# Patient Record
Sex: Female | Born: 1935 | Race: White | Hispanic: No | Smoking: Never smoker
Health system: Southern US, Community
[De-identification: ages and names within clinical notes are randomized; demographics above are authoritative.]

## PROBLEM LIST (undated history)

## (undated) DIAGNOSIS — Z8489 Family history of other specified conditions: Secondary | ICD-10-CM

## (undated) DIAGNOSIS — M1990 Unspecified osteoarthritis, unspecified site: Secondary | ICD-10-CM

## (undated) DIAGNOSIS — I10 Essential (primary) hypertension: Secondary | ICD-10-CM

## (undated) DIAGNOSIS — Z9889 Other specified postprocedural states: Secondary | ICD-10-CM

## (undated) DIAGNOSIS — R112 Nausea with vomiting, unspecified: Secondary | ICD-10-CM

## (undated) DIAGNOSIS — Z98811 Dental restoration status: Secondary | ICD-10-CM

## (undated) DIAGNOSIS — G5602 Carpal tunnel syndrome, left upper limb: Secondary | ICD-10-CM

## (undated) DIAGNOSIS — IMO0001 Reserved for inherently not codable concepts without codable children: Secondary | ICD-10-CM

## (undated) DIAGNOSIS — I700 Atherosclerosis of aorta: Secondary | ICD-10-CM

## (undated) HISTORY — PX: SHOULDER ARTHROSCOPY W/ ROTATOR CUFF REPAIR: SHX2400

## (undated) HISTORY — PX: TONSILLECTOMY: SUR1361

## (undated) HISTORY — PX: APPENDECTOMY: SHX54

## (undated) HISTORY — PX: EXTERNAL EAR SURGERY: SHX627

---

## 1998-09-07 ENCOUNTER — Other Ambulatory Visit: Admission: RE | Admit: 1998-09-07 | Discharge: 1998-09-07 | Payer: Self-pay | Admitting: Family Medicine

## 1999-03-08 ENCOUNTER — Ambulatory Visit (HOSPITAL_COMMUNITY): Admission: RE | Admit: 1999-03-08 | Discharge: 1999-03-08 | Payer: Self-pay | Admitting: Family Medicine

## 1999-03-08 ENCOUNTER — Encounter: Payer: Self-pay | Admitting: Family Medicine

## 2000-01-02 ENCOUNTER — Encounter: Admission: RE | Admit: 2000-01-02 | Discharge: 2000-01-02 | Payer: Self-pay | Admitting: Family Medicine

## 2000-01-02 ENCOUNTER — Encounter: Payer: Self-pay | Admitting: Family Medicine

## 2000-03-07 ENCOUNTER — Other Ambulatory Visit: Admission: RE | Admit: 2000-03-07 | Discharge: 2000-03-07 | Payer: Self-pay | Admitting: Family Medicine

## 2000-09-25 ENCOUNTER — Other Ambulatory Visit: Admission: RE | Admit: 2000-09-25 | Discharge: 2000-09-25 | Payer: Self-pay | Admitting: Obstetrics and Gynecology

## 2001-01-20 ENCOUNTER — Encounter: Admission: RE | Admit: 2001-01-20 | Discharge: 2001-01-20 | Payer: Self-pay | Admitting: Family Medicine

## 2001-01-20 ENCOUNTER — Encounter: Payer: Self-pay | Admitting: Family Medicine

## 2002-08-11 ENCOUNTER — Encounter: Admission: RE | Admit: 2002-08-11 | Discharge: 2002-08-11 | Payer: Self-pay | Admitting: Obstetrics and Gynecology

## 2002-08-11 ENCOUNTER — Encounter: Payer: Self-pay | Admitting: Obstetrics and Gynecology

## 2002-11-08 ENCOUNTER — Inpatient Hospital Stay (HOSPITAL_COMMUNITY): Admission: RE | Admit: 2002-11-08 | Discharge: 2002-11-10 | Payer: Self-pay | Admitting: Obstetrics and Gynecology

## 2002-11-08 ENCOUNTER — Encounter (INDEPENDENT_AMBULATORY_CARE_PROVIDER_SITE_OTHER): Payer: Self-pay | Admitting: Specialist

## 2002-11-08 HISTORY — PX: LAPAROSCOPIC LYSIS OF ADHESIONS: SHX5905

## 2002-11-08 HISTORY — PX: LAPAROSCOPIC ASSISTED VAGINAL HYSTERECTOMY: SHX5398

## 2002-11-08 HISTORY — PX: ANTERIOR AND POSTERIOR VAGINAL REPAIR: SUR5

## 2002-11-08 HISTORY — PX: LAPAROSCOPIC BILATERAL SALPINGO OOPHERECTOMY: SHX5890

## 2003-09-16 ENCOUNTER — Other Ambulatory Visit: Admission: RE | Admit: 2003-09-16 | Discharge: 2003-09-16 | Payer: Self-pay | Admitting: Obstetrics and Gynecology

## 2005-04-26 ENCOUNTER — Encounter: Admission: RE | Admit: 2005-04-26 | Discharge: 2005-04-26 | Payer: Self-pay | Admitting: Family Medicine

## 2005-08-26 ENCOUNTER — Encounter: Admission: RE | Admit: 2005-08-26 | Discharge: 2005-08-26 | Payer: Self-pay | Admitting: Family Medicine

## 2005-10-28 ENCOUNTER — Other Ambulatory Visit: Admission: RE | Admit: 2005-10-28 | Discharge: 2005-10-28 | Payer: Self-pay | Admitting: Obstetrics and Gynecology

## 2007-04-06 ENCOUNTER — Emergency Department (HOSPITAL_COMMUNITY): Admission: EM | Admit: 2007-04-06 | Discharge: 2007-04-06 | Payer: Self-pay | Admitting: Emergency Medicine

## 2008-01-05 ENCOUNTER — Encounter: Admission: RE | Admit: 2008-01-05 | Discharge: 2008-01-05 | Payer: Self-pay | Admitting: Family Medicine

## 2008-10-17 ENCOUNTER — Encounter: Admission: RE | Admit: 2008-10-17 | Discharge: 2008-10-17 | Payer: Self-pay | Admitting: Family Medicine

## 2009-05-12 ENCOUNTER — Encounter
Admission: RE | Admit: 2009-05-12 | Discharge: 2009-05-12 | Payer: Self-pay | Admitting: Physical Medicine and Rehabilitation

## 2009-05-17 ENCOUNTER — Encounter: Admission: RE | Admit: 2009-05-17 | Discharge: 2009-05-17 | Payer: Self-pay | Admitting: Family Medicine

## 2010-03-19 ENCOUNTER — Inpatient Hospital Stay (HOSPITAL_COMMUNITY): Admission: RE | Admit: 2010-03-19 | Discharge: 2010-03-20 | Payer: Self-pay | Admitting: Neurosurgery

## 2010-03-19 HISTORY — PX: LUMBAR FUSION: SHX111

## 2010-03-19 HISTORY — PX: LUMBAR LAMINECTOMY/DECOMPRESSION MICRODISCECTOMY: SHX5026

## 2010-04-18 ENCOUNTER — Encounter: Admission: RE | Admit: 2010-04-18 | Discharge: 2010-04-18 | Payer: Self-pay | Admitting: Neurosurgery

## 2010-06-14 ENCOUNTER — Encounter: Admission: RE | Admit: 2010-06-14 | Discharge: 2010-06-14 | Payer: Self-pay | Admitting: Neurosurgery

## 2010-11-22 ENCOUNTER — Encounter
Admission: RE | Admit: 2010-11-22 | Discharge: 2010-11-22 | Payer: Self-pay | Source: Home / Self Care | Attending: Neurosurgery | Admitting: Neurosurgery

## 2010-12-24 ENCOUNTER — Encounter: Payer: Self-pay | Admitting: Family Medicine

## 2011-01-09 ENCOUNTER — Ambulatory Visit (HOSPITAL_BASED_OUTPATIENT_CLINIC_OR_DEPARTMENT_OTHER)
Admission: RE | Admit: 2011-01-09 | Discharge: 2011-01-09 | Disposition: A | Discharge status: Home / Self Care | Payer: MEDICARE | Source: Ambulatory Visit | Attending: Specialist | Admitting: Specialist

## 2011-01-09 DIAGNOSIS — Z01812 Encounter for preprocedural laboratory examination: Secondary | ICD-10-CM | POA: Insufficient documentation

## 2011-01-09 DIAGNOSIS — H269 Unspecified cataract: Secondary | ICD-10-CM | POA: Insufficient documentation

## 2011-01-09 HISTORY — PX: CATARACT EXTRACTION W/ INTRAOCULAR LENS IMPLANT: SHX1309

## 2011-01-09 LAB — POCT I-STAT 4, (NA,K, GLUC, HGB,HCT)
Glucose, Bld: 88 mg/dL (ref 70–99)
HCT: 40 % (ref 36.0–46.0)
Hemoglobin: 13.6 g/dL (ref 12.0–15.0)
Potassium: 3.9 mEq/L (ref 3.5–5.1)
Sodium: 139 mEq/L (ref 135–145)

## 2011-02-20 LAB — DIFFERENTIAL
Basophils Absolute: 0 10*3/uL (ref 0.0–0.1)
Basophils Relative: 0 % (ref 0–1)
Eosinophils Absolute: 0.1 10*3/uL (ref 0.0–0.7)
Eosinophils Relative: 1 % (ref 0–5)
Lymphocytes Relative: 19 % (ref 12–46)
Lymphs Abs: 1.5 10*3/uL (ref 0.7–4.0)
Monocytes Absolute: 0.8 10*3/uL (ref 0.1–1.0)
Monocytes Relative: 10 % (ref 3–12)
Neutro Abs: 5.5 10*3/uL (ref 1.7–7.7)
Neutrophils Relative %: 70 % (ref 43–77)

## 2011-02-20 LAB — ABO/RH: ABO/RH(D): O POS

## 2011-02-20 LAB — BASIC METABOLIC PANEL
BUN: 14 mg/dL (ref 6–23)
CO2: 29 mEq/L (ref 19–32)
Calcium: 9.8 mg/dL (ref 8.4–10.5)
Chloride: 103 mEq/L (ref 96–112)
Creatinine, Ser: 0.73 mg/dL (ref 0.4–1.2)
GFR calc Af Amer: >60 mL/min (ref >60)
GFR calc non Af Amer: >60 mL/min (ref >60)
Glucose, Bld: 87 mg/dL (ref 70–99)
Potassium: 4.5 mEq/L (ref 3.5–5.1)
Sodium: 138 mEq/L (ref 135–145)

## 2011-02-20 LAB — CBC
HCT: 41 % (ref 36.0–46.0)
Hemoglobin: 14.1 g/dL (ref 12.0–15.0)
MCHC: 34.5 g/dL (ref 30.0–36.0)
MCV: 93.4 fL (ref 78.0–100.0)
Platelets: 234 10*3/uL (ref 150–400)
RBC: 4.38 MIL/uL (ref 3.87–5.11)
RDW: 13.2 % (ref 11.5–15.5)
WBC: 7.9 10*3/uL (ref 4.0–10.5)

## 2011-02-20 LAB — SURGICAL PCR SCREEN
MRSA, PCR: NEGATIVE
Staphylococcus aureus: NEGATIVE

## 2011-02-20 LAB — TYPE AND SCREEN
ABO/RH(D): O POS
Antibody Screen: NEGATIVE

## 2011-02-20 LAB — PROTIME-INR
INR: 1.02 (ref 0.00–1.49)
Prothrombin Time: 13.3 seconds (ref 11.6–15.2)

## 2011-02-20 LAB — APTT: aPTT: 29 seconds (ref 24–37)

## 2011-03-06 ENCOUNTER — Ambulatory Visit (HOSPITAL_BASED_OUTPATIENT_CLINIC_OR_DEPARTMENT_OTHER)
Admission: RE | Admit: 2011-03-06 | Discharge: 2011-03-06 | Disposition: A | Discharge status: Home / Self Care | Payer: MEDICARE | Source: Ambulatory Visit | Attending: Specialist | Admitting: Specialist

## 2011-03-06 DIAGNOSIS — I10 Essential (primary) hypertension: Secondary | ICD-10-CM | POA: Insufficient documentation

## 2011-03-06 DIAGNOSIS — H269 Unspecified cataract: Secondary | ICD-10-CM | POA: Insufficient documentation

## 2011-03-06 HISTORY — PX: CATARACT EXTRACTION W/ INTRAOCULAR LENS IMPLANT: SHX1309

## 2011-04-19 NOTE — Op Note (Signed)
Laurie Galloway, MOSTELLER [facility]                          ACCOUNT NO.:  [id number]   MEDICAL RECORD NO.:  [id number]                   PATIENT TYPE:  INP   LOCATION:  9399                                 FACILITY:  [facility]   PHYSICIAN:  Guy Sandifer. Arleta Creek, M.D.           DATE OF BIRTH:  July 08, 1930   DATE OF PROCEDURE:  11/08/2002  DATE OF DISCHARGE:                                 OPERATIVE REPORT   PREOPERATIVE DIAGNOSES:  Pelvic relaxation.   POSTOPERATIVE DIAGNOSES:  1. Pelvic relaxation.  2. Adhesions.   PROCEDURE:  Laparoscopically assisted vaginal hysterectomy with bilateral  salpingo-oophorectomy, anterior-posterior vaginal repair, and lysis of  adhesions.   SURGEON:  Guy Sandifer. Henderson Cloud, M.D.   ASSISTANT:  Duke Salvia. Marcelle Overlie, M.D.   ANESTHESIA:  General endotracheal intubation.   ESTIMATED BLOOD LOSS:  200 cc.   SPECIMENS:  Uterus, fallopian tubes, and ovaries bilaterally, less than 250  gm.   INDICATIONS AND CONSENT:  This patient is a 77 year old married white  female, G1, P1, with symptomatic pelvic relaxation.  Details have been  dictated in the history and physical.  Laparoscopically assisted vaginal  hysterectomy with bilateral salpingo-oophorectomy and anterior and posterior  vaginal repair has been discussed with the patient.  The potential risks and  complications have been discussed with the patient preoperatively, including  but not limited to infection, bowel, bladder, and ureteral damage, bleeding  requiring transfusion of blood products with possible transfusion reaction,  hepatitis acquisition, DVT, PE, pneumonia, laparotomy, fistula formation,  postoperative dyspareunia, and recurrence of pelvic relaxation.  All  questions have been answered, and consent is signed on the chart.   FINDINGS:  The upper abdomen is grossly normal.  There is an adhesion of the  epiploicae of the bowel in the right lower quadrant at the anterior  abdominal wall.  The uterus is 4-6  weeks in size.  The ovaries and tubes are  normal bilaterally.  Anterior-posterior cul-de-sacs and pelvic side walls  are also without lesion.   PROCEDURE:  The patient is taken to the operating room and placed in the  dorsal supine position.  General anesthesia is induced via endotracheal  intubation.  She is then placed in the dorsolithotomy position where she is  prepped abdominally and vaginally.  The bladder is straight catheterized.  A  Hulka tenaculum was placed in the uterus as a manipulator, and she was  draped in a sterile fashion.  A small infraumbilical incision was made, and  a  10-11 disposable trocar sleeve was placed without difficulty.  Placement  was verified by the laparoscope, and no damage to surrounding structures was  noted.  Pneumoperitoneum was induced.  A small suprapubic incision was made,  and a 5 mm nondisposable trocar sleeve was placed under direct visualization  without difficulty.  The above findings were noted.  The adhesions to the  anterior abdominal wall were taken down.  There were  also some adhesions to  the left pelvic brim of the sigmoid epiploic area which tented the sigmoid  up around the left infundibulopelvic ligament.  These adhesions were also  taken down sharply.  The ureters were noted bilaterally.  The Gyrus cutting  cautery instrument was then used through the operative laparoscope to take  down the infundibulopelvic ligament and the round ligaments bilaterally.  This was carried down to the level of the vesicouterine peritoneum  bilaterally.  The vesicouterine peritoneum was then incised in the midline,  hydrodissected and taken down cephalolaterally.  Good hemostasis was noted.  Attention was turned to the vagina.  The posterior cul-de-sac was entered  sharply without difficulty.  The cervix was circumscribed with a scalpel,  and the mucosa was advanced sharply and bluntly.  The uterosacral ligaments  were taken down bilaterally and  ligated with 0 Monocryl bilaterally and  ligated with 0 Monocryl bilaterally.  Then, the LigaSure instrument was used  to take down the cardinal ligaments and the uterine vessels bilaterally.  The fundus was delivered posteriorly.  The proximal pedicles were taken down  with LigaSure and specimens delivered.  0 Monocryl was then used.  All  sutures were Monocryl unless otherwise designated.  The uterosacral  ligaments were plicated to the vaginal mucosa bilaterally.  The uterosacral  ligaments were then plicated in the midline with two sutures.  The posterior  half of the cuff was then closed with figure-of-eights, and good hemostasis  was noted.  An anterior repair was then carried out by taking down the  anterior vaginal mucosa in the midline and then dissecting from the  underlying bladder bilaterally sharply and bluntly.  There was good support  at the urethrovesical angle.  A single Kelly plication suture was placed  under this angle to further support it.  A pursestring suture was then used  to reduce the cystocele.  The vesicovaginal fascia was then reapproximated  in the midline with interrupted sutures.  The excess mucosa was trimmed.  0  Monocryl was used to close the anterior half of the vaginal cuff.  The  anterior vaginal mucosa was closed in a running locking fashion with 2-0  Monocryl sutures.  Attention was then turned posteriorly.  A diamond-shaped  wedge of tissue was removed from the posterior perineal body.  The posterior  vaginal mucosa was then dissected from the underlying rectum in the midline.  This was taken bilaterally sharply and bluntly.  The rectovaginal fascia was  reapproximated in the midline with interrupted sutures.  Excess mucosa was  trimmed, and a 2-0 Monocryl locking suture was then run to close the  posterior vaginal mucosa down to the level of the introitus. The perineal  body was then dissected up bilaterally, reapproximated with  interrupted figure-of-eight 0 Monocryl sutures.  The mucosal sutures were then continued  on down, and the perineum was closed in a standard episiotomy-type fashion.  A Foley catheter was then placed in the bladder, and clear urine was noted.  The bladder was completely drained and then filled retrograde with  approximately 360 cc of saline.  A suprapubic Bonnano catheter was then  placed on the first attempt with positive aspiration of saline, both with  and without the stylet in place.  The bladder was drained while the Bonnano  catheter was sutured in place with nylon suture.  The Foley catheter was  removed.  The vagina was packed with one-inch Iodoform gauze.  Attention was  then turned to  the abdomen.  Copious irrigation was carried out, and  hemostasis was noted.  Pneumoperitoneum was reduced, and continued good  hemostasis was noted.  The trocar sleeves were removed.  The umbilical  incision was closed with a 0 Monocryl suture in the subcutaneous layers with  care being taken not to pick up the underlying structures.  The incisions  were injected with 0.5% plain Marcaine, and the skin was closed with  Dermabond umbilically and suprapubically.  All counts were correct.  The  patient was awakened and taken to the recovery room in stable condition.                                               Guy Sandifer Arleta Creek, M.D.    JET/MEDQ  D:  11/08/2002  T:  11/08/2002  Job:  [id number]

## 2011-04-19 NOTE — Discharge Summary (Signed)
[name], Laurie Galloway [facility]                          ACCOUNT NO.:  [id number]   MEDICAL RECORD NO.:  [id number]                   PATIENT TYPE:  INP   LOCATION:  9304                                 FACILITY:  [facility]   PHYSICIAN:  Guy Sandifer. Arleta Creek, M.D.           DATE OF BIRTH:  Jun 07, 1930   DATE OF ADMISSION:  11/08/2002  DATE OF DISCHARGE:  11/10/2002                                 DISCHARGE SUMMARY   ADMITTING DIAGNOSES:  Pelvic relaxation.   DISCHARGE DIAGNOSES:  1. Pelvic relaxation.  2. Adhesions.   PROCEDURE:  1. On November 08, 2002 laparoscopically assisted vaginal hysterectomy.  2. Bilateral salpingo-oophorectomy.  3. Anterior/posterior vaginal repair.  4. Lysis of adhesions.   REASON FOR ADMISSION:  The patient is a 77 year old married white female G1,  P1 with symptomatic pelvic relaxation.  Details are dictated in the history  and physical.  She is admitted for surgical management.   HOSPITAL COURSE:  The patient was taken to the operating room.  Undergoes  the above procedure without complications.  Estimated blood loss is 200 cc.  On the evening of surgery she has good pain relief.  She vomited once after  clear liquids, but otherwise was without nausea.  Vital signs were stable.  She was afebrile with clear urine output.  Abdomen was soft with good bowel  sounds.  On the first postoperative day she was passing flatus and  ambulating.  She remained afebrile with stable vital signs.  Abdomen  remained soft.  The vaginal pack was removed.  White count was 9.5,  hemoglobin 10.8, platelet count 184,000.  The patient was continued on IV  Ancef.  On the day of discharge she is voiding with residual urine less than  or equal to 75 cc.  She remains afebrile.  Abdomen remains soft.  Suprapubic  Bonanno catheter is removed intact.  Pathology is pending at the time of  dictation.   CONDITION ON DISCHARGE:  Good.   DIET:  Regular, as tolerated.   ACTIVITY:  No lifting.  No  operation of automobiles.  No vaginal entry.  She  is to call the office for problems including, but not limited to, heavy  vaginal bleeding, increasing pain, persistent nausea or vomiting, or  elevated temperature.  She is instructed on double voiding.   MEDICATIONS:  1. Percocet 5/325 mg number 20 one to two p.o. q.6h. p.r.n.  2. Ibuprofen 600 mg p.o. q.6h. p.r.n.  3. She is instructed not to take her Celebrex on the days she takes her     ibuprofen.  4. Multivitamins daily.  5.     Colace daily for two to three weeks.  6. Benefiber daily for two to three weeks.  She is to resume her     antihypertensive medications as well.   Follow-up is in the office in two weeks.  Guy Sandifer Arleta Creek, M.D.    JET/MEDQ  D:  11/10/2002  T:  11/10/2002  Job:  [id number]

## 2011-04-19 NOTE — H&P (Signed)
Laurie Galloway, [facility]ay [facility]                          ACCOUNT NO.:  [id number]   MEDICAL RECORD NO.:  [id number]                   PATIENT TYPE:  INP   LOCATION:  NA                                   FACILITY[facility]   PHYSICIAN:  Guy Sandifer. Arleta Creek, M.D.           DATE OF BIRTH:  [birth year]   DATE OF ADMISSION:  11/08/2002  DATE OF DISCHARGE:                                HISTORY & PHYSICAL   CHIEF COMPLAINT:  It feels like something is falling out.   HISTORY OF PRESENT ILLNESS:  This patient is a 77 year old married white  female G1, P1 with symptomatic pelvic relaxation.  She has increasing  symptoms of a pulling sensation in the lower back as well as the right lower  quadrant.  This is exacerbated by any kind of lifting or coughing.  She has  the distinct sensation that things are falling from the vagina.  On  examination she has a cystocele and rectocele at the vaginal introitus.  Options of management have been discussed and the patient wants surgical  correction.  She also wants to assure the ovaries are removed.  Laparoscopic  assisted vaginal hysterectomy with bilateral salpingo-oophorectomy and  anterior posterior vaginal repair have been discussed.  Potential risks and  complications have been discussed preoperatively.   PAST MEDICAL HISTORY:  1. Urinary tract infection with a urine culture growing greater than 100,000     colonies of E coli on October 18, 2002.  This was sensitive to all     antibiotics tested.  She was treated at that time with Macrobid.  A     repeat urine culture on November 01, 2002 again grew greater than 100,000     colonies of E coli.  The patient was treated at that time with Cipro,     which was also indicated no the sensitivities.  2. Fracture of the left wrist approximately nine weeks ago secondary to a     fall.  3. Chronic hypertension.  4. Constipation.  5. Superficial varicosities, bilateral lower extremities.  6. Contact dermatitis, left  nipple areolar complex, with negative mammogram     and ultrasound on September 10, 2002.  7. Osteopenia.   PAST SURGICAL HISTORY:  1. Appendectomy.  2. Bilateral heel fractures.   MEDICATIONS:  1. Multivitamin daily.  2. Estrace vaginal cream two times a week.  3. Fosamax weekly.  4. Verapamil 240 mg daily.  5. Celebrex daily.   ALLERGIES:  SULFA leading to itching and rash.   FAMILY HISTORY:  Breast cancer paternal grandmother.  Chronic hypertension  sister.  Heart disease and asthma in mother.  Kidney stones in father.  Parkinson's disease in mother.  Hyperthyroidism in sister.  Pernicious  anemia in mother.   SOCIAL HISTORY:  The patient is retired.  Denies tobacco, alcohol or drug  abuse.   REVIEW OF SYSTEMS:  Review of systems is negative,  except as above.   PHYSICAL EXAMINATION:  VITAL SIGNS:  Height 5 feet 4 inches.  Weight 160  pounds.  Blood pressure 146/78.  HEENT AND NECK:  Without thyromegaly.  LUNGS:  Clear to auscultation.  HEART:  Regular rate and rhythm.  BACK:  Without CVA tenderness.  BREASTS:  Without masses, retraction or discharge.  ABDOMEN:  Soft and nontender without masses.  PELVIC EXAMINATION:  Vulva, vagina and cervix without lesions.  There is a  cystocele and rectocele presenting at the vaginal introitus.  Uterus is  normal size, extremely mobile, with at least first degree prolapse.  Adnexa  nontender without masses.  RECTAL EXAMINATION:  Consistent with above.  EXTREMITIES:  Superficial varicosities of the lower extremities bilaterally.  NEUROLOGIC EXAMINATION:  Grossly intact.   ASSESSMENT:  Symptomatic pelvic relaxation.   PLAN:  Laparoscopic assisted vaginal hysterectomy with bilateral salpingo-  oophorectomy, anterior posterior vaginal repair.                                                 Guy Sandifer Arleta Creek, M.D.    JET/MEDQ  D:  11/07/2002  T:  11/08/2002  Job:  [id number]

## 2011-06-28 NOTE — Op Note (Signed)
  Laurie Galloway, Laurie Galloway                  ACCOUNT NO.:  [id number]  MEDICAL RECORD NO.:  [id number]          PATIENT TYPE:  LOCATION:                                 FACILITY:  PHYSICIAN:  Chucky May, M.D.  DATE OF BIRTH:  November 10, 1930  DATE OF PROCEDURE: DATE OF DISCHARGE:                              OPERATIVE REPORT   SURGEON:  Chucky May, M.D.  ANESTHESIA:  MAC.  The outpatient setting is the appropriate setting for this procedure.  INDICATIONS FOR SURGERY:  The patient is a 77 year old female with painless progressive decrease in vision, so that she has difficulty seeing or reading.  PROCEDURE:  The patient was taken to the [facility] Laser Room and placed in the supine position.  The patient interface was placed over the cornea in proper position and once suction had been obtained and the laser incision was planned, a laser capsulorrhexis was performed without difficulty.  The nucleus was emulsified by the laser and incisions were made for later entry into the anterior chamber temporally with an additional port superiorly.  The patient was then moved to the main operating room and placed in the supine position where she was prepped and draped in the usual manner.  A lid speculum was inserted and the cornea was irrigated with balanced salt solution and 4% lidocaine drops. The side port incision was opened with a Sinskey hook and viscoelastic was instilled into the anterior chamber.  The primary incision temporally was then opened with a Sinskey hook and additional viscoelastic was instilled into the anterior chamber.  The anterior capsular flap was removed without difficulty and the nucleus was then mobilized by hydrodissection with 1% nonpreserved lidocaine followed by phacoemulsification of the nucleus.  The posterior capsule was polished and a model [id number], 20.5 diopter ReSTOR intraocular lens was placed in the bag without difficulty.  Viscoelastic was removed by  irrigation and aspiration and replaced with balanced salt solution.  The wounds were then hydrated with balanced salt solution and checked for fluid leaks and none were noted. Topical Vigamox and Pred Forte were then instilled onto the ocular surface and a Fox shield was placed over the eye.  The patient was taken to the recovery room in excellent condition where she received written and verbal instructions for her care and was scheduled for her followup appointment.          ______________________________ Chucky May, M.D.     DJD/MEDQ  D:  03/08/2011  T:  03/08/2011  Job:  [id number]  Electronically Signed by Nelson Chimes M.D. on 06/28/2011 08:40:07 AM

## 2011-06-28 NOTE — Op Note (Signed)
  NAMEAUTUM, BENFER [facility]              ACCOUNT NO.:  [id number]  MEDICAL RECORD NO.:  [id number]           PATIENT TYPE:  LOCATION:                                 FACILITY:  PHYSICIAN:  Chucky May, M.D.       DATE OF BIRTH:  DATE OF PROCEDURE:  01/09/2011 DATE OF DISCHARGE:                              OPERATIVE REPORT   PREOPERATIVE DIAGNOSIS:  Cataract, left eye.  POSTOPERATIVE DIAGNOSIS:  Cataract, left eye.  OPERATION PERFORMED:  Cataract extraction with intraocular lens implantation with capsulorrhexis and lens emulsification by LenSx laser. The patient was brought to the laser room where topical anesthesia was obtained by means of tetracaine drops.  A lid speculum was inserted, and the patient interface was placed on the eye without difficulty.  A primary and secondary incision was then made along with a capsulorrhexis and emulsification of the nucleus without difficulty.  The patient was then taken to the main operating room where she was prepped and draped in usual manner.  A lid speculum was inserted and the primary and secondary incisions were opened by sharp dissection.  Viscoat was instilled into the anterior chamber.  The capsulorrhexis flap was inspected and noted to be complete, so was removed by capsulorrhexis forceps.  The nucleus was hydrodissected by 1% topical lidocaine and then emulsified by the phacoemulsification.  Residual cortical material was removed by irrigation and aspiration.  Posterior capsule was polished and a posterior chamber lens implant was placed without difficulty.  The implant was a [id number] diopter power restored by Alcon. Viscoelastic was then removed and replaced with balanced salt solution. The wounds were hydrated with balanced salt solution and checked for fluid leaks and none were noted.  The patient was taken to the recovery room in excellent condition where she received written and verbal instructions along with her family for  her postoperative care and was scheduled for a followup appointment in 24 hours.          ______________________________ Chucky May, M.D.     DJD/MEDQ  D:  01/10/2011  T:  01/10/2011  Job:  [id number]  Electronically Signed by Nelson Chimes M.D. on 06/28/2011 08:39:36 AM

## 2011-09-05 ENCOUNTER — Other Ambulatory Visit: Payer: Self-pay | Admitting: Family Medicine

## 2011-09-05 DIAGNOSIS — Z1231 Encounter for screening mammogram for malignant neoplasm of breast: Secondary | ICD-10-CM

## 2011-09-20 ENCOUNTER — Ambulatory Visit
Admission: RE | Admit: 2011-09-20 | Discharge: 2011-09-20 | Disposition: A | Discharge status: Home / Self Care | Payer: Medicare Other | Source: Ambulatory Visit | Attending: Family Medicine | Admitting: Family Medicine

## 2011-09-20 DIAGNOSIS — Z1231 Encounter for screening mammogram for malignant neoplasm of breast: Secondary | ICD-10-CM

## 2012-01-07 ENCOUNTER — Other Ambulatory Visit: Payer: Self-pay

## 2012-01-07 ENCOUNTER — Encounter (HOSPITAL_COMMUNITY): Payer: Self-pay | Admitting: *Deleted

## 2012-01-07 ENCOUNTER — Inpatient Hospital Stay (HOSPITAL_COMMUNITY)
Admission: EM | Admit: 2012-01-07 | Discharge: 2012-01-14 | DRG: 329 | Disposition: A | Discharge status: Home Health | Payer: Medicare Other | Attending: General Surgery | Admitting: General Surgery

## 2012-01-07 ENCOUNTER — Emergency Department (HOSPITAL_COMMUNITY): Payer: Medicare Other

## 2012-01-07 DIAGNOSIS — R1013 Epigastric pain: Secondary | ICD-10-CM

## 2012-01-07 DIAGNOSIS — N179 Acute kidney failure, unspecified: Secondary | ICD-10-CM | POA: Diagnosis not present

## 2012-01-07 DIAGNOSIS — I10 Essential (primary) hypertension: Secondary | ICD-10-CM

## 2012-01-07 DIAGNOSIS — Z66 Do not resuscitate: Secondary | ICD-10-CM | POA: Diagnosis present

## 2012-01-07 DIAGNOSIS — E876 Hypokalemia: Secondary | ICD-10-CM | POA: Diagnosis not present

## 2012-01-07 DIAGNOSIS — G8929 Other chronic pain: Secondary | ICD-10-CM

## 2012-01-07 DIAGNOSIS — M549 Dorsalgia, unspecified: Secondary | ICD-10-CM | POA: Diagnosis present

## 2012-01-07 DIAGNOSIS — R9431 Abnormal electrocardiogram [ECG] [EKG]: Secondary | ICD-10-CM | POA: Diagnosis present

## 2012-01-07 DIAGNOSIS — D62 Acute posthemorrhagic anemia: Secondary | ICD-10-CM | POA: Diagnosis present

## 2012-01-07 DIAGNOSIS — K922 Gastrointestinal hemorrhage, unspecified: Secondary | ICD-10-CM

## 2012-01-07 DIAGNOSIS — K56609 Unspecified intestinal obstruction, unspecified as to partial versus complete obstruction: Secondary | ICD-10-CM

## 2012-01-07 DIAGNOSIS — E875 Hyperkalemia: Secondary | ICD-10-CM | POA: Diagnosis not present

## 2012-01-07 DIAGNOSIS — D3500 Benign neoplasm of unspecified adrenal gland: Secondary | ICD-10-CM | POA: Diagnosis present

## 2012-01-07 DIAGNOSIS — D72829 Elevated white blood cell count, unspecified: Secondary | ICD-10-CM | POA: Diagnosis not present

## 2012-01-07 DIAGNOSIS — R000 Tachycardia, unspecified: Secondary | ICD-10-CM

## 2012-01-07 DIAGNOSIS — K560 Paralytic ileus: Secondary | ICD-10-CM | POA: Diagnosis not present

## 2012-01-07 DIAGNOSIS — K559 Vascular disorder of intestine, unspecified: Secondary | ICD-10-CM | POA: Diagnosis present

## 2012-01-07 DIAGNOSIS — M129 Arthropathy, unspecified: Secondary | ICD-10-CM | POA: Diagnosis present

## 2012-01-07 DIAGNOSIS — I2584 Coronary atherosclerosis due to calcified coronary lesion: Secondary | ICD-10-CM | POA: Diagnosis present

## 2012-01-07 DIAGNOSIS — I2510 Atherosclerotic heart disease of native coronary artery without angina pectoris: Secondary | ICD-10-CM | POA: Diagnosis present

## 2012-01-07 DIAGNOSIS — K659 Peritonitis, unspecified: Secondary | ICD-10-CM | POA: Diagnosis present

## 2012-01-07 HISTORY — DX: Epigastric pain: R10.13

## 2012-01-07 HISTORY — DX: Essential (primary) hypertension: I10

## 2012-01-07 HISTORY — DX: Other specified postprocedural states: Z98.890

## 2012-01-07 HISTORY — DX: Gastrointestinal hemorrhage, unspecified: K92.2

## 2012-01-07 HISTORY — DX: Unspecified intestinal obstruction, unspecified as to partial versus complete obstruction: K56.609

## 2012-01-07 HISTORY — DX: Other specified postprocedural states: R11.2

## 2012-01-07 HISTORY — DX: Tachycardia, unspecified: R00.0

## 2012-01-07 HISTORY — DX: Other chronic pain: G89.29

## 2012-01-07 LAB — URINALYSIS, ROUTINE W REFLEX MICROSCOPIC
Bilirubin Urine: NEGATIVE
Glucose, UA: NEGATIVE mg/dL
Hgb urine dipstick: NEGATIVE
Ketones, ur: 40 mg/dL — AB
Leukocytes, UA: NEGATIVE
Nitrite: NEGATIVE
Protein, ur: NEGATIVE mg/dL
Specific Gravity, Urine: 1.025 (ref 1.005–1.030)
Urobilinogen, UA: 0.2 mg/dL (ref 0.0–1.0)
pH: 6 (ref 5.0–8.0)

## 2012-01-07 LAB — DIFFERENTIAL
Basophils Absolute: 0 10*3/uL (ref 0.0–0.1)
Basophils Relative: 0 % (ref 0–1)
Eosinophils Absolute: 0 10*3/uL (ref 0.0–0.7)
Eosinophils Relative: 0 % (ref 0–5)
Lymphocytes Relative: 10 % — ABNORMAL LOW (ref 12–46)
Lymphs Abs: 1 10*3/uL (ref 0.7–4.0)
Monocytes Absolute: 0.3 10*3/uL (ref 0.1–1.0)
Monocytes Relative: 3 % (ref 3–12)
Neutro Abs: 8.5 10*3/uL — ABNORMAL HIGH (ref 1.7–7.7)
Neutrophils Relative %: 87 % — ABNORMAL HIGH (ref 43–77)

## 2012-01-07 LAB — COMPREHENSIVE METABOLIC PANEL
ALT: 27 U/L (ref 0–35)
AST: 30 U/L (ref 0–37)
Albumin: 4.1 g/dL (ref 3.5–5.2)
Alkaline Phosphatase: 88 U/L (ref 39–117)
BUN: 20 mg/dL (ref 6–23)
CO2: 20 mEq/L (ref 19–32)
Calcium: 10.3 mg/dL (ref 8.4–10.5)
Chloride: 99 mEq/L (ref 96–112)
Creatinine, Ser: 0.57 mg/dL (ref 0.50–1.10)
GFR calc Af Amer: >90 mL/min (ref >90)
GFR calc non Af Amer: 86 mL/min — ABNORMAL LOW (ref >90)
Glucose, Bld: 191 mg/dL — ABNORMAL HIGH (ref 70–99)
Potassium: 3.4 mEq/L — ABNORMAL LOW (ref 3.5–5.1)
Sodium: 137 mEq/L (ref 135–145)
Total Bilirubin: 0.6 mg/dL (ref 0.3–1.2)
Total Protein: 7.3 g/dL (ref 6.0–8.3)

## 2012-01-07 LAB — CBC
HCT: 40.1 % (ref 36.0–46.0)
HCT: 44.4 % (ref 36.0–46.0)
Hemoglobin: 13.4 g/dL (ref 12.0–15.0)
Hemoglobin: 14.5 g/dL (ref 12.0–15.0)
MCH: 28.7 pg (ref 26.0–34.0)
MCH: 28.7 pg (ref 26.0–34.0)
MCHC: 33.4 g/dL (ref 30.0–36.0)
MCHC: 32.7 g/dL (ref 30.0–36.0)
MCV: 85.9 fL (ref 78.0–100.0)
MCV: 87.7 fL (ref 78.0–100.0)
Platelets: 302 10*3/uL (ref 150–400)
Platelets: 315 10*3/uL (ref 150–400)
RBC: 4.67 MIL/uL (ref 3.87–5.11)
RBC: 5.06 MIL/uL (ref 3.87–5.11)
RDW: 13 % (ref 11.5–15.5)
RDW: 13.2 % (ref 11.5–15.5)
WBC: 9.7 10*3/uL (ref 4.0–10.5)
WBC: 14.3 10*3/uL — ABNORMAL HIGH (ref 4.0–10.5)

## 2012-01-07 LAB — MAGNESIUM: Magnesium: 1.7 mg/dL (ref 1.5–2.5)

## 2012-01-07 LAB — LIPASE, BLOOD: Lipase: 23 U/L (ref 11–59)

## 2012-01-07 LAB — PREPARE RBC (CROSSMATCH)

## 2012-01-07 LAB — APTT: aPTT: 26 seconds (ref 24–37)

## 2012-01-07 LAB — PROTIME-INR
INR: 1.18 (ref 0.00–1.49)
Prothrombin Time: 15.2 seconds (ref 11.6–15.2)

## 2012-01-07 MED ORDER — IOHEXOL 300 MG/ML  SOLN
100.0000 mL | Freq: Once | INTRAMUSCULAR | Status: AC | PRN
  Administered 2012-01-07: 100 mL via INTRAVENOUS

## 2012-01-07 MED ORDER — METOPROLOL TARTRATE 1 MG/ML IV SOLN
2.5000 mg | Freq: Four times a day (QID) | INTRAVENOUS | Status: DC | PRN
  Administered 2012-01-08: 2.5 mg via INTRAVENOUS
  Filled 2012-01-07: qty 5

## 2012-01-07 MED ORDER — MORPHINE SULFATE 4 MG/ML IJ SOLN
4.0000 mg | Freq: Once | INTRAMUSCULAR | Status: AC
  Administered 2012-01-07: 4 mg via INTRAVENOUS
  Filled 2012-01-07: qty 1

## 2012-01-07 MED ORDER — SODIUM CHLORIDE 0.9 % IV SOLN
Freq: Once | INTRAVENOUS | Status: AC
  Administered 2012-01-07: 15:00:00 via INTRAVENOUS

## 2012-01-07 MED ORDER — MORPHINE SULFATE 2 MG/ML IJ SOLN
1.0000 mg | INTRAMUSCULAR | Status: DC | PRN
  Administered 2012-01-07 – 2012-01-08 (×2): 2 mg via INTRAVENOUS
  Administered 2012-01-08: 1 mg via INTRAVENOUS
  Administered 2012-01-08: 2 mg via INTRAVENOUS
  Filled 2012-01-07 (×4): qty 1

## 2012-01-07 MED ORDER — POLYVINYL ALCOHOL 1.4 % OP SOLN
1.0000 [drp] | Freq: Every day | OPHTHALMIC | Status: DC
  Administered 2012-01-09 – 2012-01-14 (×6): 1 [drp] via OPHTHALMIC
  Filled 2012-01-07: qty 15

## 2012-01-07 MED ORDER — PANTOPRAZOLE SODIUM 40 MG IV SOLR
40.0000 mg | Freq: Two times a day (BID) | INTRAVENOUS | Status: DC
  Administered 2012-01-08 – 2012-01-10 (×6): 40 mg via INTRAVENOUS
  Filled 2012-01-07 (×8): qty 40

## 2012-01-07 MED ORDER — POTASSIUM CHLORIDE 10 MEQ/100ML IV SOLN
10.0000 meq | INTRAVENOUS | Status: AC
Start: 2012-01-07 — End: 2012-01-07

## 2012-01-07 MED ORDER — POTASSIUM CHLORIDE IN NACL 40-0.9 MEQ/L-% IV SOLN
INTRAVENOUS | Status: DC
  Administered 2012-01-07: 21:00:00 via INTRAVENOUS
  Filled 2012-01-07 (×4): qty 1000

## 2012-01-07 MED ORDER — PANTOPRAZOLE SODIUM 40 MG IV SOLR
40.0000 mg | Freq: Every day | INTRAVENOUS | Status: DC
  Administered 2012-01-07: 40 mg via INTRAVENOUS
  Filled 2012-01-07: qty 40

## 2012-01-07 MED ORDER — ONDANSETRON HCL 4 MG/2ML IJ SOLN
4.0000 mg | Freq: Four times a day (QID) | INTRAMUSCULAR | Status: DC | PRN
  Administered 2012-01-07 – 2012-01-08 (×2): 4 mg via INTRAVENOUS
  Filled 2012-01-07 (×2): qty 2

## 2012-01-07 MED ORDER — WHITE PETROLATUM GEL
Status: AC
  Administered 2012-01-07: 21:00:00
  Filled 2012-01-07: qty 5

## 2012-01-07 MED ORDER — MORPHINE SULFATE 4 MG/ML IJ SOLN
4.0000 mg | Freq: Once | INTRAMUSCULAR | Status: AC
  Administered 2012-01-07: 4 mg via INTRAVENOUS

## 2012-01-07 MED ORDER — MORPHINE SULFATE 4 MG/ML IJ SOLN
INTRAMUSCULAR | Status: AC
  Administered 2012-01-07: 4 mg via INTRAVENOUS
  Filled 2012-01-07: qty 1

## 2012-01-07 MED ORDER — VERAPAMIL HCL ER 180 MG PO TBCR
360.0000 mg | EXTENDED_RELEASE_TABLET | ORAL | Status: DC
  Filled 2012-01-07: qty 2

## 2012-01-07 MED ORDER — ONDANSETRON HCL 4 MG/2ML IJ SOLN
4.0000 mg | Freq: Once | INTRAMUSCULAR | Status: AC
  Administered 2012-01-07: 4 mg via INTRAVENOUS
  Filled 2012-01-07: qty 2

## 2012-01-07 MED ORDER — ACETAMINOPHEN 650 MG RE SUPP: 650.0000 mg | Freq: Four times a day (QID) | RECTAL | Status: DC | PRN

## 2012-01-07 MED ORDER — ACETAMINOPHEN 325 MG PO TABS: 650.0000 mg | ORAL_TABLET | Freq: Four times a day (QID) | ORAL | Status: DC | PRN

## 2012-01-07 MED ORDER — POLYETHYL GLYCOL-PROPYL GLYCOL 0.4-0.3 % OP SOLN: 1.0000 [drp] | Freq: Every day | OPHTHALMIC | Status: DC

## 2012-01-07 MED ORDER — VERAPAMIL HCL ER 180 MG PO TBCR
360.0000 mg | EXTENDED_RELEASE_TABLET | Freq: Every day | ORAL | Status: DC
  Filled 2012-01-07 (×2): qty 2

## 2012-01-07 NOTE — H&P (Signed)
Laurie Galloway is an 78 y.o. female.   Chief Complaint: Abdominal Pain HPI: Patient is a 78 year old female in good health. Today she up and after breakfast started having pain at the base of the rib cage, and into her abdomen. She took sometimes total 4 Tums, and 1 Gas-ex, without relief. She had a bowel movement, which is normal for her each a.m. She then developed nausea and vomiting. This did not improve and she ultimately presented to the emergency room at [facility]. Her abdominal pain nausea and vomiting had resolved with medications.  Workup in the ER shows a normal WBC. Electrolytes are normal except for a low potassium of 3.4 lipase is 23. LFTs are normal. CT scan shows a stomach and proximal small bowel loops have a normal caliber. The mid small bowel loops are increased in caliber up to 2.7 cm with the terminal ileum collapse. A transition point is noted in the central abdomen. We were asked to evaluate for small bowel obstruction.  Past Medical History  Diagnosis Date  . Back problem   . Hypertension   . PONV (postoperative nausea and vomiting)    chronic back pain on gabapentin Arthritis on tramadol and Celebrex  Past Surgical History  Procedure Date  . Back surgery   . Appendectomy   . Abdominal hysterectomy   . Tonsillectomy     age 49   surgery on both ears.  History reviewed. No pertinent family history. Social History:  reports that she has never smoked. She has never used smokeless tobacco. She reports that she does not drink alcohol or use illicit drugs.  Allergies:  Allergies  Allergen Reactions  . Sulfa Antibiotics     Medications Prior to Admission  Medication Dose Route Frequency Provider Last Rate Last Dose  . 0.9 %  sodium chloride infusion   Intravenous Once Geoffery Lyons, MD 1,000 mL/hr at 01/07/12 1434    . iohexol (OMNIPAQUE) 300 MG/ML solution 100 mL  100 mL Intravenous Once PRN Medication Radiologist, MD   100 mL at 01/07/12 1609  . morphine 4  MG/ML injection 4 mg  4 mg Intravenous Once Geoffery Lyons, MD   4 mg at 01/07/12 1432  . morphine 4 MG/ML injection 4 mg  4 mg Intravenous Once Geoffery Lyons, MD   4 mg at 01/07/12 1449  . ondansetron (ZOFRAN) injection 4 mg  4 mg Intravenous Once Geoffery Lyons, MD   4 mg at 01/07/12 1432   No current outpatient prescriptions on file as of 01/07/2012.   Prior to Admission medications   Medication Sig Start Date End Date Taking? Authorizing Provider  beta carotene w/minerals (OCUVITE) tablet Take 1 tablet by mouth daily.   Yes Historical Provider, MD  calcium-vitamin D (OSCAL WITH D) 500-200 MG-UNIT per tablet Take 1 tablet by mouth daily.   Yes Historical Provider, MD  celecoxib (CELEBREX) 200 MG capsule Take 200 mg by mouth daily.   Yes Historical Provider, MD  gabapentin (NEURONTIN) 300 MG capsule Take 300 mg by mouth 3 (three) times daily.   Yes Historical Provider, MD  losartan (COZAAR) 100 MG tablet Take 100 mg by mouth daily.   Yes Historical Provider, MD  omega-3 acid ethyl esters (LOVAZA) 1 G capsule Take 1 g by mouth daily.   Yes Historical Provider, MD  Polyethyl Glycol-Propyl Glycol (SYSTANE) 0.4-0.3 % SOLN Place 1 drop into both eyes daily.   Yes Historical Provider, MD  traMADol (ULTRAM) 50 MG tablet Take 50 mg  by mouth every 6 (six) hours as needed. TAKE 1-2 TABLETS EVERY 6 HOURS AS NEEDED   Yes Historical Provider, MD  verapamil (VERELAN PM) 360 MG 24 hr capsule Take 360 mg by mouth at bedtime.   Yes Historical Provider, MD   Results for orders placed during the hospital encounter of 01/07/12 (from the past 48 hour(s))  CBC     Status: Normal   Collection Time   01/07/12  2:35 PM      Component Value Range Comment   WBC 9.7  4.0 - 10.5 (K/uL)    RBC 4.67  3.87 - 5.11 (MIL/uL)    Hemoglobin 13.4  12.0 - 15.0 (g/dL)    HCT 40.9  81.1 - 91.4 (%)    MCV 85.9  78.0 - 100.0 (fL)    MCH 28.7  26.0 - 34.0 (pg)    MCHC 33.4  30.0 - 36.0 (g/dL)    RDW 78.2  95.6 - 21.3 (%)    Platelets  302  150 - 400 (K/uL)   DIFFERENTIAL     Status: Abnormal   Collection Time   01/07/12  2:35 PM      Component Value Range Comment   Neutrophils Relative 87 (*) 43 - 77 (%)    Neutro Abs 8.5 (*) 1.7 - 7.7 (K/uL)    Lymphocytes Relative 10 (*) 12 - 46 (%)    Lymphs Abs 1.0  0.7 - 4.0 (K/uL)    Monocytes Relative 3  3 - 12 (%)    Monocytes Absolute 0.3  0.1 - 1.0 (K/uL)    Eosinophils Relative 0  0 - 5 (%)    Eosinophils Absolute 0.0  0.0 - 0.7 (K/uL)    Basophils Relative 0  0 - 1 (%)    Basophils Absolute 0.0  0.0 - 0.1 (K/uL)   COMPREHENSIVE METABOLIC PANEL     Status: Abnormal   Collection Time   01/07/12  2:35 PM      Component Value Range Comment   Sodium 137  135 - 145 (mEq/L)    Potassium 3.4 (*) 3.5 - 5.1 (mEq/L)    Chloride 99  96 - 112 (mEq/L)    CO2 20  19 - 32 (mEq/L)    Glucose, Bld 191 (*) 70 - 99 (mg/dL)    BUN 20  6 - 23 (mg/dL)    Creatinine, Ser 0.86  0.50 - 1.10 (mg/dL)    Calcium 57.8  8.4 - 10.5 (mg/dL)    Total Protein 7.3  6.0 - 8.3 (g/dL)    Albumin 4.1  3.5 - 5.2 (g/dL)    AST 30  0 - 37 (U/L)    ALT 27  0 - 35 (U/L)    Alkaline Phosphatase 88  39 - 117 (U/L)    Total Bilirubin 0.6  0.3 - 1.2 (mg/dL)    GFR calc non Af Amer 86 (*) >90 (mL/min)    GFR calc Af Amer >90  >90 (mL/min)   LIPASE, BLOOD     Status: Normal   Collection Time   01/07/12  2:35 PM      Component Value Range Comment   Lipase 23  11 - 59 (U/L)   URINALYSIS, ROUTINE W REFLEX MICROSCOPIC     Status: Abnormal   Collection Time   01/07/12  3:38 PM      Component Value Range Comment   Color, Urine YELLOW  YELLOW     APPearance CLOUDY (*) CLEAR  Specific Gravity, Urine 1.025  1.005 - 1.030     pH 6.0  5.0 - 8.0     Glucose, UA NEGATIVE  NEGATIVE (mg/dL)    Hgb urine dipstick NEGATIVE  NEGATIVE     Bilirubin Urine NEGATIVE  NEGATIVE     Ketones, ur 40 (*) NEGATIVE (mg/dL)    Protein, ur NEGATIVE  NEGATIVE (mg/dL)    Urobilinogen, UA 0.2  0.0 - 1.0 (mg/dL)    Nitrite NEGATIVE   NEGATIVE     Leukocytes, UA NEGATIVE  NEGATIVE  MICROSCOPIC NOT DONE ON URINES WITH NEGATIVE PROTEIN, BLOOD, LEUKOCYTES, NITRITE, OR GLUCOSE <1000 mg/dL.   Ct Abdomen Pelvis W Contrast  01/07/2012  *RADIOLOGY REPORT*  Clinical Data: Abdominal pain  CT ABDOMEN AND PELVIS WITH CONTRAST  Technique:  Multidetector CT imaging of the abdomen and pelvis was performed following the standard protocol during bolus administration of intravenous contrast.  Contrast: OMNIPAQUE IOHEXOL 300 MG/ML IV SOLN  Comparison: 04/26/2005  Findings: Calcified atherosclerotic disease is noted involving the left anterior descending artery.  There is a small subpleural nodule in the left base which measures 4 mm, image #8.  Unchanged from previous exam. Likely benign.  Interval development of moderate abdominal ascites.  No focal liver abnormality.  Gallbladder appears normal.  The spleen is negative.  Right adrenal nodule measures 2 x 3.1 cm, image 20. Previously this measured 1.7 x 3.1 cm.  Normal appearance of both kidneys.  There is no focal kidney abnormality.  No upper abdominal adenopathy.  There is no pelvic or inguinal adenopathy.  The urinary bladder appears collapsed.  The stomach and proximal small bowel loops have a normal caliber. The mid small bowel loops are increased in caliber measuring up 2.7 cm.  The terminal ileum appears collapsed.  The transition point is within the central abdomen, image 18 of the coronal series.  There is no abnormal fluid collections identified within the abdomen or pelvis.  Postoperative change compatible with the lumbar fusion extends from L3-L5.  There are compression fractures involving the L2, L3, and L4 vertebra.  IMPRESSION:  1.  Examination is positive for small bowel obstruction with transition point in the central abdomen.  2.  Moderate abdominal ascites. 3.  Right adrenal gland adenoma as described previously.  Original Report Authenticated By: Rosealee Albee, M.D.    Review of  Systems  Constitutional: Positive for chills.  HENT: Negative.   Eyes: Negative.        Bilateral cataract extractions.  Respiratory: Negative.   Cardiovascular: Negative for chest pain, palpitations, orthopnea, claudication and leg swelling.  Gastrointestinal: Positive for nausea, vomiting, abdominal pain (Started at the rib cage and felt like she was blown up.) and blood in stool. Negative for diarrhea and constipation.  Genitourinary: Negative.   Musculoskeletal: Negative.   Skin: Negative.   Neurological: Negative.   Endo/Heme/Allergies: Negative.   Psychiatric/Behavioral: Negative.     Blood pressure 139/91, pulse 117, temperature 98.3 F (36.8 C), temperature source Oral, resp. rate 20, SpO2 95.00%. Physical Exam  Constitutional: She is oriented to person, place, and time. She appears well-developed and well-nourished.  HENT:  Head: Normocephalic and atraumatic.  Nose: Nose normal.  Eyes: Conjunctivae and EOM are normal. Pupils are equal, round, and reactive to light.  Neck: Normal range of motion. Neck supple. No JVD present. No thyromegaly present.  Cardiovascular: Normal rate, regular rhythm, normal heart sounds and intact distal pulses.  Exam reveals no friction rub.   No murmur  heard. Respiratory: Effort normal and breath sounds normal. No respiratory distress. She has no wheezes. She has no rales.  GI: Soft. Bowel sounds are normal. She exhibits no distension and no mass. There is no tenderness. There is no rebound and no guarding.  Musculoskeletal: Normal range of motion. She exhibits no edema.  Lymphadenopathy:    She has no cervical adenopathy.  Neurological: She is alert and oriented to person, place, and time. She has normal reflexes. No cranial nerve deficit.  Skin: Skin is warm and dry.  Psychiatric: She has a normal mood and affect. Her behavior is normal. Judgment and thought content normal.     Assessment/Plan 1. Small bowel obstruction 2. Right adrenal  adenoma/4 mm subpleural nodule on CT 3. Hypertension 4. Abnormal EKG/coronary calcification on CT 5. Chronic back pain/arthritis.  Plan: I will admit patient, asked medicine to evaluate, for her abnormal EKG. Plan bowel rest, IV hydration, and repeat films in the a.m. Further workup as needed. [facility] physician assistant for Dr. Mikey Bussing.  Laurie Galloway 01/07/2012, 6:16 PM

## 2012-01-07 NOTE — ED Notes (Signed)
Family states that Dr. Elroy Channel did not want to have the NG placed at this time.  He would like to have her try to pass on her own.

## 2012-01-07 NOTE — H&P (Signed)
I have personally interviewed and examined this patient. I agree with the assessment and care plan as outlined by Mr. Marlyne Beards.  This pleasant woman presents with a 12 hour history of abdominal pain and vomiting. She had a normal bowel movement this morning but nothing since. Her pain is much better now. The only prior operation is an open appendectomy many years ago. She has had a vaginal hysterectomy. She has never had a bowel obstruction before  On exam she is alert and pleasant but elderly. She is  a little bit tachycardic but otherwise stable. Abdomen is soft, not really tender, not obviously distended. I do not feel any hernias of the abdominal wall or inguinal or femoral area. There is well-healed scar in the right lower quadrant.  CT scan is reviewed and suggested adhesive partial small bowel obstruction. There is no free fluid or signs of ischemia or tumor or adenopathy.  She will be admitted to the hospital. Medicine consult is requested.   I think we can initially treat her with bowel rest and hold all pumping the NG tube. . Now. Hopefully this will resolve. She is aware that this may require surgical intervention if the obstruction progresses. She is comfortable with our plan.   Angelia Mould. Derrell Lolling, M.D., [facility] General and Minimally invasive Surgery Breast and Colorectal Surgery Office:   [phone] Pager:   [phone]

## 2012-01-07 NOTE — ED Notes (Signed)
Pt reports onset of generalized abdominal pain after breakfast this am. Reports nausea/vomiting/chills. Pt reports normal BM this am.

## 2012-01-07 NOTE — Consult Note (Signed)
PCP:   No primary provider on file.   Chief Complaint:  Abdominal pain nausea and vomiting.   HPI: Patient is a 78 year old white female with past medical history significant for hypertension she's had back surgery, hysterectomy and appendectomy. Patient stated that she woke up this morning and had breakfast as usual then she went to the bathroom had a bowel movement her stomach felt uneasy so she went again had another bowel movement after that she started having some nausea vomiting and abdominal pain. She took 2 Tums and then a Gas-X without any improvement in her symptoms. She complained of dry heaving and having chills but no chest pain no shortness of breath. She then came into the emergency room secondary to these symptoms. In the emergency room she had a CT of the abdomen and pelvis done that showed small bowel obstruction. General surgery is admitting and we are consulted for medical management.   in the emergency room patient was noted to have bleeding from the rectum when she tried to have a bowel movement in the commode.  Her heart rate went into the 180's.  Review of Systems:  Negative otherwise stated in history of present illness.  Past Medical History: Past Medical History  Diagnosis Date  . Back problem   . Hypertension   . PONV (postoperative nausea and vomiting)    Past Surgical History  Procedure Date  . Back surgery   . Appendectomy   . Abdominal hysterectomy   . Tonsillectomy     age 80    Medications: Prior to Admission medications   Medication Sig Start Date End Date Taking? Authorizing Provider  beta carotene w/minerals (OCUVITE) tablet Take 1 tablet by mouth daily.   Yes Historical Provider, MD  calcium-vitamin D (OSCAL WITH D) 500-200 MG-UNIT per tablet Take 1 tablet by mouth daily.   Yes Historical Provider, MD  celecoxib (CELEBREX) 200 MG capsule Take 200 mg by mouth daily.   Yes Historical Provider, MD  gabapentin (NEURONTIN) 300 MG capsule Take 300 mg  by mouth 3 (three) times daily.   Yes Historical Provider, MD  losartan (COZAAR) 100 MG tablet Take 100 mg by mouth daily.   Yes Historical Provider, MD  omega-3 acid ethyl esters (LOVAZA) 1 G capsule Take 1 g by mouth daily.   Yes Historical Provider, MD  Polyethyl Glycol-Propyl Glycol (SYSTANE) 0.4-0.3 % SOLN Place 1 drop into both eyes daily.   Yes Historical Provider, MD  traMADol (ULTRAM) 50 MG tablet Take 50 mg by mouth every 6 (six) hours as needed. TAKE 1-2 TABLETS EVERY 6 HOURS AS NEEDED   Yes Historical Provider, MD  verapamil (VERELAN PM) 360 MG 24 hr capsule Take 360 mg by mouth at bedtime.   Yes Historical Provider, MD    Allergies:   Allergies  Allergen Reactions  . Sulfa Antibiotics     Social History:  Reports that she has never smoked. She has never used smokeless tobacco. She reports that she does not drink alcohol or use illicit drugs.  she is widowed.     Family History: Both parents are deceased. Mother died at 26 years old from old age. Father died at 5 from a massive heart attack.   Physical Exam: Filed Vitals:   01/07/12 1415 01/07/12 1540 01/07/12 1702 01/07/12 1826  BP: 190/98 171/77 139/91 115/62  Pulse: 112 114 117 82  Temp: 97.8 F (36.6 C)  98.3 F (36.8 C)   TempSrc: Oral  Oral   Resp:  20  20  SpO2: 98% 99% 95% 100%   General: Patient appears younger than her stated age. HEENT: Head normocephalic atraumatic pupils reactive to light. Cardiovascular: Irregular, no murmurs. Lungs: Clear to auscultations bilaterally. Abdomen: Soft mildly tender in the epigastric area, normal bowel sounds. Extremities: Varicose veins in the right lower extremity. No edema. Neuro: Nonfocal     Labs on Admission:   [facility] 01/07/12 1847 01/07/12 1435  NA -- 137  K -- 3.4*  CL -- 99  CO2 -- 20  GLUCOSE -- 191*  BUN -- 20  CREATININE -- 0.57  CALCIUM -- 10.3  MG 1.7 --  PHOS -- --    Basename 01/07/12 1435  AST 30  ALT 27  ALKPHOS 88  BILITOT  0.6  PROT 7.3  ALBUMIN 4.1    Basename 01/07/12 1435  LIPASE 23  AMYLASE --    Basename 01/07/12 1435  WBC 9.7  NEUTROABS 8.5*  HGB 13.4  HCT 40.1  MCV 85.9  PLT 302   No results found for this basename: CKTOTAL:3,CKMB:3,CKMBINDEX:3,TROPONINI:3 in the last 72 hours No results found for this basename: TSH,T4TOTAL,FREET3,T3FREE,THYROIDAB in the last 72 hours No results found for this basename: VITAMINB12:2,FOLATE:2,FERRITIN:2,TIBC:2,IRON:2,RETICCTPCT:2 in the last 72 hours  Radiological Exams on Admission: Ct Abdomen Pelvis W Contrast  01/07/2012  *RADIOLOGY REPORT*  Clinical Data: Abdominal pain  CT ABDOMEN AND PELVIS WITH CONTRAST  Technique:  Multidetector CT imaging of the abdomen and pelvis was performed following the standard protocol during bolus administration of intravenous contrast.  Contrast: OMNIPAQUE IOHEXOL 300 MG/ML IV SOLN  Comparison: 04/26/2005  Findings: Calcified atherosclerotic disease is noted involving the left anterior descending artery.  There is a small subpleural nodule in the left base which measures 4 mm, image #8.  Unchanged from previous exam. Likely benign.  Interval development of moderate abdominal ascites.  No focal liver abnormality.  Gallbladder appears normal.  The spleen is negative.  Right adrenal nodule measures 2 x 3.1 cm, image 20. Previously this measured 1.7 x 3.1 cm.  Normal appearance of both kidneys.  There is no focal kidney abnormality.  No upper abdominal adenopathy.  There is no pelvic or inguinal adenopathy.  The urinary bladder appears collapsed.  The stomach and proximal small bowel loops have a normal caliber. The mid small bowel loops are increased in caliber measuring up 2.7 cm.  The terminal ileum appears collapsed.  The transition point is within the central abdomen, image 18 of the coronal series.  There is no abnormal fluid collections identified within the abdomen or pelvis.  Postoperative change compatible with the lumbar  fusion extends from L3-L5.  There are compression fractures involving the L2, L3, and L4 vertebra.  IMPRESSION:  1.  Examination is positive for small bowel obstruction with transition point in the central abdomen.  2.  Moderate abdominal ascites. 3.  Right adrenal gland adenoma as described previously.  Original Report Authenticated By: Rosealee Albee, M.D.    Assessment/Plan Small bowel obstruction Per general surgery. Patient is presently bowel rest. NG tube as needed per surgery recommendation.  Hypertension Patient has a history of hypertension. Blood pressure is presently stable. She normally takes verapamil at home which she has not taken secondary to the nausea vomiting. Will resume her verapamil. If she is not taking by mouth was then will do Lopressor when necessary. I suspect that her heart rate is elevated secondary to pain in addition to being off her calcium channel blocker.  Tachycardia/irregular rhythm/PACs  Patient with tachycardia and what looks like Premature atrial contractions.   She has no chest pain. No acute ST segment elevation or depression on EKG. Will get 2-D echo and monitor for now.  Will resume her calcium channel blocker. Bed rest for now.  GI bleed Patient in the ED was noted to be having GI bleeding. Will check coag panel, repeat stat H&H, transfer to ICU, type and screen and hold blood.  Patient has epigastric tenderness so she could have upper GI bleed, BUN is normal  lower GI bleed also possible. Discussed with GI Dr. Juanda Chance who will see patient in the morning unless she becomes unstable tonight and need immediate scope. She also recommends possibly placing NG tube to see if patient has rapid upper GI bleed.  Will defer to surgery.  Chronic back pain Discontinue Celebrex.  Hypokalemia Replete potassium IV. Magnesium level is within normal limits.  DO NOT RESUSCITATE Discussed CODE STATUS with patient. She does not want to be shocked  or intubated. Talked  with her about discussing this with her family. She states that they have discussed this in the past and that is her decision. Patient will be seen by [facility] Dr. Gwenlyn Perking   Time spent on this patient including examination and decision-making process:60 minutes.  Carollee Massed [phone] 01/07/2012, 8:50 PM

## 2012-01-07 NOTE — Progress Notes (Addendum)
Patient got up to have a bowel movement. She her bowel movement had stool mixed with bright blood. Heart rate was 130 at the time but PPD remains stable. We gave her a fluid bolus and heart rate is down to about 105 now. She is no longer diaphoretic. She has mild abdominal pain but nothing severe.  On exam she is alert. Not diaphoretic. Abdomen is soft, perhaps slightly distended, perhaps slightly tender but with no guarding or rebound. Rectal exam reveals some solid stool and some fresh blood. There is no mass in the distal rectum by exam.  Assessment: Lower GI bleeding, superimposed on what appears to be a partial small bowel obstruction  Plan: Transfer ICU Type and crossmatch 4 units RBC Recheck CBC and coags If she continues to bleed or becomes unstable then I would do a tagged RBC scan to be followed by angiography Close monitoring for ongoing bleeding, SBO, mesenteric ischemia I have discussed her care with Dr. Odelia Gage, [facility], who will see the patient in the morning.   Angelia Mould. Derrell Lolling, M.D., [facility] General and Minimally invasive Surgery Breast and Colorectal Surgery Office:   [phone] Pager:   [phone]

## 2012-01-07 NOTE — ED Notes (Signed)
Pt past copious amount of bright red blood in stool just now. Dr. Earlene Plater and Dr. Derrell Lolling notified.

## 2012-01-07 NOTE — ED Notes (Signed)
Spoke with Dr Derrell Lolling on phone at approx 2100, verbal orders given and verified, also discussed with hospitalist,  Pt needs 2nd IV, she has began bleeding rectally, bright red blood,  She is pale and diaphoretic,  And having severe abdominal cramping,  Terri RN charge at bedside and several other RN's attending to pt,  Pt is on bedside cardiac monitor,  She continues to be tachycardic,  Pt also SOB,  Dr Dickie La wants NG tube placed,  Dr Derrell Lolling says not at this time

## 2012-01-07 NOTE — ED Provider Notes (Addendum)
History     CSN: [id number]  Arrival date & time 01/07/12  1408   First MD Initiated Contact with Patient 01/07/12 1418      Chief Complaint  Patient presents with  . Abdominal Pain  . Nausea  . Emesis    (Consider location/radiation/quality/duration/timing/severity/associated sxs/prior treatment) HPI Comments: Started this morning with pains across upper abd that have become severe.  Patient is a 78 y.o. female presenting with abdominal pain and vomiting. The history is provided by the patient.  Abdominal Pain The primary symptoms of the illness include abdominal pain and vomiting. The primary symptoms of the illness do not include fever or dysuria. The onset of the illness was gradual.  The patient has not had a change in bowel habit. Additional symptoms associated with the illness include chills.  Emesis  Associated symptoms include abdominal pain and chills. Pertinent negatives include no fever.    Past Medical History  Diagnosis Date  . Back problem   . Hypertension     Past Surgical History  Procedure Date  . Back surgery   . Appendectomy   . Abdominal hysterectomy     No family history on file.  History  Substance Use Topics  . Smoking status: Never Smoker   . Smokeless tobacco: Not on file  . Alcohol Use: No    OB History    Grav Para Term Preterm Abortions TAB SAB Ect Mult Living                  Review of Systems  Constitutional: Positive for chills. Negative for fever.  Gastrointestinal: Positive for vomiting and abdominal pain.  Genitourinary: Negative for dysuria.  All other systems reviewed and are negative.    Allergies  Sulfa antibiotics  Home Medications  No current outpatient prescriptions on file.  BP 190/98  Pulse 112  Temp(Src) 97.8 F (36.6 C) (Oral)  SpO2 98%  Physical Exam  Nursing note and vitals reviewed. Constitutional: She is oriented to person, place, and time. She appears well-developed and well-nourished.    Pale, very uncomfortable.  HENT:  Head: Normocephalic and atraumatic.  Neck: Normal range of motion. Neck supple.  Cardiovascular: Normal rate and regular rhythm.   No murmur heard. Pulmonary/Chest: Breath sounds normal. She is in respiratory distress.  Abdominal: Soft. She exhibits no distension. There is tenderness.       ttp in the epigastrium.  Musculoskeletal: Normal range of motion.  Neurological: She is alert and oriented to person, place, and time.  Skin: Skin is warm. She is diaphoretic. There is pallor.    ED Course  Procedures (including critical care time)   Labs Reviewed  CBC  DIFFERENTIAL  COMPREHENSIVE METABOLIC PANEL  LIPASE, BLOOD  URINALYSIS, ROUTINE W REFLEX MICROSCOPIC   No results found.   No diagnosis found.   Date: 01/07/2012  Rate: 109  Rhythm: sinus tachycardia  QRS Axis: normal  Intervals: normal  ST/T Wave abnormalities: normal  Conduction Disutrbances:none  Narrative Interpretation:   Old EKG Reviewed: unchanged    MDM  The patient presented with significant epigastric pain, nausea.  Her abdomen was quite tender in the epigastric region and she was somewhat diaphoretic.  As she would not tolerate po contrast, I obtained a CT of the abdomen and pelvis with IV but no oral contrast.  This shows what appears to be a small bowel obstruction along with moderate ascites.  Will consult internal medicine for admission.  I spoke with Dr. Earlene Plater who wants  we to consult general surgery first.  I spoke with Dr. Corliss Skains who will see the patient in the ED and decide whether she will be admitted to surgery or medicine.        Geoffery Lyons, MD 01/07/12 1637  Geoffery Lyons, MD 01/07/12 [phone]

## 2012-01-07 NOTE — ED Provider Notes (Signed)
Pt being admitted by [facility], they had requested med consult - discussed w [facility]/Dr Olena Leatherwood - they will consult on pts med issues.  Suzi Roots, MD 01/07/12 [phone]

## 2012-01-07 NOTE — ED Notes (Signed)
Call Lou at [phone] , report attempt x one

## 2012-01-08 ENCOUNTER — Other Ambulatory Visit: Payer: Self-pay

## 2012-01-08 ENCOUNTER — Encounter (HOSPITAL_COMMUNITY): Payer: Self-pay | Admitting: Anesthesiology

## 2012-01-08 ENCOUNTER — Inpatient Hospital Stay (HOSPITAL_COMMUNITY): Payer: Medicare Other

## 2012-01-08 ENCOUNTER — Inpatient Hospital Stay (HOSPITAL_COMMUNITY): Payer: Medicare Other | Admitting: Anesthesiology

## 2012-01-08 ENCOUNTER — Encounter (HOSPITAL_COMMUNITY): Admission: EM | Disposition: A | Discharge status: Home Health | Payer: Self-pay | Source: Home / Self Care

## 2012-01-08 ENCOUNTER — Other Ambulatory Visit (INDEPENDENT_AMBULATORY_CARE_PROVIDER_SITE_OTHER): Payer: Self-pay | Admitting: Surgery

## 2012-01-08 DIAGNOSIS — K659 Peritonitis, unspecified: Secondary | ICD-10-CM

## 2012-01-08 DIAGNOSIS — K55059 Acute (reversible) ischemia of intestine, part and extent unspecified: Secondary | ICD-10-CM

## 2012-01-08 DIAGNOSIS — F05 Delirium due to known physiological condition: Secondary | ICD-10-CM

## 2012-01-08 DIAGNOSIS — R000 Tachycardia, unspecified: Secondary | ICD-10-CM

## 2012-01-08 HISTORY — PX: LAPAROTOMY: SHX154

## 2012-01-08 HISTORY — PX: BOWEL RESECTION: SHX1257

## 2012-01-08 LAB — DIFFERENTIAL
Basophils Absolute: 0 10*3/uL (ref 0.0–0.1)
Basophils Relative: 0 % (ref 0–1)
Eosinophils Absolute: 0 10*3/uL (ref 0.0–0.7)
Eosinophils Relative: 0 % (ref 0–5)
Lymphocytes Relative: 8 % — ABNORMAL LOW (ref 12–46)
Lymphs Abs: 2.2 10*3/uL (ref 0.7–4.0)
Monocytes Absolute: 2.1 10*3/uL — ABNORMAL HIGH (ref 0.1–1.0)
Monocytes Relative: 7 % (ref 3–12)
Neutro Abs: 25.4 10*3/uL — ABNORMAL HIGH (ref 1.7–7.7)
Neutrophils Relative %: 85 % — ABNORMAL HIGH (ref 43–77)

## 2012-01-08 LAB — COMPREHENSIVE METABOLIC PANEL
ALT: 17 U/L (ref 0–35)
ALT: 13 U/L (ref 0–35)
AST: 22 U/L (ref 0–37)
AST: 21 U/L (ref 0–37)
Albumin: 2.7 g/dL — ABNORMAL LOW (ref 3.5–5.2)
Albumin: 1.8 g/dL — ABNORMAL LOW (ref 3.5–5.2)
Alkaline Phosphatase: 61 U/L (ref 39–117)
Alkaline Phosphatase: 38 U/L — ABNORMAL LOW (ref 39–117)
BUN: 33 mg/dL — ABNORMAL HIGH (ref 6–23)
BUN: 29 mg/dL — ABNORMAL HIGH (ref 6–23)
CO2: 19 mEq/L (ref 19–32)
CO2: 22 mEq/L (ref 19–32)
Calcium: 8.4 mg/dL (ref 8.4–10.5)
Calcium: 7.3 mg/dL — ABNORMAL LOW (ref 8.4–10.5)
Chloride: 116 mEq/L — ABNORMAL HIGH (ref 96–112)
Chloride: 119 mEq/L — ABNORMAL HIGH (ref 96–112)
Creatinine, Ser: 0.75 mg/dL (ref 0.50–1.10)
Creatinine, Ser: 0.68 mg/dL (ref 0.50–1.10)
GFR calc Af Amer: >90 mL/min (ref >90)
GFR calc Af Amer: >90 mL/min (ref >90)
GFR calc non Af Amer: 78 mL/min — ABNORMAL LOW (ref >90)
GFR calc non Af Amer: 81 mL/min — ABNORMAL LOW (ref >90)
Glucose, Bld: 160 mg/dL — ABNORMAL HIGH (ref 70–99)
Glucose, Bld: 137 mg/dL — ABNORMAL HIGH (ref 70–99)
Potassium: 4.6 mEq/L (ref 3.5–5.1)
Potassium: 4.2 mEq/L (ref 3.5–5.1)
Sodium: 143 mEq/L (ref 135–145)
Sodium: 145 mEq/L (ref 135–145)
Total Bilirubin: 0.5 mg/dL (ref 0.3–1.2)
Total Bilirubin: 0.3 mg/dL (ref 0.3–1.2)
Total Protein: 5.3 g/dL — ABNORMAL LOW (ref 6.0–8.3)
Total Protein: 3.6 g/dL — ABNORMAL LOW (ref 6.0–8.3)

## 2012-01-08 LAB — CBC
HCT: 32.9 % — ABNORMAL LOW (ref 36.0–46.0)
HCT: 44.1 % (ref 36.0–46.0)
HCT: 47.5 % — ABNORMAL HIGH (ref 36.0–46.0)
HCT: 47.8 % — ABNORMAL HIGH (ref 36.0–46.0)
Hemoglobin: 10.9 g/dL — ABNORMAL LOW (ref 12.0–15.0)
Hemoglobin: 14.8 g/dL (ref 12.0–15.0)
Hemoglobin: 16.4 g/dL — ABNORMAL HIGH (ref 12.0–15.0)
Hemoglobin: 16.4 g/dL — ABNORMAL HIGH (ref 12.0–15.0)
MCH: 29.3 pg (ref 26.0–34.0)
MCH: 29.5 pg (ref 26.0–34.0)
MCH: 29.9 pg (ref 26.0–34.0)
MCH: 30.3 pg (ref 26.0–34.0)
MCHC: 33.1 g/dL (ref 30.0–36.0)
MCHC: 33.6 g/dL (ref 30.0–36.0)
MCHC: 34.3 g/dL (ref 30.0–36.0)
MCHC: 34.5 g/dL (ref 30.0–36.0)
MCV: 87.2 fL (ref 78.0–100.0)
MCV: 87.6 fL (ref 78.0–100.0)
MCV: 87.8 fL (ref 78.0–100.0)
MCV: 88.4 fL (ref 78.0–100.0)
Platelets: 219 10*3/uL (ref 150–400)
Platelets: 314 10*3/uL (ref 150–400)
Platelets: 322 10*3/uL (ref 150–400)
Platelets: 338 10*3/uL (ref 150–400)
RBC: 3.72 MIL/uL — ABNORMAL LOW (ref 3.87–5.11)
RBC: 5.02 MIL/uL (ref 3.87–5.11)
RBC: 5.42 MIL/uL — ABNORMAL HIGH (ref 3.87–5.11)
RBC: 5.48 MIL/uL — ABNORMAL HIGH (ref 3.87–5.11)
RDW: 13.4 % (ref 11.5–15.5)
RDW: 13.6 % (ref 11.5–15.5)
RDW: 13.8 % (ref 11.5–15.5)
RDW: 13.9 % (ref 11.5–15.5)
WBC: 12.5 10*3/uL — ABNORMAL HIGH (ref 4.0–10.5)
WBC: 16.3 10*3/uL — ABNORMAL HIGH (ref 4.0–10.5)
WBC: 18.7 10*3/uL — ABNORMAL HIGH (ref 4.0–10.5)
WBC: 29.7 10*3/uL — ABNORMAL HIGH (ref 4.0–10.5)

## 2012-01-08 LAB — BASIC METABOLIC PANEL
BUN: 28 mg/dL — ABNORMAL HIGH (ref 6–23)
BUN: 31 mg/dL — ABNORMAL HIGH (ref 6–23)
CO2: 18 mEq/L — ABNORMAL LOW (ref 19–32)
CO2: 19 mEq/L (ref 19–32)
Calcium: 8.3 mg/dL — ABNORMAL LOW (ref 8.4–10.5)
Calcium: 8.8 mg/dL (ref 8.4–10.5)
Chloride: 111 mEq/L (ref 96–112)
Chloride: 113 mEq/L — ABNORMAL HIGH (ref 96–112)
Creatinine, Ser: 0.68 mg/dL (ref 0.50–1.10)
Creatinine, Ser: 0.76 mg/dL (ref 0.50–1.10)
GFR calc Af Amer: >90 mL/min (ref >90)
GFR calc Af Amer: >90 mL/min (ref >90)
GFR calc non Af Amer: 81 mL/min — ABNORMAL LOW (ref >90)
GFR calc non Af Amer: 78 mL/min — ABNORMAL LOW (ref >90)
Glucose, Bld: 213 mg/dL — ABNORMAL HIGH (ref 70–99)
Glucose, Bld: 194 mg/dL — ABNORMAL HIGH (ref 70–99)
Potassium: 5.7 mEq/L — ABNORMAL HIGH (ref 3.5–5.1)
Potassium: 5 mEq/L (ref 3.5–5.1)
Sodium: 140 mEq/L (ref 135–145)
Sodium: 142 mEq/L (ref 135–145)

## 2012-01-08 LAB — HEMOGLOBIN AND HEMATOCRIT, BLOOD
HCT: 46.1 % — ABNORMAL HIGH (ref 36.0–46.0)
HCT: 45 % (ref 36.0–46.0)
Hemoglobin: 16 g/dL — ABNORMAL HIGH (ref 12.0–15.0)
Hemoglobin: 15.4 g/dL — ABNORMAL HIGH (ref 12.0–15.0)

## 2012-01-08 LAB — MAGNESIUM: Magnesium: 1.6 mg/dL (ref 1.5–2.5)

## 2012-01-08 LAB — ABO/RH: ABO/RH(D): O POS

## 2012-01-08 LAB — MRSA PCR SCREENING: MRSA by PCR: NEGATIVE

## 2012-01-08 LAB — LACTIC ACID, PLASMA
Lactic Acid, Venous: 2.3 mmol/L — ABNORMAL HIGH (ref 0.5–2.2)
Lactic Acid, Venous: 1.6 mmol/L (ref 0.5–2.2)
Lactic Acid, Venous: 1.3 mmol/L (ref 0.5–2.2)

## 2012-01-08 LAB — PHOSPHORUS: Phosphorus: 3.2 mg/dL (ref 2.3–4.6)

## 2012-01-08 SURGERY — EGD (ESOPHAGOGASTRODUODENOSCOPY)
Anesthesia: Moderate Sedation

## 2012-01-08 SURGERY — LAPAROTOMY, EXPLORATORY
Anesthesia: General | Site: Abdomen

## 2012-01-08 MED ORDER — SODIUM CHLORIDE 0.9 % IV SOLN
INTRAVENOUS | Status: DC
  Administered 2012-01-08 (×2): via INTRAVENOUS

## 2012-01-08 MED ORDER — SODIUM CHLORIDE 0.9 % IV SOLN
1.0000 g | INTRAVENOUS | Status: DC
  Administered 2012-01-08 – 2012-01-13 (×6): 1 g via INTRAVENOUS
  Filled 2012-01-08 (×7): qty 1

## 2012-01-08 MED ORDER — FENTANYL CITRATE 0.05 MG/ML IJ SOLN
INTRAMUSCULAR | Status: DC | PRN
  Administered 2012-01-08: 50 ug via INTRAVENOUS
  Administered 2012-01-08: 100 ug via INTRAVENOUS
  Administered 2012-01-08 (×2): 50 ug via INTRAVENOUS

## 2012-01-08 MED ORDER — METOPROLOL TARTRATE 1 MG/ML IV SOLN
INTRAVENOUS | Status: AC
  Administered 2012-01-08: 5 mg via INTRAVENOUS
  Filled 2012-01-08: qty 5

## 2012-01-08 MED ORDER — METOPROLOL TARTRATE 1 MG/ML IV SOLN
5.0000 mg | INTRAVENOUS | Status: DC
  Administered 2012-01-08 – 2012-01-10 (×11): 5 mg via INTRAVENOUS
  Filled 2012-01-08 (×16): qty 5

## 2012-01-08 MED ORDER — SODIUM CHLORIDE 0.9 % IV SOLN
INTRAVENOUS | Status: DC
  Administered 2012-01-09 – 2012-01-11 (×5): via INTRAVENOUS

## 2012-01-08 MED ORDER — SODIUM CHLORIDE 0.9 % IV SOLN: 1.0000 g | INTRAVENOUS | Status: DC

## 2012-01-08 MED ORDER — ONDANSETRON HCL 4 MG/2ML IJ SOLN
INTRAMUSCULAR | Status: DC | PRN
  Administered 2012-01-08 (×2): 1 mg via INTRAVENOUS

## 2012-01-08 MED ORDER — LIDOCAINE HCL (CARDIAC) 20 MG/ML IV SOLN
INTRAVENOUS | Status: DC | PRN
  Administered 2012-01-08: 20 mg via INTRAVENOUS

## 2012-01-08 MED ORDER — MIDAZOLAM HCL 5 MG/5ML IJ SOLN
INTRAMUSCULAR | Status: DC | PRN
  Administered 2012-01-08: 0.5 mg via INTRAVENOUS

## 2012-01-08 MED ORDER — PROPOFOL 10 MG/ML IV EMUL
INTRAVENOUS | Status: DC | PRN
  Administered 2012-01-08: 120 mg via INTRAVENOUS

## 2012-01-08 MED ORDER — SUCCINYLCHOLINE CHLORIDE 20 MG/ML IJ SOLN
INTRAMUSCULAR | Status: DC | PRN
  Administered 2012-01-08: 100 mg via INTRAVENOUS

## 2012-01-08 MED ORDER — HYDROMORPHONE HCL PF 1 MG/ML IJ SOLN
INTRAMUSCULAR | Status: AC
  Filled 2012-01-08: qty 1

## 2012-01-08 MED ORDER — ENOXAPARIN SODIUM 40 MG/0.4ML ~~LOC~~ SOLN
40.0000 mg | SUBCUTANEOUS | Status: DC
  Administered 2012-01-09 – 2012-01-14 (×6): 40 mg via SUBCUTANEOUS
  Filled 2012-01-08 (×6): qty 0.4

## 2012-01-08 MED ORDER — SODIUM CHLORIDE 0.9 % IV BOLUS (SEPSIS)
1000.0000 mL | Freq: Once | INTRAVENOUS | Status: AC
  Administered 2012-01-08: 1000 mL via INTRAVENOUS

## 2012-01-08 MED ORDER — CISATRACURIUM BESYLATE 2 MG/ML IV SOLN
INTRAVENOUS | Status: DC | PRN
  Administered 2012-01-08: 3 mg via INTRAVENOUS
  Administered 2012-01-08: 5 mg via INTRAVENOUS

## 2012-01-08 MED ORDER — MORPHINE SULFATE 2 MG/ML IJ SOLN
2.0000 mg | INTRAMUSCULAR | Status: DC | PRN
  Administered 2012-01-08 – 2012-01-09 (×5): 2 mg via INTRAVENOUS
  Filled 2012-01-08 (×6): qty 1

## 2012-01-08 MED ORDER — DROPERIDOL 2.5 MG/ML IJ SOLN
INTRAMUSCULAR | Status: DC | PRN
  Administered 2012-01-08: .5 mg via INTRAVENOUS

## 2012-01-08 MED ORDER — SODIUM CHLORIDE 0.9 % IV SOLN
INTRAVENOUS | Status: DC | PRN
  Administered 2012-01-08: 18:00:00 via INTRAVENOUS

## 2012-01-08 MED ORDER — NEOSTIGMINE METHYLSULFATE 1 MG/ML IJ SOLN
INTRAMUSCULAR | Status: DC | PRN
  Administered 2012-01-08: 3 mg via INTRAVENOUS

## 2012-01-08 MED ORDER — 0.9 % SODIUM CHLORIDE (POUR BTL) OPTIME
TOPICAL | Status: DC | PRN
  Administered 2012-01-08: 6000 mL

## 2012-01-08 MED ORDER — PROMETHAZINE HCL 25 MG/ML IJ SOLN: 6.2500 mg | INTRAMUSCULAR | Status: DC | PRN

## 2012-01-08 MED ORDER — HYDROMORPHONE HCL PF 1 MG/ML IJ SOLN
INTRAMUSCULAR | Status: DC | PRN
  Administered 2012-01-08: 0.5 mg via INTRAVENOUS

## 2012-01-08 MED ORDER — LACTATED RINGERS IV SOLN
INTRAVENOUS | Status: DC | PRN
  Administered 2012-01-08: 19:00:00 via INTRAVENOUS

## 2012-01-08 MED ORDER — ONDANSETRON HCL 4 MG/2ML IJ SOLN: 4.0000 mg | INTRAMUSCULAR | Status: DC | PRN

## 2012-01-08 MED ORDER — HYDROMORPHONE HCL PF 1 MG/ML IJ SOLN
0.2500 mg | INTRAMUSCULAR | Status: DC | PRN
  Administered 2012-01-08 (×7): 0.25 mg via INTRAVENOUS

## 2012-01-08 MED ORDER — KETAMINE HCL 10 MG/ML IJ SOLN
INTRAMUSCULAR | Status: DC | PRN
  Administered 2012-01-08: 10 mg via INTRAVENOUS

## 2012-01-08 MED ORDER — LACTATED RINGERS IV SOLN: INTRAVENOUS | Status: DC

## 2012-01-08 MED ORDER — LACTATED RINGERS IV SOLN
INTRAVENOUS | Status: DC | PRN
  Administered 2012-01-08: 17:00:00 via INTRAVENOUS

## 2012-01-08 MED ORDER — METOPROLOL TARTRATE 1 MG/ML IV SOLN
INTRAVENOUS | Status: AC
  Filled 2012-01-08: qty 5

## 2012-01-08 MED ORDER — METOPROLOL TARTRATE 1 MG/ML IV SOLN
5.0000 mg | INTRAVENOUS | Status: DC | PRN
  Administered 2012-01-08: 5 mg via INTRAVENOUS

## 2012-01-08 MED ORDER — HETASTARCH-ELECTROLYTES 6 % IV SOLN
INTRAVENOUS | Status: DC | PRN
  Administered 2012-01-08: 18:00:00 via INTRAVENOUS

## 2012-01-08 MED ORDER — GLYCOPYRROLATE 0.2 MG/ML IJ SOLN
INTRAMUSCULAR | Status: DC | PRN
  Administered 2012-01-08: .4 mg via INTRAVENOUS

## 2012-01-08 SURGICAL SUPPLY — 49 items
APPLICATOR COTTON TIP 6IN STRL (MISCELLANEOUS) ×2 IMPLANT
BLADE EXTENDED COATED 6.5IN (ELECTRODE) ×1 IMPLANT
BLADE HEX COATED 2.75 (ELECTRODE) ×2 IMPLANT
CANISTER SUCTION 2500CC (MISCELLANEOUS) ×2 IMPLANT
CLOTH BEACON ORANGE TIMEOUT ST (SAFETY) ×2 IMPLANT
COVER MAYO STAND STRL (DRAPES) ×1 IMPLANT
COVER PROBE U/S 5X48 (MISCELLANEOUS) ×1 IMPLANT
COVER SURGICAL LIGHT HANDLE (MISCELLANEOUS) ×2 IMPLANT
DRAPE LAPAROSCOPIC ABDOMINAL (DRAPES) ×2 IMPLANT
DRAPE UTILITY XL STRL (DRAPES) ×2 IMPLANT
DRAPE WARM FLUID 44X44 (DRAPE) ×1 IMPLANT
DRESSING TELFA ISLAND 4X8 (GAUZE/BANDAGES/DRESSINGS) ×1 IMPLANT
ELECT REM PT RETURN 9FT ADLT (ELECTROSURGICAL) ×2
ELECTRODE REM PT RTRN 9FT ADLT (ELECTROSURGICAL) ×1 IMPLANT
GLOVE BIO SURGEON STRL SZ7 (GLOVE) ×3 IMPLANT
GLOVE BIOGEL PI IND STRL 7.0 (GLOVE) ×1 IMPLANT
GLOVE BIOGEL PI IND STRL 7.5 (GLOVE) ×1 IMPLANT
GLOVE BIOGEL PI IND STRL 8 (GLOVE) IMPLANT
GLOVE BIOGEL PI IND STRL 8.5 (GLOVE) IMPLANT
GLOVE BIOGEL PI INDICATOR 7.0 (GLOVE) ×1
GLOVE BIOGEL PI INDICATOR 7.5 (GLOVE) ×2
GLOVE BIOGEL PI INDICATOR 8 (GLOVE) ×1
GLOVE BIOGEL PI INDICATOR 8.5 (GLOVE) ×1
GLOVE ECLIPSE 8.0 STRL XLNG CF (GLOVE) ×1 IMPLANT
GLOVE SURG SS PI 8.0 STRL IVOR (GLOVE) ×1 IMPLANT
GOWN STRL NON-REIN LRG LVL3 (GOWN DISPOSABLE) ×4 IMPLANT
GOWN STRL REIN XL XLG (GOWN DISPOSABLE) ×2 IMPLANT
KIT BASIN OR (CUSTOM PROCEDURE TRAY) ×2 IMPLANT
LIGASURE IMPACT 36 18CM CVD LR (INSTRUMENTS) ×1 IMPLANT
NS IRRIG 1000ML POUR BTL (IV SOLUTION) ×6 IMPLANT
PACK GENERAL/GYN (CUSTOM PROCEDURE TRAY) ×2 IMPLANT
RELOAD PROXIMATE 75MM BLUE (ENDOMECHANICALS) ×8 IMPLANT
RELOAD STAPLE 75 3.8 BLU REG (ENDOMECHANICALS) IMPLANT
SPONGE GAUZE 4X4 12PLY (GAUZE/BANDAGES/DRESSINGS) ×2 IMPLANT
SPONGE LAP 18X18 X RAY DECT (DISPOSABLE) ×2 IMPLANT
STAPLER GUN LINEAR PROX 60 (STAPLE) ×1 IMPLANT
STAPLER PROXIMATE 75MM BLUE (STAPLE) ×1 IMPLANT
STAPLER VISISTAT 35W (STAPLE) ×2 IMPLANT
SUCTION POOLE TIP (SUCTIONS) ×1 IMPLANT
SUT PDS AB 1 TP1 96 (SUTURE) ×2 IMPLANT
SUT SILK 2 0 SH CR/8 (SUTURE) ×1 IMPLANT
SUT SILK 3 0 (SUTURE) ×2
SUT SILK 3 0 SH CR/8 (SUTURE) ×1 IMPLANT
SUT SILK 3-0 18XBRD TIE 12 (SUTURE) IMPLANT
TAPE HYPAFIX 4 X10 (GAUZE/BANDAGES/DRESSINGS) ×1 IMPLANT
TOWEL OR 17X26 10 PK STRL BLUE (TOWEL DISPOSABLE) ×4 IMPLANT
TRAY FOLEY CATH 14FRSI W/METER (CATHETERS) ×2 IMPLANT
WATER STERILE IRR 1500ML POUR (IV SOLUTION) IMPLANT
YANKAUER SUCT BULB TIP NO VENT (SUCTIONS) ×1 IMPLANT

## 2012-01-08 NOTE — Consult Note (Signed)
Referring Provider: Dr. Claud Kelp Primary Care Physician:  Dr. Lupita Raider Primary Gastroenterologist:  Dr. Reece Agar  Reason for Consultation:  Abdominal pain, hematochezia  HPI: Laurie Galloway is a 78 y.o. female with no significant past GI history, who developed severe diffuse, fairly steady but waxing and waning abdominal pain, like a severe gas pain, yesterday morning after breakfast. Due to the persistence of the symptoms, and the lack of relief with antacids, the patient presented to the emergency room yesterday where a CT scan raised the question of a small bowel obstruction, and she was admitted to the general surgery service.   During the time of pain, she had hot and cold spells with chills, but no fever was never detected. The pain radiated somewhat to her back, which felt like it had a constriction band around it. She has had some slight nausea and regurgitated her Pepsi that she tried drinking yesterday, but there's been no frank vomiting.   Yesterday evening, shortly after admission, she had an episode of hematochezia. Since then, she has had no further bowel movements. She did have one episode of rectal bleeding several weeks ago, which was self-limited and presumably related to hemorrhoids, a problem she has had in the remote past.   The patient did not otherwise have any prodromal GI symptoms prior to yesterday's event, specifically, no anorexia or significant weight loss (she does estimate 5-10 pound weight loss over the past month, but she attributes this to alteration of her diet since the holidays). No problem with chronic or recurrent abdominal pain. She does tend to run slightly constipated, but manages this by fiber in her diet.   Since admission, the patient has had persistent tachycardia, progressive rise in white count from 9.7-18,700, a rise in hemoglobin from 13.4 to 16, mild acidosis with a serum bicarbonate of 19, progressive mild azotemia despite IV fluids  with BUN going from 20 to 31 and creatinine from 0.6 to 0.8, and her lactate level obtained this morning was minimally elevated at 2.3, normal being up to 2.2  On the other hand, she indicates that her pain is considerably better than yesterday. Yesterday, she was having extreme "rolling" abdominal pain and could not get comfortable even with morphine, wears today, she just feels "sore."   Past Medical History  Diagnosis Date  . Back problem   . Hypertension   . PONV (postoperative nausea and vomiting)     Past Surgical History  Procedure Date  . Back surgery   . Appendectomy   . Abdominal hysterectomy   . Tonsillectomy     age 56    Prior to Admission medications   Medication Sig Start Date End Date Taking? Authorizing Provider  beta carotene w/minerals (OCUVITE) tablet Take 1 tablet by mouth daily.   Yes Historical Provider, MD  calcium-vitamin D (OSCAL WITH D) 500-200 MG-UNIT per tablet Take 1 tablet by mouth daily.   Yes Historical Provider, MD  celecoxib (CELEBREX) 200 MG capsule Take 200 mg by mouth daily.   Yes Historical Provider, MD  gabapentin (NEURONTIN) 300 MG capsule Take 300 mg by mouth 3 (three) times daily.   Yes Historical Provider, MD  losartan (COZAAR) 100 MG tablet Take 100 mg by mouth daily.   Yes Historical Provider, MD  omega-3 acid ethyl esters (LOVAZA) 1 G capsule Take 1 g by mouth daily.   Yes Historical Provider, MD  Polyethyl Glycol-Propyl Glycol (SYSTANE) 0.4-0.3 % SOLN Place 1 drop into both eyes daily.  Yes Historical Provider, MD  traMADol (ULTRAM) 50 MG tablet Take 50 mg by mouth every 6 (six) hours as needed. TAKE 1-2 TABLETS EVERY 6 HOURS AS NEEDED   Yes Historical Provider, MD  verapamil (VERELAN PM) 360 MG 24 hr capsule Take 360 mg by mouth at bedtime.   Yes Historical Provider, MD    Current Facility-Administered Medications  Medication Dose Route Frequency Provider Last Rate Last Dose  . 0.9 %  sodium chloride infusion   Intravenous Once  Geoffery Lyons, MD 1,000 mL/hr at 01/07/12 1434    . 0.9 %  sodium chloride infusion   Intravenous Continuous Carlota Raspberry, MD 150 mL/hr at 01/08/12 0515 150 mL/hr at 01/08/12 0515  . acetaminophen (TYLENOL) tablet 650 mg  650 mg Oral Q6H PRN Sherrie George, PA       Or  . acetaminophen (TYLENOL) suppository 650 mg  650 mg Rectal Q6H PRN Sherrie George, PA      . iohexol (OMNIPAQUE) 300 MG/ML solution 100 mL  100 mL Intravenous Once PRN Medication Radiologist, MD   100 mL at 01/07/12 1609  . metoprolol (LOPRESSOR) 1 MG/ML injection           . metoprolol (LOPRESSOR) 1 MG/ML injection        5 mg at 01/08/12 0825  . metoprolol (LOPRESSOR) injection 5 mg  5 mg Intravenous Q4H Pleas Koch, MD   5 mg at 01/08/12 0830  . morphine 2 MG/ML injection 1-3 mg  1-3 mg Intravenous Q1H PRN Sherrie George, PA   2 mg at 01/08/12 0258  . morphine 4 MG/ML injection 4 mg  4 mg Intravenous Once Geoffery Lyons, MD   4 mg at 01/07/12 1432  . morphine 4 MG/ML injection 4 mg  4 mg Intravenous Once Geoffery Lyons, MD   4 mg at 01/07/12 1449  . ondansetron (ZOFRAN) injection 4 mg  4 mg Intravenous Once Geoffery Lyons, MD   4 mg at 01/07/12 1432  . ondansetron (ZOFRAN) injection 4 mg  4 mg Intravenous Q6H PRN Sherrie George, PA   4 mg at 01/08/12 0517  . pantoprazole (PROTONIX) injection 40 mg  40 mg Intravenous Q12H [place] Wandra Mannan, MD   40 mg at 01/08/12 1100  . polyvinyl alcohol (LIQUIFILM TEARS) 1.4 % ophthalmic solution 1 drop  1 drop Both Eyes Daily Ernestene Mention, MD      . potassium chloride 10 mEq in 100 mL IVPB  10 mEq Intravenous Q1 Hr x 2 Nov[place] Wandra Mannan, MD      . verapamil (CALAN-SR) CR tablet 360 mg  360 mg Oral Daily Ernestene Mention, MD      . white petrolatum (VASELINE) gel           . DISCONTD: 0.9 % NaCl with KCl 40 mEq / L  infusion   Intravenous Continuous Ernestene Mention, MD 150 mL/hr at 01/08/12 0400    . DISCONTD: metoprolol (LOPRESSOR) injection 2.5 mg  2.5 mg Intravenous Q6H PRN  Novlet Wandra Mannan, MD   2.5 mg at 01/08/12 0054  . DISCONTD: metoprolol (LOPRESSOR) injection 5 mg  5 mg Intravenous Q4H PRN Shelba Flake, MD   5 mg at 01/08/12 0210  . DISCONTD: pantoprazole (PROTONIX) injection 40 mg  40 mg Intravenous QHS Sherrie George, [place]   40 mg at 01/07/12 2108  . DISCONTD: Polyethyl Glycol-Propyl Glycol 0.4-0.3 % SOLN 1 drop  1 drop Both Eyes Daily Novlet Wandra Mannan, MD      .  DISCONTD: verapamil (CALAN-SR) CR tablet 360 mg  360 mg Oral NOW Nov[place] Wandra Mannan, MD        Allergies as of 01/07/2012 - Review Complete 01/07/2012  Allergen Reaction Noted  . Sulfa antibiotics  01/07/2012    History reviewed. No pertinent family history.  History   Social History  . Marital Status: Widowed    Spouse Name: N/A    Number of Children: N/A  . Years of Education: N/A   Occupational History  . Not on file.   Social History Main Topics  . Smoking status: Never Smoker   . Smokeless tobacco: Never Used  . Alcohol Use: No  . Drug Use: No  . Sexually Active:    Other Topics Concern  . Not on file   Social History Narrative  . No narrative on file    Review of Systems: Positive for symptoms noted in history of present illness, including slight nausea, chills, and diffuse abdominal pain, with episode of hematochezia yesterday evening. Gen:  Denies any fever, rigors, anorexia, involuntary weight loss CV:  Denies chest pain, angina, palpitations, orthopnea, PND Resp: Denies dyspnea, cough GII: Denies dysphagia, odynophagia, vomiting, vomiting blood, severe constipation, diarrhea.    MS: Denies joint pain or swelling.   Derm: Denies rash, itching,  hives, unhealing ulcers.  Heme: Denies bruising, bleeding, and enlarged lymph nodes.  Physical Exam: Vital signs in last 24 hours: Temp:  [97.8 F (36.6 C)-98.8 F (37.1 C)] 98.3 F (36.8 C) (02/06 0800) Pulse Rate:  [82-132] 100  (02/06 0800) Resp:  [19-24] 21  (02/06 0800) BP: (115-190)/(62-98)  174/89 mmHg (02/06 0800) SpO2:  [95 %-100 %] 98 % (02/06 0800) Weight:  [56.7 kg (125 lb)] 56.7 kg (125 lb) (02/05 2338) Last BM Date: 01/07/12 General:   Alert,  Well-developed, well-nourished, pleasant and cooperative in NAD although she does appear somewhat ill and uncomfortable Head:  Normocephalic and atraumatic. Eyes:  Sclera clear, no icterus.   Conjunctiva pink. Mouth:   No ulcerations or lesions.  Oropharynx pink mucous membranes are dry Neck:  No masses or thyromegaly. Lungs:  Clear throughout to auscultation.   No wheezes, crackles, or rhonchi. No evident respiratory distress. Heart:  Tachycardia, with regular rate and rhythm; no murmurs, clicks, rubs,  or gallops. Abdomen:  Soft, mild to moderately subjectively tender, no guarding, no rebound or rigidity. No masses, hepatosplenomegaly or ventral hernias noted. Quiet or absent bowel sounds, without bruits. Overall, the abdominal exam is fairly benign. No obvious ascites Rectal:  At this time, my exam shows dark maroon pasty dry stool   Msk:   Symmetrical without gross deformities. Pulses: Radial pulse is full on right side Extremities:  Without clubbing, cyanosis, or edema. Neurologic:  Alert and coherent;  grossly normal neurologically. Skin:  Intact without significant lesions or rashes. Some perianal erythema present, without overt hemorrhoids Cervical Nodes:  No significant cervical adenopathy. Psych:   Alert and cooperative. Normal mood and affect.  Intake/Output from previous day: 02/05 0701 - 02/06 0700 In: 300 [I.V.:300] Out: 225 [Urine:225] Intake/Output this shift: Total I/O In: -  Out: 125 [Urine:125]  Lab Results:  Basename 01/08/12 0805 01/08/12 0301 01/08/12 0013 01/07/12 2113  WBC -- 18.7* 16.3* 14.3*  HGB 16.0* 16.4* 16.4* --  HCT 46.1* 47.5* 47.8* --  PLT -- 338 322 315   BMET  Basename 01/08/12 0915 01/08/12 0301 01/07/12 1435  NA 142 140 137  K 5.0 5.7* 3.4*  CL 113* 111 99  CO2  19 18* 20    GLUCOSE 194* 213* 191*  BUN 31* 28* 20  CREATININE 0.76 0.68 0.57  CALCIUM 8.8 8.3* 10.3   LFT  Basename 01/07/12 1435  PROT 7.3  ALBUMIN 4.1  AST 30  ALT 27  ALKPHOS 88  BILITOT 0.6  BILIDIR --  IBILI --   PT/INR  Basename 01/07/12 2113  LABPROT 15.2  INR 1.18    Studies/Results: Ct Abdomen Pelvis W Contrast  01/07/2012  *RADIOLOGY REPORT*  Clinical Data: Abdominal pain  CT ABDOMEN AND PELVIS WITH CONTRAST  Technique:  Multidetector CT imaging of the abdomen and pelvis was performed following the standard protocol during bolus administration of intravenous contrast.  Contrast: OMNIPAQUE IOHEXOL 300 MG/ML IV SOLN  Comparison: 04/26/2005  Findings: Calcified atherosclerotic disease is noted involving the left anterior descending artery.  There is a small subpleural nodule in the left base which measures 4 mm, image #8.  Unchanged from previous exam. Likely benign.  Interval development of moderate abdominal ascites.  No focal liver abnormality.  Gallbladder appears normal.  The spleen is negative.  Right adrenal nodule measures 2 x 3.1 cm, image 20. Previously this measured 1.7 x 3.1 cm.  Normal appearance of both kidneys.  There is no focal kidney abnormality.  No upper abdominal adenopathy.  There is no pelvic or inguinal adenopathy.  The urinary bladder appears collapsed.  The stomach and proximal small bowel loops have a normal caliber. The mid small bowel loops are increased in caliber measuring up 2.7 cm.  The terminal ileum appears collapsed.  The transition point is within the central abdomen, image 18 of the coronal series.  There is no abnormal fluid collections identified within the abdomen or pelvis.  Postoperative change compatible with the lumbar fusion extends from L3-L5.  There are compression fractures involving the L2, L3, and L4 vertebra.  IMPRESSION:  1.  Examination is positive for small bowel obstruction with transition point in the central abdomen.  2.  Moderate  abdominal ascites. 3.  Right adrenal gland adenoma as described previously.  Original Report Authenticated By: Rosealee Albee, M.D.   Dg Abd 2 Views  01/08/2012  *RADIOLOGY REPORT*  Clinical Data: Small bowel obstruction  ABDOMEN - 2 VIEW  Comparison: CT abdomen pelvis of 01/07/2012  Findings: Supine and erect views of the abdomen show only slightly prominent loops of small bowel.  No significant gaseous distention of small bowel is seen.  No colonic distention is noted.  Contrast is noted within the urinary bladder.  No free air is seen on the erect view.  Hardware for posterior fusion from L3-L5 is noted.  IMPRESSION: No definite bowel obstruction.  Only slight gaseous distention of a few loops of small bowel.  No free air.  Original Report Authenticated By: Juline Patch, M.D.   Impression: 1. Abrupt onset of severe diffuse abdominal pain, somewhat improved 2. Radiographic evidence of small bowel obstruction on yesterday's CT, without overt SBO on today's KUB 3. Evidence for her third spacing with tachycardia, rise in hemoglobin, rise in BUN and creatinine despite IV fluids currently at 150 mL per hour, 4. Ascites by CT scan, new finding 5. Hematochezia with bloody stool on rectal exam, without drop in hemoglobin 6. Mildly low bicarbonate level and minimally elevated lactate level, consistent with acidosis perhaps due to hypoperfusion  Plan: I am unclear as to what is going on with this patient. I would wonder about an ischemic episode involving the small bowel, but  there was no evidence of that radiographically on yesterday's CT scan. If this were ischemic colitis, I would expect more diarrhea and probably some colonic thickening on CT. Clinically, the patient seems to be improving, although the trend of her labs is somewhat worrisome.  I will plan to discuss the case with the attending surgeon. As I see it, our options are continued observation with lab monitoring, repeating her CT scan to look  for evolution, or surgical exploration.   LOS: 1 day   Jyair Kiraly V  01/08/2012, 11:22 AM

## 2012-01-08 NOTE — Progress Notes (Signed)
[facility] CONSULT NOTE  Recommendations Principal Problem:  *Small bowel obstruction-abdominal x-ray from this morning 01/08/12 shows no small bowel obstruction. Abdomen pain seems less and is 6 on 10 persist count on admission of the patient is getting pain medications. Some concern for possible mesenteric ischemia. Her white count is climbing and she's had one time bleed. She also remains tachycardic with no good explanation other than pain and lack of her calcium channel blocker.  As such I will get a lactate and if this is elevated she may need to proceed to dedicated CT angiogram to determine if this is the case.  She does have risk of SBO secondary to a hysterectomy about 20 years ago and an appendectomy as a child. Further management to be delineated by general surgery-she is to be kept n.p.o. until seen by attending for further decisions and management Active Problems:  HTN (hypertension)-not well controlled at present time. Likely secondary to pain.  We'll schedule metoprolol IV every 4 hourly at 5 mg and keep the every 6 hour as needed dosage.  Tachycardia-acting multifactorial related to rebound hypertension and lack of calcium channel blocker. See above discussion.  Gastrointestinal bleeding-to be scoped by Dr. Dulce Sellar at 1:30.  Chronic back pain-Celebrex discontinued.  Epigastric pain-secondary likely to small bowel obstruction pain. Her pain is actually now more in the central abdomen around the umbilicus. Hyperkalemia-received 2 doses of IV potassium. We'll discontinue same and repeat labs at around 3 PM. If persistently elevated would add calcium gluconate and other temporizing measures. EKG done this morning does not show any teeth peak T waves or any other findings and I suspect that this will turn Leukocytosis-elevated white count could be secondary to multiple causes however she has no current fever at this time and her hemoglobin is also paradoxically risen. We will hold  antibiotics at this time and reassess. See above discussion regarding small bowel obstruction.     Marta Bouie,JAI 01/08/2012, 7:46 AM   Medications: Scheduled Meds:   . sodium chloride   Intravenous Once  . metoprolol      .  morphine injection  4 mg Intravenous Once  .  morphine injection  4 mg Intravenous Once  . ondansetron  4 mg Intravenous Once  . pantoprazole (PROTONIX) IV  40 mg Intravenous Q12H  . polyvinyl alcohol  1 drop Both Eyes Daily  . potassium chloride  10 mEq Intravenous Q1 Hr x 2  . verapamil  360 mg Oral NOW  . verapamil  360 mg Oral Daily  . white petrolatum      . DISCONTD: pantoprazole (PROTONIX) IV  40 mg Intravenous QHS  . DISCONTD: Polyethyl Glycol-Propyl Glycol  1 drop Both Eyes Daily   Continuous Infusions:   . sodium chloride 150 mL/hr (01/08/12 0515)  . DISCONTD: 0.9 % NaCl with KCl 40 mEq / L 150 mL/hr at 01/08/12 0400   PRN Meds:.acetaminophen, acetaminophen, iohexol, metoprolol, morphine, ondansetron, DISCONTD: metoprolol  Objective: Weight change:   Intake/Output Summary (Last 24 hours) at 01/08/12 0746 Last data filed at 01/08/12 0600  Gross per 24 hour  Intake    300 ml  Output    225 ml  Net     75 ml     HEENT Alert, orineted CF.  Some painful abd distress. CHEST cta b, no added sound CARDS s1 s2 no irreg beats, tachycardic to the 130's on tele ABD Soft, but slightl painful in epigastrium.  Bowel sounds significantly decreased.   NEURO alert oriented  moving all 4 limbs equally SKIN no lower extremity edema or other findings  Lab Results: CBC   Component Value Date/Time  WBC 18.7* 01/08/2012 0301  RBC 5.42* 01/08/2012 0301  HGB 16.4* 01/08/2012 0301  HCT 47.5* 01/08/2012 0301  PLT 338 01/08/2012 0301  MCV 87.6 01/08/2012 0301  MCH 30.3 01/08/2012 0301  MCHC 34.5 01/08/2012 0301  RDW 13.6 01/08/2012 0301  LYMPHSABS 1.0 01/07/2012 1435  MONOABS 0.3 01/07/2012 1435  EOSABS 0.0 01/07/2012 1435  BASOSABS 0.0 01/07/2012 1435   BMET    Component Value Date/Time  NA 140 01/08/2012 0301  K 5.7* 01/08/2012 0301  CL 111 01/08/2012 0301  CO2 18* 01/08/2012 0301  GLUCOSE 213* 01/08/2012 0301  BUN 28* 01/08/2012 0301  CREATININE 0.68 01/08/2012 0301  CALCIUM 8.3* 01/08/2012 0301  GFRNONAA 81* 01/08/2012 0301  GFRAA >90 01/08/2012 0301       Studies/Results: Ct Abdomen Pelvis W Contrast  01/07/2012  *RADIOLOGY REPORT*  Clinical Data: Abdominal pain  CT ABDOMEN AND PELVIS WITH CONTRAST  Technique:  Multidetector CT imaging of the abdomen and pelvis was performed following the standard protocol during bolus administration of intravenous contrast.  Contrast: OMNIPAQUE IOHEXOL 300 MG/ML IV SOLN  Comparison: 04/26/2005  Findings: Calcified atherosclerotic disease is noted involving the left anterior descending artery.  There is a small subpleural nodule in the left base which measures 4 mm, image #8.  Unchanged from previous exam. Likely benign.  Interval development of moderate abdominal ascites.  No focal liver abnormality.  Gallbladder appears normal.  The spleen is negative.  Right adrenal nodule measures 2 x 3.1 cm, image 20. Previously this measured 1.7 x 3.1 cm.  Normal appearance of both kidneys.  There is no focal kidney abnormality.  No upper abdominal adenopathy.  There is no pelvic or inguinal adenopathy.  The urinary bladder appears collapsed.  The stomach and proximal small bowel loops have a normal caliber. The mid small bowel loops are increased in caliber measuring up 2.7 cm.  The terminal ileum appears collapsed.  The transition point is within the central abdomen, image 18 of the coronal series.  There is no abnormal fluid collections identified within the abdomen or pelvis.  Postoperative change compatible with the lumbar fusion extends from L3-L5.  There are compression fractures involving the L2, L3, and L4 vertebra.  IMPRESSION:  1.  Examination is positive for small bowel obstruction with transition point in the central abdomen.   2.  Moderate abdominal ascites. 3.  Right adrenal gland adenoma as described previously.  Original Report Authenticated By: Rosealee Albee, M.D.   Dg Abd 2 Views  01/08/2012  *RADIOLOGY REPORT*  Clinical Data: Small bowel obstruction  ABDOMEN - 2 VIEW  Comparison: CT abdomen pelvis of 01/07/2012  Findings: Supine and erect views of the abdomen show only slightly prominent loops of small bowel.  No significant gaseous distention of small bowel is seen.  No colonic distention is noted.  Contrast is noted within the urinary bladder.  No free air is seen on the erect view.  Hardware for posterior fusion from L3-L5 is noted.  IMPRESSION: No definite bowel obstruction.  Only slight gaseous distention of a few loops of small bowel.  No free air.  Original Report Authenticated By: Juline Patch, M.D.

## 2012-01-08 NOTE — Progress Notes (Signed)
Subjective: Still tachycardic, WBC is up, H/H stable, K+5.7, she had 2 doses of KCl last PM. C/o being sore like she was cut.  Some ongoing nausea, no vomiting.  BM last PM bloody after I saw her in ER.  Objective: Vital signs in last 24 hours: Temp:  [97.8 F (36.6 C)-98.8 F (37.1 C)] 98.1 F (36.7 C) (02/06 0400) Pulse Rate:  [82-132] 123  (02/06 0600) Resp:  [19-24] 24  (02/06 0600) BP: (115-190)/(62-98) 159/84 mmHg (02/06 0600) SpO2:  [95 %-100 %] 99 % (02/06 0600) Weight:  [56.7 kg (125 lb)] 56.7 kg (125 lb) (02/05 2338) Last BM Date: 01/07/12  Intake/Output from previous day: 02/05 0701 - 02/06 0700 In: 300 [I.V.:300] Out: 225 [Urine:225] Intake/Output this shift:    PE:  Alert, NAD, still tachycardic, Chest clear, no chest pain.  Abd: c/o being sore, soft, sl. Distended, +BS, a small amount of Flatus. No further BM  1st time she has had blood in stool, she's aware of.  Lab Results:   Basename 01/08/12 0301 01/08/12 0013  WBC 18.7* 16.3*  HGB 16.4* 16.4*  HCT 47.5* 47.8*  PLT 338 322    Lab 01/07/12 1435  AST 30  ALT 27  ALKPHOS 88  BILITOT 0.6  PROT 7.3  ALBUMIN 4.1    BMET  Basename 01/08/12 0301 01/07/12 1435  NA 140 137  K 5.7* 3.4*  CL 111 99  CO2 18* 20  GLUCOSE 213* 191*  BUN 28* 20  CREATININE 0.68 0.57  CALCIUM 8.3* 10.3   PT/INR  Basename 01/07/12 2113  LABPROT 15.2  INR 1.18     Studies/Results: Ct Abdomen Pelvis W Contrast  01/07/2012  *RADIOLOGY REPORT*  Clinical Data: Abdominal pain  CT ABDOMEN AND PELVIS WITH CONTRAST  Technique:  Multidetector CT imaging of the abdomen and pelvis was performed following the standard protocol during bolus administration of intravenous contrast.  Contrast: OMNIPAQUE IOHEXOL 300 MG/ML IV SOLN  Comparison: 04/26/2005  Findings: Calcified atherosclerotic disease is noted involving the left anterior descending artery.  There is a small subpleural nodule in the left base which measures 4 mm,  image #8.  Unchanged from previous exam. Likely benign.  Interval development of moderate abdominal ascites.  No focal liver abnormality.  Gallbladder appears normal.  The spleen is negative.  Right adrenal nodule measures 2 x 3.1 cm, image 20. Previously this measured 1.7 x 3.1 cm.  Normal appearance of both kidneys.  There is no focal kidney abnormality.  No upper abdominal adenopathy.  There is no pelvic or inguinal adenopathy.  The urinary bladder appears collapsed.  The stomach and proximal small bowel loops have a normal caliber. The mid small bowel loops are increased in caliber measuring up 2.7 cm.  The terminal ileum appears collapsed.  The transition point is within the central abdomen, image 18 of the coronal series.  There is no abnormal fluid collections identified within the abdomen or pelvis.  Postoperative change compatible with the lumbar fusion extends from L3-L5.  There are compression fractures involving the L2, L3, and L4 vertebra.  IMPRESSION:  1.  Examination is positive for small bowel obstruction with transition point in the central abdomen.  2.  Moderate abdominal ascites. 3.  Right adrenal gland adenoma as described previously.  Original Report Authenticated By: Rosealee Albee, M.D.    Anti-infectives: Anti-infectives    None     Current Facility-Administered Medications  Medication Dose Route Frequency Provider Last Rate Last Dose  .  0.9 %  sodium chloride infusion   Intravenous Once Geoffery Lyons, MD 1,000 mL/hr at 01/07/12 1434    . 0.9 %  sodium chloride infusion   Intravenous Continuous Carlota Raspberry, MD 150 mL/hr at 01/08/12 0515 150 mL/hr at 01/08/12 0515  . acetaminophen (TYLENOL) tablet 650 mg  650 mg Oral Q6H PRN Sherrie George, PA       Or  . acetaminophen (TYLENOL) suppository 650 mg  650 mg Rectal Q6H PRN Sherrie George, PA      . iohexol (OMNIPAQUE) 300 MG/ML solution 100 mL  100 mL Intravenous Once PRN Medication Radiologist, MD   100 mL at 01/07/12 1609  .  metoprolol (LOPRESSOR) 1 MG/ML injection           . metoprolol (LOPRESSOR) injection 5 mg  5 mg Intravenous Q4H PRN Shelba Flake, MD   5 mg at 01/08/12 0210  . morphine 2 MG/ML injection 1-3 mg  1-3 mg Intravenous Q1H PRN Sherrie George, PA   2 mg at 01/08/12 0258  . morphine 4 MG/ML injection 4 mg  4 mg Intravenous Once Geoffery Lyons, MD   4 mg at 01/07/12 1432  . morphine 4 MG/ML injection 4 mg  4 mg Intravenous Once Geoffery Lyons, MD   4 mg at 01/07/12 1449  . ondansetron (ZOFRAN) injection 4 mg  4 mg Intravenous Once Geoffery Lyons, MD   4 mg at 01/07/12 1432  . ondansetron (ZOFRAN) injection 4 mg  4 mg Intravenous Q6H PRN Sherrie George, PA   4 mg at 01/08/12 0517  . pantoprazole (PROTONIX) injection 40 mg  40 mg Intravenous Q12H [place] Wandra Mannan, MD      . polyvinyl alcohol (LIQUIFILM TEARS) 1.4 % ophthalmic solution 1 drop  1 drop Both Eyes Daily Ernestene Mention, MD      . potassium chloride 10 mEq in 100 mL IVPB  10 mEq Intravenous Q1 Hr x 2 [place] Wandra Mannan, MD      . verapamil (CALAN-SR) CR tablet 360 mg  360 mg Oral NOW [place] Wandra Mannan, MD      . verapamil (CALAN-SR) CR tablet 360 mg  360 mg Oral Daily Ernestene Mention, MD      . white petrolatum (VASELINE) gel           . DISCONTD: 0.9 % NaCl with KCl 40 mEq / L  infusion   Intravenous Continuous Ernestene Mention, MD 150 mL/hr at 01/08/12 0400    . DISCONTD: metoprolol (LOPRESSOR) injection 2.5 mg  2.5 mg Intravenous Q6H PRN Nov[place] Wandra Mannan, MD   2.5 mg at 01/08/12 0054  . DISCONTD: pantoprazole (PROTONIX) injection 40 mg  40 mg Intravenous QHS Sherrie George, [place]   40 mg at 01/07/12 2108  . DISCONTD: Polyethyl Glycol-Propyl Glycol 0.4-0.3 % SOLN 1 drop  1 drop Both Eyes Daily [place] Wandra Mannan, MD        Assessment/Plan Lower GI bleeding, superimposed on what appears to be a partial small bowel obstruction [facility] contacted last PM for evaluation. (H/H stable) Arthritis/back pain on NSAID/pain  meds. Tachycardia Hypertension  Plan:  IV:NS@150ml /hr.  Protonix q12, calan, metoprolol q4 prn.  GI to see.  Keep NPO, hold DVT prophylaxis till bleeding issue resolved. Appreciate help from Medicine and GI.   LOS: 1 day    Vedh Ptacek 01/08/2012

## 2012-01-08 NOTE — Progress Notes (Signed)
*  PRELIMINARY RESULTS* Echocardiogram 2D Echocardiogram has been performed.  Clide Deutscher 01/08/2012, 2:11 PM

## 2012-01-08 NOTE — Consult Note (Signed)
Name: Laurie Galloway MRN: [id number] DOB: 04-20-1930    LOS: 1  PCCM CONSULTATION NOTE  History of Present Illness: 78 yo WF with no significant medical problems admitted on 2/6 with abdominal pain / distention x 1 day who undergone exploratory laparotomy and small bowel resection for bowel obstruction and ischemic small bowel.  Lines / Drains: 2/6  Foley 2/6  R IJ TLC 2/6  NGT  Cultures: None  Antibiotics: 2/6  Invanz  Tests / Events: 2/6  Exploratory laparotomy and bowel resection for bowel obstruction and ischemic bowel  The patient is confused and unable to provide history, which was obtained for available medical records.    Past Medical History  Diagnosis Date  . Back problem   . Hypertension   . PONV (postoperative nausea and vomiting)    Past Surgical History  Procedure Date  . Back surgery   . Appendectomy   . Abdominal hysterectomy   . Tonsillectomy     age 60   Prior to Admission medications   Medication Sig Start Date End Date Taking? Authorizing Provider  beta carotene w/minerals (OCUVITE) tablet Take 1 tablet by mouth daily.   Yes Historical Provider, MD  calcium-vitamin D (OSCAL WITH D) 500-200 MG-UNIT per tablet Take 1 tablet by mouth daily.   Yes Historical Provider, MD  celecoxib (CELEBREX) 200 MG capsule Take 200 mg by mouth daily.   Yes Historical Provider, MD  gabapentin (NEURONTIN) 300 MG capsule Take 300 mg by mouth 3 (three) times daily.   Yes Historical Provider, MD  losartan (COZAAR) 100 MG tablet Take 100 mg by mouth daily.   Yes Historical Provider, MD  omega-3 acid ethyl esters (LOVAZA) 1 G capsule Take 1 g by mouth daily.   Yes Historical Provider, MD  Polyethyl Glycol-Propyl Glycol (SYSTANE) 0.4-0.3 % SOLN Place 1 drop into both eyes daily.   Yes Historical Provider, MD  traMADol (ULTRAM) 50 MG tablet Take 50 mg by mouth every 6 (six) hours as needed. TAKE 1-2 TABLETS EVERY 6 HOURS AS NEEDED   Yes Historical Provider, MD  verapamil (VERELAN  PM) 360 MG 24 hr capsule Take 360 mg by mouth at bedtime.   Yes Historical Provider, MD   Allergies Allergies  Allergen Reactions  . Sulfa Antibiotics    Family History History reviewed. No pertinent family history.  Social History  reports that she has never smoked. She has never used smokeless tobacco. She reports that she does not drink alcohol or use illicit drugs.  Review Of Systems  Patient unable to provide  Vital Signs: Temp:  [97.2 F (36.2 C)-99.7 F (37.6 C)] 97.3 F (36.3 C) (02/06 2055) Pulse Rate:  [82-132] 131  (02/06 2055) Resp:  [19-27] 21  (02/06 2055) BP: (117-187)/(43-96) 172/43 mmHg (02/06 2055) SpO2:  [90 %-99 %] 94 % (02/06 2055) Weight:  [56.7 kg (125 lb)] 56.7 kg (125 lb) (02/05 2338) I/O last 3 completed shifts: In: 2150 [I.V.:1650; IV Piggyback:500] Out: 550 [Urine:550]  Physical Examination: General:  No acute distress, delirious Neuro:  Nonfocal HEENT:  PERRL, pink conjunctivae, dry membranes Neck:  Supple, no JVD   Cardiovascular:  RRR, tachycardia, no M/R/G Lungs:  Bilateral diminished air entry, no W/R/R Abdomen:  Soft, nontender, nondistended, bowel sounds absent, surgical dressing C/D/I Musculoskeletal:  Moves all extremities, no pedal edema Skin:  No rash  Ventilator settings:   Labs and Imaging:  Reviewed.  Please refer to the Assessment and Plan section for relevant results.  ASSESSMENT AND  PLAN  NEUROLOGIC A:  Encephalopathy / delirium, postop / anesthesia / opioids P: -->  Reorient -->  Morphine for pain control  PULMONARY No results found for this basename: PHART:5,PCO2:5,PCO2ART:5,PO2ART:5,HCO3:5,O2SAT:5 in the last 168 hours A:  No active issues P: -->  Supplemental O2 to keep SpO2>92%  CARDIOVASCULAR  Lab 01/08/12 1402 01/08/12 0915  TROPONINI -- --  LATICACIDVEN 1.6 2.3*  PROBNP -- --   A:  Sinus tachycardia, likely secondary to volume loss and pain.  Hemodynamically stable.  No signs of ischemia. P: -->   Restore intravascular volume -->  Pain control -->  Telemetry  RENAL  Lab 01/08/12 1409 01/08/12 0915 01/08/12 0301 01/07/12 1847 01/07/12 1435  NA 143 142 140 -- 137  K 4.6 5.0 -- -- --  CL 116* 113* 111 -- 99  CO2 19 19 18* -- 20  BUN 33* 31* 28* -- 20  CREATININE 0.75 0.76 0.68 -- 0.57  CALCIUM 8.4 8.8 8.3* -- 10.3  MG -- -- -- 1.7 --  PHOS -- -- -- -- --   A:  Normal renal function.  Intravascular volume depletion. P: -->  BMP, Mg, Phos in AM -->  IFV bolus f/b maintenance  GASTROINTESTINAL  Lab 01/08/12 1409 01/07/12 1435  AST 22 30  ALT 17 27  ALKPHOS 61 88  BILITOT 0.5 0.6  PROT 5.3* 7.3  ALBUMIN 2.7* 4.1   A:  Bowel obstruction, ischemic bowel s/p resection. P: -->  Per Surgery  HEMATOLOGIC  Lab 01/08/12 1409 01/08/12 1146 01/08/12 0805 01/08/12 0301 01/08/12 0013 01/07/12 2113 01/07/12 1435  HGB 14.8 15.4* 16.0* 16.4* 16.4* -- --  HCT 44.1 45.0 46.1* 47.5* 47.8* -- --  PLT 314 -- -- 338 322 315 302  INR -- -- -- -- -- 1.18 --  APTT -- -- -- -- -- 26 --   A:  No active issues. P: -->  CBC in AM  INFECTIOUS  Lab 01/08/12 1409 01/08/12 0301 01/08/12 0013 01/07/12 2113 01/07/12 1435  WBC 29.7* 18.7* 16.3* 14.3* 9.7  PROCALCITON -- -- -- -- --   A:  Peritonitis secondary to ischemic bowel. P: -->  Invanz -->  Follow fever curve / WBC  ENDOCRINE No results found for this basename: GLUCAP:5 in the last 168 hours A:  No active issues. P: -->  No interventions required  BEST PRACTICE / DISPOSITION -->  ICU status under [facility] -->  PCCM consulting -->  Full code (discussed with daughter (MPOA):  Apparently patient does not want to be kept alive by artificial means, but OK with aggressive treatment short term. -->  NPO -->  Enoxaparin  for DVT Px -->  Protonix IV for GI Px -->  Family updated at bedside  Orlean Bradford, M.D. Pulmonary and Critical Care Medicine [facility] Cell: [phone] Pager: [phone]  01/08/2012, 9:34  PM

## 2012-01-08 NOTE — Anesthesia Preprocedure Evaluation (Addendum)
Anesthesia Evaluation  Patient identified by MRN, date of birth, ID band Patient awake    Reviewed: Allergy & Precautions, H&P , NPO status , Patient's Chart, lab work & pertinent test results, reviewed documented beta blocker date and time   History of Anesthesia Complications (+) PONV  Airway Mallampati: II TM Distance: >3 FB Neck ROM: full    Dental No notable dental hx. (+) Dental Advisory Given and Teeth Intact   Pulmonary neg pulmonary ROS,  clear to auscultation  Pulmonary exam normal       Cardiovascular Exercise Tolerance: Good hypertension, On Home Beta Blockers neg cardio ROS regular Normal    Neuro/Psych Negative Neurological ROS  Negative Psych ROS   GI/Hepatic negative GI ROS, Neg liver ROS,   Endo/Other  Negative Endocrine ROS  Renal/GU negative Renal ROS  Genitourinary negative   Musculoskeletal   Abdominal   Peds  Hematology negative hematology ROS (+)   Anesthesia Other Findings   Reproductive/Obstetrics negative OB ROS                          Anesthesia Physical Anesthesia Plan  ASA: II and Emergent  Anesthesia Plan: General   Post-op Pain Management:    Induction: Intravenous, Cricoid pressure planned and Rapid sequence  Airway Management Planned: Oral ETT  Additional Equipment: CVP  Intra-op Plan:   Post-operative Plan: Extubation in OR  Informed Consent: I have reviewed the patients History and Physical, chart, labs and discussed the procedure including the risks, benefits and alternatives for the proposed anesthesia with the patient or authorized representative who has indicated his/her understanding and acceptance.   Dental Advisory Given  Plan Discussed with: CRNA and Surgeon  Anesthesia Plan Comments:        Anesthesia Quick Evaluation

## 2012-01-08 NOTE — Transfer of Care (Signed)
Immediate Anesthesia Transfer of Care Note  Patient: Laurie Galloway  Procedure(s) Performed:  EXPLORATORY LAPAROTOMY; SMALL BOWEL RESECTION  Patient Location: PACU  Anesthesia Type: General  Level of Consciousness: awake, alert  and oriented  Airway & Oxygen Therapy: Patient connected to face mask oxygen  Post-op Assessment: Report given to PACU RN and Post -op Vital signs reviewed and stable  Post vital signs: Reviewed and stable  Complications: No apparent anesthesia complications

## 2012-01-08 NOTE — Progress Notes (Signed)
eLink Physician-Brief Progress Note Patient Name: TRESSIA LABRUM DOB: 10-28-1930 MRN: [id number]  Date of Service  01/08/2012   HPI/Events of Note   Camera care - just returned from OR  Looks ok. HR tachycardic to 130. BP oka  eICU Interventions  Fluid bolus Check post op labs   Intervention Category Intermediate Interventions: Other:  Kenzley Ke 01/08/2012, 9:06 PM

## 2012-01-08 NOTE — Op Note (Signed)
Preop diagnosis: Peritonitis with probable ischemic small bowel Postop diagnosis: Ischemic small bowel secondary small bowel obstruction Procedure: Exploratory laparotomy and small bowel resection Laurie Galloway K. Assistant: Dr. Dwain Sarna  Indications: This is a 78 year old female who presents with a one-day history of worsening generalized abdominal pain abdominal distention and no bowel movements. She was diagnosed with a small bowel structure in and was admitted to the hospital. Her clinical status worsened throughout the day and she had a small bowel movement with some bloody discharge. She became more tachycardic and her white blood cell count increased to 30. On reexamination, she had obvious peritonitis. We made the decision to proceed directly to the operating room.  Description of procedure: The patient was brought to the operating room and placed in a supine position on the operating room table. After an adequate level of general anesthesia was obtained a Foley catheter was placed under sterile technique. The patient's abdomen was prepped with chlor prep and draped in sterile fashion. We made a midline incision and dissection was carried down to the fascia. The fascia was opened and we bluntly dissected into the peritoneal cavity. We encountered a large amount of old bloody-looking ascites. We suctioned this out. There was some obvious ischemic small bowel in the midabdomen towards the right side. We examined this area and lysed a single band of adhesions which was the cause of the bowel obstruction and strangulation. The remainder of the small bowel was exteriorized. There is a segment of obvious nonviable small bowel measuring about 2 feet long. The small bowel on either side of this segment appears viable. This seems to be in the proximal ileum. We resected the ischemic small bowel with 2 loads of the GIA-75 stapler. We created a side-to-side stapled anastomosis with a another firing of the  GIA-75 stapler. We closed the enterotomy with a TA 60 stapler. A reinforcing suture of 3-0 silk was placed at the crotch of the anastomosis. The mesenteric defect was closed with 2-0 silk sutures. We then palpated the anastomosis and it was widely patent. We irrigated the abdominal cavity with several liters of warm saline. No further bloody looking ascites was noted. The remainder the bowel appeared viable and healthy. The nasogastric tube was palpated within the stomach. We reapproximated the fascia with double-stranded #1 PDS suture. The subcutaneous tissues were irrigated and staples were used to close the skin. A dry dressing was applied. The patient was then extubated and brought to the recovery room in critical condition.  All sponge, initially, and needle counts are correct.  Wilmon Arms. Corliss Skains, MD, [facility] Surgery  01/08/2012 6:54 PM

## 2012-01-08 NOTE — Progress Notes (Signed)
Pt received from OR. NGT to lo intermittent suction, ports on CL capped and primary placed on pump.  XRAY aware of need for STAT  Xray.

## 2012-01-08 NOTE — Progress Notes (Signed)
Patient with worsening abdominal pain, elevated WBC, tachycardic.  Recommend immediate exploratory laparotomy with possible bowel resection.  The surgical procedure has been discussed with the patient.  Potential risks, benefits, alternative treatments, and expected outcomes have been explained.  All of the patient's questions at this time have been answered.  The likelihood of reaching the patient's treatment goal is good.  The patient understand the proposed surgical procedure and wishes to proceed.   Laurie Galloway. Corliss Skains, MD, [facility] Surgery  01/08/2012 4:20 PM

## 2012-01-08 NOTE — Progress Notes (Signed)
Inpatient Diabetes Program Recommendations  AACE/ADA: New Consensus Statement on Inpatient Glycemic Control (2009)  Target Ranges:  Prepandial:   less than 140 mg/dL      Peak postprandial:   less than 180 mg/dL (1-2 hours)      Critically ill patients:  140 - 180 mg/dL   Reason for Visit: Hyperglycemia Results for [place], PAONE (MRN [id number]) as of 01/08/2012 15:46  Ref. Range 01/07/2012 14:35 01/08/2012 03:01 01/08/2012 09:15 01/08/2012 14:09  Glucose Latest Range: 70-99 mg/dL 130 (H) 865 (H) 784 (H) 160 (H)    Inpatient Diabetes Program Recommendations Correction (SSI): Add Novolog sensitive Q4 HgbA1C: Check HgbA1C to assess glycemic control prior to hospitalization  Note: No hx of DM

## 2012-01-08 NOTE — Progress Notes (Signed)
Update on patient's condition: Repeat labs showed significant progression of white count elevation, now 29,700. She remains afebrile. Hemoglobin has finally started to come down with IV hydration and blood pressure is better. However, she is still tachycardic and is still in moderate abdominal pain, similar to this morning, not as bad as yesterday. Bowel sounds are still quiet. The abdomen is soft and without peritoneal findings, but rather diffusely moderately subjectively tender, without a lot of guarding. I have discussed the patient's case with Dr. Adriana Mccallum of surgery, who will be seeing the patient shortly. Of note, the patient is not currently on antibiotic therapy, presumably because she was thought to have an obstructive process on admission. However, given her case evolution and the significant recent bump in white count, I suspect that we will need to start antibiotics and I will discuss choice of agent with Dr. Harlon Flor.  Laurie Galloway, M.D. [phone]

## 2012-01-08 NOTE — Progress Notes (Signed)
eLink Physician-Brief Progress Note Patient Name: Laurie Galloway DOB: 08/11/1930 MRN: [id number]  Date of Service  01/08/2012   HPI/Events of Note  Ongoing tachycardia with HR in the 120s and BP of 138/68 in patient who is normally on calan for HR/BP control.  On prn lopressor 2.5 mg IV q6 hours prn HR greater than 120   eICU Interventions  Plan: Increase dose and frequency of lopressor to 5 mg IV q4 hours prn HR greater than 120.   Intervention Category Intermediate Interventions: Arrhythmia - evaluation and management  Jere Bostrom 01/08/2012, 2:02 AM

## 2012-01-08 NOTE — Progress Notes (Signed)
eLink Physician-Brief Progress Note Patient Name: Laurie Galloway DOB: [birth year] MRN: [id number]  Date of Service  01/08/2012   HPI/Events of Note  Called by CCS [name] for postop consult after plap SBO surgery.    eICU Interventions  Patient currently in OR holding area. I d/w anesthesia and requested CVL placement IJ or subclavian line   Intervention Category Intermediate Interventions: Communication with other healthcare providers and/or family  Aidynn Krenn 01/08/2012, 5:08 PM

## 2012-01-08 NOTE — Anesthesia Postprocedure Evaluation (Signed)
  Anesthesia Post-op Note  Patient: Laurie Galloway  Procedure(s) Performed:  EXPLORATORY LAPAROTOMY; SMALL BOWEL RESECTION  Patient Location: PACU  Anesthesia Type: General  Level of Consciousness: awake and alert   Airway and Oxygen Therapy: Patient Spontanous Breathing  Post-op Pain: mild  Post-op Assessment: Post-op Vital signs reviewed, Patient's Cardiovascular Status Stable, Respiratory Function Stable, Patent Airway and No signs of Nausea or vomiting  Post-op Vital Signs: stable  Complications: No apparent anesthesia complications

## 2012-01-08 NOTE — Anesthesia Procedure Notes (Signed)
Procedures Asked by critical care doc to place CVC under GA.  R/B discussed with patient pre op.  U/S guidance used.  Sterile procedure followed.  G/W through needle technique.  Triple lumen CVP placed to 20 cm, sutured, dressed sterily.  Will do CXR in PACU.  Time done from 1850 to 1905.

## 2012-01-08 NOTE — Progress Notes (Addendum)
Verapamil - held patient NPO and extremely nauseous.  Lopressor IV given for heart rate X 2 doses.  Ordered by Deterding.  Potassium - clarification 00:45 - Called Gwinda Passe, NP to clarify order for 2 runs of K ordered in ED but not given.  Patient currently on NS with 40 meq of K at 135ml/hr.  Runs to be held for BMET results at 3:00. 4:20 - Potassium level of 5.7 - order given to dc NS w/ 40 meq of K and hang NS at 116ml/hr  Blood Transfusion - clarification 01:28 Blood transfusions held at this time Hgb 16.4, Hct 47.8 per latest labs. Confirmed with Gwinda Passe, NP 3:00 blood work - shows Hgb of 16.4 and Hct of 47.5 will continue to hold transfusions at this time.  Confirmed with Gwinda Passe, NP  Stephanie Coup, RN

## 2012-01-09 DIAGNOSIS — K55059 Acute (reversible) ischemia of intestine, part and extent unspecified: Secondary | ICD-10-CM

## 2012-01-09 DIAGNOSIS — R000 Tachycardia, unspecified: Secondary | ICD-10-CM

## 2012-01-09 DIAGNOSIS — K659 Peritonitis, unspecified: Secondary | ICD-10-CM

## 2012-01-09 LAB — PROCALCITONIN: Procalcitonin: 0.27 ng/mL

## 2012-01-09 LAB — BASIC METABOLIC PANEL
BUN: 24 mg/dL — ABNORMAL HIGH (ref 6–23)
CO2: 23 mEq/L (ref 19–32)
Calcium: 7.4 mg/dL — ABNORMAL LOW (ref 8.4–10.5)
Chloride: 120 mEq/L — ABNORMAL HIGH (ref 96–112)
Creatinine, Ser: 0.63 mg/dL (ref 0.50–1.10)
GFR calc Af Amer: >90 mL/min (ref >90)
GFR calc non Af Amer: 83 mL/min — ABNORMAL LOW (ref >90)
Glucose, Bld: 130 mg/dL — ABNORMAL HIGH (ref 70–99)
Potassium: 4 mEq/L (ref 3.5–5.1)
Sodium: 146 mEq/L — ABNORMAL HIGH (ref 135–145)

## 2012-01-09 LAB — CBC
HCT: 31.5 % — ABNORMAL LOW (ref 36.0–46.0)
Hemoglobin: 10.4 g/dL — ABNORMAL LOW (ref 12.0–15.0)
MCH: 29.4 pg (ref 26.0–34.0)
MCHC: 33 g/dL (ref 30.0–36.0)
MCV: 89 fL (ref 78.0–100.0)
Platelets: 187 10*3/uL (ref 150–400)
RBC: 3.54 MIL/uL — ABNORMAL LOW (ref 3.87–5.11)
RDW: 14 % (ref 11.5–15.5)
WBC: 10.4 10*3/uL (ref 4.0–10.5)

## 2012-01-09 MED ORDER — CHLORHEXIDINE GLUCONATE 0.12 % MT SOLN
15.0000 mL | Freq: Two times a day (BID) | OROMUCOSAL | Status: DC
  Administered 2012-01-09 – 2012-01-14 (×12): 15 mL via OROMUCOSAL
  Filled 2012-01-09 (×14): qty 15

## 2012-01-09 MED ORDER — BIOTENE DRY MOUTH MT LIQD
15.0000 mL | Freq: Two times a day (BID) | OROMUCOSAL | Status: DC
  Administered 2012-01-09 – 2012-01-13 (×8): 15 mL via OROMUCOSAL

## 2012-01-09 NOTE — Progress Notes (Signed)
1 Day Post-Op  Subjective: Patient awake and alert; still with abdominal pain,as expected, but less severe than prior to surgery.   C/o being thirsty Appreciate CCM assistance  Objective: Vital signs in last 24 hours: Temp:  [97.2 F (36.2 C)-99.7 F (37.6 C)] 98.3 F (36.8 C) (02/07 0400) Pulse Rate:  [82-131] 117  (02/07 0700) Resp:  [19-27] 25  (02/07 0700) BP: (117-187)/(43-96) 165/72 mmHg (02/07 0700) SpO2:  [90 %-99 %] 96 % (02/07 0700) Weight:  [131 lb 9.8 oz (59.7 kg)] 131 lb 9.8 oz (59.7 kg) (02/07 0615) Last BM Date: 01/07/12  Intake/Output from previous day: 02/06 0701 - 02/07 0700 In: 4925 [I.V.:4425; IV Piggyback:500] Out: 1183 [Urine:1113; Blood:70] Intake/Output this shift:    General appearance: alert, cooperative and no distress Lungs - CTA B Abd - less distended; no bowel sounds Dressing - some spotting  Lab Results:   Basename 01/09/12 0455 01/08/12 2210  WBC 10.4 12.5*  HGB 10.4* 10.9*  HCT 31.5* 32.9*  PLT 187 219   BMET  Basename 01/09/12 0455 01/08/12 2210  NA 146* 145  K 4.0 4.2  CL 120* 119*  CO2 23 22  GLUCOSE 130* 137*  BUN 24* 29*  CREATININE 0.63 0.68  CALCIUM 7.4* 7.3*   PT/INR  Basename 01/07/12 2113  LABPROT 15.2  INR 1.18   ABG No results found for this basename: PHART:2,PCO2:2,PO2:2,HCO3:2 in the last 72 hours  Studies/Results: Ct Abdomen Pelvis W Contrast  01/07/2012  *RADIOLOGY REPORT*  Clinical Data: Abdominal pain  CT ABDOMEN AND PELVIS WITH CONTRAST  Technique:  Multidetector CT imaging of the abdomen and pelvis was performed following the standard protocol during bolus administration of intravenous contrast.  Contrast: OMNIPAQUE IOHEXOL 300 MG/ML IV SOLN  Comparison: 04/26/2005  Findings: Calcified atherosclerotic disease is noted involving the left anterior descending artery.  There is a small subpleural nodule in the left base which measures 4 mm, image #8.  Unchanged from previous exam. Likely benign.   Interval development of moderate abdominal ascites.  No focal liver abnormality.  Gallbladder appears normal.  The spleen is negative.  Right adrenal nodule measures 2 x 3.1 cm, image 20. Previously this measured 1.7 x 3.1 cm.  Normal appearance of both kidneys.  There is no focal kidney abnormality.  No upper abdominal adenopathy.  There is no pelvic or inguinal adenopathy.  The urinary bladder appears collapsed.  The stomach and proximal small bowel loops have a normal caliber. The mid small bowel loops are increased in caliber measuring up 2.7 cm.  The terminal ileum appears collapsed.  The transition point is within the central abdomen, image 18 of the coronal series.  There is no abnormal fluid collections identified within the abdomen or pelvis.  Postoperative change compatible with the lumbar fusion extends from L3-L5.  There are compression fractures involving the L2, L3, and L4 vertebra.  IMPRESSION:  1.  Examination is positive for small bowel obstruction with transition point in the central abdomen.  2.  Moderate abdominal ascites. 3.  Right adrenal gland adenoma as described previously.  Original Report Authenticated By: Rosealee Albee, M.D.   Dg Chest Port 1 View  01/08/2012  *RADIOLOGY REPORT*  Clinical Data: Central line placement.  Status post small bowel resection.  PORTABLE CHEST - 1 VIEW  Comparison: 03/12/2010  Findings: Right jugular central line in place with the tip in the SVC.  No pneumothorax.  Bibasilar atelectasis present.  Nasogastric tube extends into the stomach.  Free  air is seen around the liver likely related to the the laparotomy today.  IMPRESSION: Central line tip in SVC.  No pneumothorax after placement.  Free intraperitoneal air visualized which is likely secondary to laparotomy today.  Original Report Authenticated By: Reola Calkins, M.D.   Dg Abd 2 Views  01/08/2012  *RADIOLOGY REPORT*  Clinical Data: Small bowel obstruction  ABDOMEN - 2 VIEW  Comparison: CT abdomen  pelvis of 01/07/2012  Findings: Supine and erect views of the abdomen show only slightly prominent loops of small bowel.  No significant gaseous distention of small bowel is seen.  No colonic distention is noted.  Contrast is noted within the urinary bladder.  No free air is seen on the erect view.  Hardware for posterior fusion from L3-L5 is noted.  IMPRESSION: No definite bowel obstruction.  Only slight gaseous distention of a few loops of small bowel.  No free air.  Original Report Authenticated By: Juline Patch, M.D.    Anti-infectives: Anti-infectives     Start     Dose/Rate Route Frequency Ordered Stop   01/08/12 2130   ertapenem (INVANZ) 1 g in sodium chloride 0.9 % 50 mL IVPB  Status:  Discontinued        1 g 100 mL/hr over 30 Minutes Intravenous Every 24 hours 01/08/12 2124 01/08/12 2129   01/08/12 1700   ertapenem (INVANZ) 1 g in sodium chloride 0.9 % 50 mL IVPB        1 g 100 mL/hr over 30 Minutes Intravenous Every 24 hours 01/08/12 1615            Assessment/Plan: s/p  EXPLORATORY LAPAROTOMY; SMALL BOWEL RESECTION for ischemic bowel Continue foley due to urinary output monitoring Await bowel function - NGT and ice chips OOB to chair Tachycardia - ?still volume depleted; will await CCM input. If CCM feels comfortable with it, she can be changed to step-down status.   LOS: 2 days    Andrea Ferrer K. 01/09/2012

## 2012-01-09 NOTE — Consult Note (Signed)
Name: Laurie Galloway MRN: [id number] DOB: [birth year]    LOS: 2  PCCM CONSULTATION NOTE  History of Present Illness: 78 yo WF with no significant medical problems admitted on 2/6 with abdominal pain / distention x 1 day who undergone exploratory laparotomy and small bowel resection for bowel obstruction and ischemic small bowel.  Lines / Drains: 2/6  Foley>> 2/6  R IJ TLC>> 2/6  NGT>>  Cultures: None  Antibiotics: 2/6  Invanz>>  Tests / Events: 2/6  Exploratory laparotomy and bowel resection for bowel obstruction and ischemic bowel   Vital Signs: Temp:  [97.2 F (36.2 C)-99.7 F (37.6 C)] 98.2 F (36.8 C) (02/07 0800) Pulse Rate:  [82-131] 120  (02/07 0800) Resp:  [19-27] 26  (02/07 0800) BP: (117-187)/(43-96) 163/65 mmHg (02/07 0800) SpO2:  [90 %-99 %] 94 % (02/07 0800) Weight:  [131 lb 9.8 oz (59.7 kg)] 131 lb 9.8 oz (59.7 kg) (02/07 0615) I/O last 3 completed shifts: In: 5500 [I.V.:5000; IV Piggyback:500] Out: 1408 [Urine:1338; Blood:70]  Physical Examination: General:  No acute distress, alert Neuro:  intact HEENT:  PERRL, pink conjunctivae, dry membranes Neck:  Supple, no JVD   Cardiovascular:  RRR, tachycardia, no M/R/G Lungs:  Bilateral diminished air entry, no W/R/R Abdomen:  Soft, nontender, nondistended, bowel sounds absent, surgical dressing C/D/I Musculoskeletal:  Moves all extremities, no pedal edema Skin:  No rash    Labs and Imaging:  Lab 01/09/12 0455 01/08/12 2210 01/08/12 1409  NA 146* 145 143  K 4.0 4.2 4.6  CL 120* 119* 116*  CO2 23 22 19   BUN 24* 29* 33*  CREATININE 0.63 0.68 0.75  GLUCOSE 130* 137* 160*    Lab 01/09/12 0455 01/08/12 2210 01/08/12 1409  HGB 10.4* 10.9* 14.8  HCT 31.5* 32.9* 44.1  WBC 10.4 12.5* 29.7*  PLT 187 219 314   Ct Abdomen Pelvis W Contrast  01/07/2012  *RADIOLOGY REPORT*  Clinical Data: Abdominal pain  CT ABDOMEN AND PELVIS WITH CONTRAST  Technique:  Multidetector CT imaging of the abdomen and pelvis was  performed following the standard protocol during bolus administration of intravenous contrast.  Contrast: OMNIPAQUE IOHEXOL 300 MG/ML IV SOLN  Comparison: 04/26/2005  Findings: Calcified atherosclerotic disease is noted involving the left anterior descending artery.  There is a small subpleural nodule in the left base which measures 4 mm, image #8.  Unchanged from previous exam. Likely benign.  Interval development of moderate abdominal ascites.  No focal liver abnormality.  Gallbladder appears normal.  The spleen is negative.  Right adrenal nodule measures 2 x 3.1 cm, image 20. Previously this measured 1.7 x 3.1 cm.  Normal appearance of both kidneys.  There is no focal kidney abnormality.  No upper abdominal adenopathy.  There is no pelvic or inguinal adenopathy.  The urinary bladder appears collapsed.  The stomach and proximal small bowel loops have a normal caliber. The mid small bowel loops are increased in caliber measuring up 2.7 cm.  The terminal ileum appears collapsed.  The transition point is within the central abdomen, image 18 of the coronal series.  There is no abnormal fluid collections identified within the abdomen or pelvis.  Postoperative change compatible with the lumbar fusion extends from L3-L5.  There are compression fractures involving the L2, L3, and L4 vertebra.  IMPRESSION:  1.  Examination is positive for small bowel obstruction with transition point in the central abdomen.  2.  Moderate abdominal ascites. 3.  Right adrenal gland adenoma as described  previously.  Original Report Authenticated By: Rosealee Albee, M.D.   Dg Chest Port 1 View  01/08/2012  *RADIOLOGY REPORT*  Clinical Data: Central line placement.  Status post small bowel resection.  PORTABLE CHEST - 1 VIEW  Comparison: 03/12/2010  Findings: Right jugular central line in place with the tip in the SVC.  No pneumothorax.  Bibasilar atelectasis present.  Nasogastric tube extends into the stomach.  Free air is seen  around the liver likely related to the the laparotomy today.  IMPRESSION: Central line tip in SVC.  No pneumothorax after placement.  Free intraperitoneal air visualized which is likely secondary to laparotomy today.  Original Report Authenticated By: Reola Calkins, M.D.   Dg Abd 2 Views  01/08/2012  *RADIOLOGY REPORT*  Clinical Data: Small bowel obstruction  ABDOMEN - 2 VIEW  Comparison: CT abdomen pelvis of 01/07/2012  Findings: Supine and erect views of the abdomen show only slightly prominent loops of small bowel.  No significant gaseous distention of small bowel is seen.  No colonic distention is noted.  Contrast is noted within the urinary bladder.  No free air is seen on the erect view.  Hardware for posterior fusion from L3-L5 is noted.  IMPRESSION: No definite bowel obstruction.  Only slight gaseous distention of a few loops of small bowel.  No free air.  Original Report Authenticated By: Juline Patch, M.D.     ASSESSMENT AND PLAN  NEUROLOGIC A:  Encephalopathy / delirium, postop / anesthesia / opioids P: -->  Reorient -->  Morphine for pain control  PULMONARY No results found for this basename: PHART:5,PCO2:5,PCO2ART:5,PO2ART:5,HCO3:5,O2SAT:5 in the last 168 hours A:  No active issues P: -->  Supplemental O2 to keep SpO2>92%  CARDIOVASCULAR  Lab 01/08/12 2210 01/08/12 1402 01/08/12 0915  TROPONINI -- -- --  LATICACIDVEN 1.3 1.6 2.3*  PROBNP -- -- --   A:  Sinus tachycardia, likely secondary to volume loss and pain.  Hemodynamically stable.  No signs of ischemia. P: -->  Restore intravascular volume -->  Pain control -->  Telemetry  RENAL  Lab 01/09/12 0455 01/08/12 2210 01/08/12 1409 01/08/12 0915 01/08/12 0301 01/07/12 1847  NA 146* 145 143 142 140 --  K 4.0 4.2 -- -- -- --  CL 120* 119* 116* 113* 111 --  CO2 23 22 19 19  18* --  BUN 24* 29* 33* 31* 28* --  CREATININE 0.63 0.68 0.75 0.76 0.68 --  CALCIUM 7.4* 7.3* 8.4 8.8 8.3* --  MG -- 1.6 -- -- -- 1.7    PHOS -- 3.2 -- -- -- --   A:  Normal renal function.  Intravascular volume depletion. P: -->  BMP, Mg, Phos in AM -->  IFV bolus f/b maintenance  GASTROINTESTINAL  Lab 01/08/12 2210 01/08/12 1409 01/07/12 1435  AST 21 22 30   ALT 13 17 27   ALKPHOS 38* 61 88  BILITOT 0.3 0.5 0.6  PROT 3.6* 5.3* 7.3  ALBUMIN 1.8* 2.7* 4.1   A:  Bowel obstruction, ischemic bowel s/p resection. P: -->  Per Surgery  HEMATOLOGIC  Lab 01/09/12 0455 01/08/12 2210 01/08/12 1409 01/08/12 1146 01/08/12 0805 01/08/12 0301 01/08/12 0013 01/07/12 2113  HGB 10.4* 10.9* 14.8 15.4* 16.0* -- -- --  HCT 31.5* 32.9* 44.1 45.0 46.1* -- -- --  PLT 187 219 314 -- -- 338 322 --  INR -- -- -- -- -- -- -- 1.18  APTT -- -- -- -- -- -- -- 26   A:  No  active issues. P: -->  CBC in AM  INFECTIOUS  Lab 01/09/12 0455 01/08/12 2210 01/08/12 1409 01/08/12 0301 01/08/12 0013  WBC 10.4 12.5* 29.7* 18.7* 16.3*  PROCALCITON -- 0.27 -- -- --   A:  Peritonitis secondary to ischemic bowel. P: -->  Invanz -->  Follow fever curve / WBC  ENDOCRINE No results found for this basename: GLUCAP:5 in the last 168 hours A:  No active issues. P: -->  No interventions required  BEST PRACTICE / DISPOSITION -->  ICU status under CCS -->  PCCM consulting -->  Full code (discussed with daughter (MPOA):  Apparently patient does not want to be kept alive by artificial means, but OK with aggressive treatment short term. -->  NPO -->  Enoxaparin [place] for DVT Px -->  Protonix IV for GI Px   Steve Minor ACNP [facility] PCCM Pager [phone] till 3 pm If no answer page [phone] 01/09/2012, 10:41 AM Independently examined pt, evaluated data & formulated above care plan with NP PCCM to sign off, [facility] to resume care with [facility].  Azani Brogdon V.

## 2012-01-09 NOTE — Progress Notes (Signed)
Operating is reviewed. Patient sitting in a chair, appears comfortable. Less tachycardic, metabolically much improved with precipitous drop in white count, resolution of acidosis, resolution of hemoconcentration.  Impression: satisfactory progress status post resection of segment of compromised bowel due to adhesions  Plan: I will sign off at this time. Please let us know if we can be of further assistance in this patient's care  Florencia Reasons, M.D. [phone]

## 2012-01-09 NOTE — Progress Notes (Signed)
I have seen the patient and reviewed the labs, work-up and procedures thus far.  Patient currently under CCS service, with PCCM as consulting MD.  Will re-assume care in am if transferred to SDU status in am.  Pleas Koch, MD [facility] (P) [phone]

## 2012-01-10 ENCOUNTER — Inpatient Hospital Stay (HOSPITAL_COMMUNITY): Payer: Medicare Other

## 2012-01-10 LAB — CBC
HCT: 28 % — ABNORMAL LOW (ref 36.0–46.0)
Hemoglobin: 9.4 g/dL — ABNORMAL LOW (ref 12.0–15.0)
MCH: 29.8 pg (ref 26.0–34.0)
MCHC: 33.6 g/dL (ref 30.0–36.0)
MCV: 88.9 fL (ref 78.0–100.0)
Platelets: 152 10*3/uL (ref 150–400)
RBC: 3.15 MIL/uL — ABNORMAL LOW (ref 3.87–5.11)
RDW: 14 % (ref 11.5–15.5)
WBC: 10 10*3/uL (ref 4.0–10.5)

## 2012-01-10 LAB — BASIC METABOLIC PANEL
BUN: 16 mg/dL (ref 6–23)
CO2: 23 mEq/L (ref 19–32)
Calcium: 7.6 mg/dL — ABNORMAL LOW (ref 8.4–10.5)
Chloride: 115 mEq/L — ABNORMAL HIGH (ref 96–112)
Creatinine, Ser: 0.49 mg/dL — ABNORMAL LOW (ref 0.50–1.10)
GFR calc Af Amer: >90 mL/min (ref >90)
GFR calc non Af Amer: >90 mL/min (ref >90)
Glucose, Bld: 98 mg/dL (ref 70–99)
Potassium: 3 mEq/L — ABNORMAL LOW (ref 3.5–5.1)
Sodium: 145 mEq/L (ref 135–145)

## 2012-01-10 MED ORDER — POTASSIUM CHLORIDE 10 MEQ/100ML IV SOLN
10.0000 meq | INTRAVENOUS | Status: AC
  Administered 2012-01-10 (×5): 10 meq via INTRAVENOUS
  Filled 2012-01-10: qty 500

## 2012-01-10 MED ORDER — DILTIAZEM HCL 25 MG/5ML IV SOLN
60.0000 mg | INTRAVENOUS | Status: DC
  Administered 2012-01-10 – 2012-01-11 (×6): 60 mg via INTRAVENOUS
  Filled 2012-01-10 (×12): qty 15

## 2012-01-10 NOTE — Progress Notes (Signed)
[facility]ITALIST CONSULT NOTE  Recommendations Principal Problem:  *Small bowel obstruction-status post exploratory laparotomy and small bowel resection 01/08/12-day #2 postop. Pain management and decision to change to oral feeds per surgeon-NG output minimal.  Consider TPN if one or 2 more days without feeds. Albumin is 1.8. Invanz started status postop given necrotic bowel-surgeon to determine when to discontinue  Active Problems:  HTN (hypertension)-still suboptimally controlled. We'll schedule metoprolol IV every 4 hourly at 5 mg and keep the every 6 hour as needed dosage. Home medications include losartan 100 mg and verapamil 360 mg every 24 hours   Tachycardia-she does have some beats and heart rate goes from the low 100s to 120s.  This is sinus rhythm with some PACs and not atrial fibrillation  Will change metoprolol to diltiazem IV 60 O. times 4 hourly This likely is more so tachycardia secondary to rebound from being off her oxygen channel blocker.   Gastrointestinal bleeding-GI has signed off given the fact that she had small bowel obstruction   Chronic back pain-Celebrex discontinued.  Leukocytosis-resolved  Anemia-likely secondary to blood loss, and dilutional effect of fluids  AK I.-urine creatinine trending down from BUN 30-16 today. Would change fluids to D5 saline, given her n.p.o. Status (surgeon to make this decision)  Hypokalemia-replace  Hospitalist service will continue to follow for 24-48 hours-her medical issues seem to be stabilizing. Likely patient might require a skilled nursing facility.  Will ask PT/OT consult and have her mobilize. From my standpoint safe to transfer to telemetry bed  [facility] 01/10/2012, 7:56 AM   Medications: Scheduled Meds:    . antiseptic oral rinse  15 mL Mouth Rinse q12n4p  . chlorhexidine  15 mL Mouth Rinse BID  . enoxaparin  40 mg Subcutaneous Q24H  . ertapenem  1 g Intravenous Q24H  . HYDROmorphone      . metoprolol  5 mg  Intravenous Q4H  . pantoprazole (PROTONIX) IV  40 mg Intravenous Q12H  . polyvinyl alcohol  1 drop Both Eyes Daily  . potassium chloride  10 mEq Intravenous Q1 Hr x 5   Continuous Infusions:    . sodium chloride 125 mL/hr at 01/09/12 1900   PRN Meds:.morphine, ondansetron  Objective: Weight change: 3.3 kg (7 lb 4.4 oz)  Intake/Output Summary (Last 24 hours) at 01/10/12 0756 Last data filed at 01/10/12 0649  Gross per 24 hour  Intake 3087.09 ml  Output   1980 ml  Net 1107.09 ml     HEENT Alert, orineted CF.  less abdominal pain. CHEST cta b, no added sound CARDS s1 s2 no irreg beats, tachycardic to the 120's on tele. ABD Soft, but slightl painful in epigastrium.  Clean well-healed surgical scar in abdomen.  Bowel sounds significantly decreased.   NEURO alert oriented moving all 4 limbs equally SKIN no lower extremity edema or other findings  CBC    Component Value Date/Time   WBC 10.0 01/10/2012 0445   RBC 3.15* 01/10/2012 0445   HGB 9.4* 01/10/2012 0445   HCT 28.0* 01/10/2012 0445   PLT 152 01/10/2012 0445   MCV 88.9 01/10/2012 0445   MCH 29.8 01/10/2012 0445   MCHC 33.6 01/10/2012 0445   RDW 14.0 01/10/2012 0445   LYMPHSABS 2.2 01/08/2012 1409   MONOABS 2.1* 01/08/2012 1409   EOSABS 0.0 01/08/2012 1409   BASOSABS 0.0 01/08/2012 1409   BMET    Component Value Date/Time   NA 145 01/10/2012 0445   K 3.0* 01/10/2012 0445   CL 115*  01/10/2012 0445   CO2 23 01/10/2012 0445   GLUCOSE 98 01/10/2012 0445   BUN 16 01/10/2012 0445   CREATININE 0.49* 01/10/2012 0445   CALCIUM 7.6* 01/10/2012 0445   GFRNONAA >90 01/10/2012 0445   GFRAA >90 01/10/2012 0445    Studies/Results: Ct Abdomen Pelvis W Contrast  01/07/2012  *RADIOLOGY REPORT*  Clinical Data: Abdominal pain  CT ABDOMEN AND PELVIS WITH CONTRAST  Technique:  Multidetector CT imaging of the abdomen and pelvis was performed following the standard protocol during bolus administration of intravenous contrast.  Contrast: OMNIPAQUE IOHEXOL 300 MG/ML IV  SOLN  Comparison: 04/26/2005  Findings: Calcified atherosclerotic disease is noted involving the left anterior descending artery.  There is a small subpleural nodule in the left base which measures 4 mm, image #8.  Unchanged from previous exam. Likely benign.  Interval development of moderate abdominal ascites.  No focal liver abnormality.  Gallbladder appears normal.  The spleen is negative.  Right adrenal nodule measures 2 x 3.1 cm, image 20. Previously this measured 1.7 x 3.1 cm.  Normal appearance of both kidneys.  There is no focal kidney abnormality.  No upper abdominal adenopathy.  There is no pelvic or inguinal adenopathy.  The urinary bladder appears collapsed.  The stomach and proximal small bowel loops have a normal caliber. The mid small bowel loops are increased in caliber measuring up 2.7 cm.  The terminal ileum appears collapsed.  The transition point is within the central abdomen, image 18 of the coronal series.  There is no abnormal fluid collections identified within the abdomen or pelvis.  Postoperative change compatible with the lumbar fusion extends from L3-L5.  There are compression fractures involving the L2, L3, and L4 vertebra.  IMPRESSION:  1.  Examination is positive for small bowel obstruction with transition point in the central abdomen.  2.  Moderate abdominal ascites. 3.  Right adrenal gland adenoma as described previously.  Original Report Authenticated By: Rosealee Albee, M.D.   Dg Abd 2 Views  01/08/2012  *RADIOLOGY REPORT*  Clinical Data: Small bowel obstruction  ABDOMEN - 2 VIEW  Comparison: CT abdomen pelvis of 01/07/2012  Findings: Supine and erect views of the abdomen show only slightly prominent loops of small bowel.  No significant gaseous distention of small bowel is seen.  No colonic distention is noted.  Contrast is noted within the urinary bladder.  No free air is seen on the erect view.  Hardware for posterior fusion from L3-L5 is noted.  IMPRESSION: No definite  bowel obstruction.  Only slight gaseous distention of a few loops of small bowel.  No free air.  Original Report Authenticated By: Juline Patch, M.D.

## 2012-01-10 NOTE — Progress Notes (Signed)
Transfer to floor. Appreciate hospitalist consult with tachycardia/ hypertension. Clamp NG - early bowel function is returning.  Wilmon Arms. Corliss Skains, MD, [facility]  01/10/2012 10:15 AM

## 2012-01-10 NOTE — Progress Notes (Signed)
Initial visit with pt on referral from family support in lobby yesterday afternoon and chaplain.   Provided emotional and spiritual support for pt and family.  Provided prayer with pt, family and member of pt's congregation.    Pt is well-supported by family and congregation.  Pt normally has special-needs daughter living with her. This daughter is staying with another of the pt's daughters at this point.   Will continue to follow.  Please page as needs arise.

## 2012-01-10 NOTE — Evaluation (Signed)
Occupational Therapy Evaluation Patient Details Name: Laurie Galloway MRN: [id number] DOB: 08/13/1930 Today's Date: 01/10/2012  Problem List:  Patient Active Problem List  Diagnoses  . Small bowel obstruction  . HTN (hypertension)  . Tachycardia  . Gastrointestinal bleeding  . Chronic back pain  . Epigastric pain    Past Medical History:  Past Medical History  Diagnosis Date  . Back problem   . Hypertension   . PONV (postoperative nausea and vomiting)    Past Surgical History:  Past Surgical History  Procedure Date  . Back surgery   . Appendectomy   . Abdominal hysterectomy   . Tonsillectomy     age 78    OT Assessment/Plan/Recommendation OT Assessment Clinical Impression Statement: This 78 year old female was admitted with SBO and is s/p resection.  She presents with decreased strength and endurance for ADLs.  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  She is appropriate for skilled OT to reach a modified independent level for BADLs in acute  OT Recommendation/Assessment: Patient will need skilled OT in the acute care venue OT Problem List: Decreased strength;Decreased activity tolerance;Impaired balance (sitting and/or standing);Pain;Decreased knowledge of use of DME or AE;Cardiopulmonary status limiting activity Barriers to Discharge: Other (comment) (states another daughter can help prn) OT Therapy Diagnosis : Generalized weakness OT Plan OT Frequency: Min 2X/week OT Treatment/Interventions: Self-care/ADL training;Energy conservation;DME and/or AE instruction;Therapeutic activities;Patient/family education;Balance training OT Recommendation Follow Up Recommendations: Home health OT;Other (comment) (depending on progress and availability of help).  Pt currently limited by activity tolerance; very motivated Equipment Recommended: None recommended by OT Individuals Consulted Consulted and Agree with Results and  Recommendations: Patient OT Goals Acute Rehab OT Goals OT Goal Formulation: With patient Time For Goal Achievement: 2 weeks ADL Goals Pt Will Perform Grooming: with modified independence;Standing at sink ADL Goal: Grooming - Progress: Goal set today Pt Will Perform Lower Body Bathing: with modified independence;with adaptive equipment;Sit to stand from chair ADL Goal: Lower Body Bathing - Progress: Goal set today Pt Will Perform Lower Body Dressing: with modified independence;Sit to stand from chair;with adaptive equipment ADL Goal: Lower Body Dressing - Progress: Goal set today Pt Will Transfer to Toilet: with modified independence;3-in-1;Ambulation ADL Goal: Toilet Transfer - Progress: Goal set today Pt Will Perform Toileting - Clothing Manipulation: Standing;with modified independence ADL Goal: Toileting - Clothing Manipulation - Progress: Goal set today Pt Will Perform Toileting - Hygiene: Independently;Sit to stand from 3-in-1/toilet ADL Goal: Toileting - Hygiene - Progress: Goal set today Pt Will Perform Tub/Shower Transfer: Shower transfer;with supervision;Other (comment) (3:1) ADL Goal: Tub/Shower Transfer - Progress: Goal set today  OT Evaluation Precautions/Restrictions  Precautions Precautions: Fall Restrictions Weight Bearing Restrictions: No Prior Functioning Home Living Lives With: 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) Receives Help From: Other (Comment) (pt independent; another daughter nearby) Type of Home: House Home Access: Ramped entrance Bathroom Shower/Tub: [employer] manager Toilet: Handicapped height Home Adaptive Equipment: Bedside commode/3-in-1;Other (comment) (uses 3:1 in shower) Prior Function Level of Independence: Independent with basic ADLs;Independent with homemaking with ambulation;Independent with gait ADL ADL Eating/Feeding: NPO Grooming: Simulated;Set up Where Assessed - Grooming: Sitting, chair;Unsupported Upper Body  Bathing: Simulated;Set up Where Assessed - Upper Body Bathing: Unsupported;Sitting, chair Lower Body Bathing: Simulated;Moderate assistance;Other (comment) (with AE:  pt has reacher) Where Assessed - Lower Body Bathing: Sit to stand from chair Upper Body Dressing: Simulated;Minimal assistance;Other (comment) (iv) Where Assessed - Upper Body Dressing:  Sit to stand from chair Lower Body Dressing: Simulated;Moderate assistance;Other (comment) (has reacher; introduced sock aid) Where Assessed - Lower Body Dressing: Sit to stand from chair Toilet Transfer: Performed;Minimal assistance Toilet Transfer Method: Stand pivot Toilet Transfer Equipment: Bedside commode Toileting - Clothing Manipulation: Simulated;Minimal assistance Where Assessed - Toileting Clothing Manipulation: Sit to stand from 3-in-1 or toilet Toileting - Hygiene: Performed;Set up Where Assessed - Toileting Hygiene: Sit on 3-in-1 or toilet Equipment Used: Sock aid ADL Comments:  (educated to side roll for bed mobility; tachy with transfer--118 to 125) Vision/Perception    Cognition Cognition Overall Cognitive Status: Appears within functional limits for tasks assessed Orientation Level: Oriented X4 Sensation/Coordination   Extremity Assessment RUE Assessment RUE Assessment: Within Functional Limits LUE Assessment LUE Assessment: Within Functional Limits Mobility  Bed Mobility Bed Mobility: Yes Rolling Left: 5: Supervision;With rail Left Sidelying to Sit: 4: Min assist;With rails Sit to Sidelying Left: 4: Min assist;With rail Transfers Transfers: Yes Sit to Stand: 4: Min assist;With upper extremity assist;From bed;From chair/3-in-1 Exercises   End of Session OT - End of Session Activity Tolerance: Patient limited by fatigue General Behavior During Session: [facility] for tasks performed Cognition: [facility] for tasks performed Laurie Galloway, OTR/L [phone] 01/10/2012  Laurie Galloway 01/10/2012, 4:34 PM

## 2012-01-10 NOTE — Progress Notes (Signed)
2 Days Post-Op  Subjective: Pt looks good this am. Not much pain. Passed some flatus this am for first time. Not much NG output. Has been OOB to chair, no walking yet.  Objective: Vital signs in last 24 hours: Temp:  [97.3 F (36.3 C)-99.3 F (37.4 C)] 98.8 F (37.1 C) (02/08 0400) Pulse Rate:  [104-125] 106  (02/08 0500) Resp:  [20-30] 26  (02/08 0500) BP: (150-189)/(49-128) 165/73 mmHg (02/08 0500) SpO2:  [92 %-99 %] 95 % (02/08 0500) Weight:  [63 kg (138 lb 14.2 oz)] 63 kg (138 lb 14.2 oz) (02/08 0400) Last BM Date: 01/07/12  Intake/Output this shift:    Physical Exam: BP 165/73  Pulse 106  Temp(Src) 98.8 F (37.1 C) (Oral)  Resp 26  Ht 5\' 3"  (1.6 m)  Wt 63 kg (138 lb 14.2 oz)  BMI 24.60 kg/m2  SpO2 95% Lungs: CTA without w/r/r Heart: Regular Abdomen: soft, ND, appropriately tender   Incision c/d/i without erythema or hematoma.  Ext: No edema or tenderness   Labs: CBC  Basename 01/10/12 0445 01/09/12 0455  WBC 10.0 10.4  HGB 9.4* 10.4*  HCT 28.0* 31.5*  PLT 152 187   BMET  Basename 01/10/12 0445 01/09/12 0455  NA 145 146*  K 3.0* 4.0  CL 115* 120*  CO2 23 23  GLUCOSE 98 130*  BUN 16 24*  CREATININE 0.49* 0.63  CALCIUM 7.6* 7.4*   LFT  Basename 01/08/12 2210 01/07/12 1435  PROT 3.6* --  ALBUMIN 1.8* --  AST 21 --  ALT 13 --  ALKPHOS 38* --  BILITOT 0.3 --  BILIDIR -- --  IBILI -- --  LIPASE -- 23   PT/INR  Basename 01/07/12 2113  LABPROT 15.2  INR 1.18   ABG No results found for this basename: PHART:2,PCO2:2,PO2:2,HCO3:2 in the last 72 hours  Studies/Results: Dg Chest Port 1 View  01/08/2012  *RADIOLOGY REPORT*  Clinical Data: Central line placement.  Status post small bowel resection.  PORTABLE CHEST - 1 VIEW  Comparison: 03/12/2010  Findings: Right jugular central line in place with the tip in the SVC.  No pneumothorax.  Bibasilar atelectasis present.  Nasogastric tube extends into the stomach.  Free air is seen around the liver  likely related to the the laparotomy today.  IMPRESSION: Central line tip in SVC.  No pneumothorax after placement.  Free intraperitoneal air visualized which is likely secondary to laparotomy today.  Original Report Authenticated By: Reola Calkins, M.D.    Assessment: Principal Problem:  *Small bowel obstruction Active Problems:  HTN (hypertension)  Tachycardia  Gastrointestinal bleeding  Chronic back pain  Epigastric pain Hypokalemia  Procedure(s): EXPLORATORY LAPAROTOMY SMALL BOWEL RESECTION  Plan: Clamp NG. DC foley Replete K Poss transfer to floor.  LOS: 3 days    Alyse Low 01/10/2012 7:37 AM

## 2012-01-11 LAB — TYPE AND SCREEN
ABO/RH(D): O POS
Antibody Screen: NEGATIVE
Unit division: 0
Unit division: 0
Unit division: 0
Unit division: 0

## 2012-01-11 LAB — BASIC METABOLIC PANEL
BUN: 14 mg/dL (ref 6–23)
CO2: 24 mEq/L (ref 19–32)
Calcium: 7.7 mg/dL — ABNORMAL LOW (ref 8.4–10.5)
Chloride: 110 mEq/L (ref 96–112)
Creatinine, Ser: 0.38 mg/dL — ABNORMAL LOW (ref 0.50–1.10)
GFR calc Af Amer: >90 mL/min (ref >90)
GFR calc non Af Amer: >90 mL/min (ref >90)
Glucose, Bld: 95 mg/dL (ref 70–99)
Potassium: 3 mEq/L — ABNORMAL LOW (ref 3.5–5.1)
Sodium: 144 mEq/L (ref 135–145)

## 2012-01-11 LAB — MAGNESIUM: Magnesium: 1.8 mg/dL (ref 1.5–2.5)

## 2012-01-11 MED ORDER — BISACODYL 10 MG RE SUPP: 10.0000 mg | Freq: Two times a day (BID) | RECTAL | Status: DC | PRN

## 2012-01-11 MED ORDER — METOPROLOL TARTRATE 1 MG/ML IV SOLN: 5.0000 mg | Freq: Four times a day (QID) | INTRAVENOUS | Status: DC | PRN

## 2012-01-11 MED ORDER — FLORA-Q PO CAPS
1.0000 | ORAL_CAPSULE | Freq: Every day | ORAL | Status: DC
  Administered 2012-01-11 – 2012-01-14 (×4): 1 via ORAL
  Filled 2012-01-11 (×4): qty 1

## 2012-01-11 MED ORDER — DILTIAZEM HCL 30 MG PO TABS
30.0000 mg | ORAL_TABLET | Freq: Four times a day (QID) | ORAL | Status: DC | PRN
  Filled 2012-01-11: qty 2

## 2012-01-11 MED ORDER — DILTIAZEM HCL 60 MG PO TABS
60.0000 mg | ORAL_TABLET | Freq: Four times a day (QID) | ORAL | Status: DC
  Administered 2012-01-11: 60 mg via ORAL
  Filled 2012-01-11 (×4): qty 1

## 2012-01-11 MED ORDER — POTASSIUM CHLORIDE CRYS ER 20 MEQ PO TBCR
40.0000 meq | EXTENDED_RELEASE_TABLET | Freq: Every day | ORAL | Status: DC
  Administered 2012-01-11 – 2012-01-12 (×2): 40 meq via ORAL
  Filled 2012-01-11 (×2): qty 2

## 2012-01-11 MED ORDER — METOPROLOL TARTRATE 1 MG/ML IV SOLN: 5.0000 mg | Freq: Four times a day (QID) | INTRAVENOUS | Status: DC

## 2012-01-11 MED ORDER — POTASSIUM CHLORIDE 2 MEQ/ML IV SOLN
INTRAVENOUS | Status: DC
  Administered 2012-01-11 – 2012-01-12 (×2): via INTRAVENOUS
  Filled 2012-01-11 (×4): qty 1000

## 2012-01-11 MED ORDER — POTASSIUM CHLORIDE 10 MEQ/100ML IV SOLN
10.0000 meq | INTRAVENOUS | Status: AC
  Administered 2012-01-11 (×5): 10 meq via INTRAVENOUS
  Filled 2012-01-11 (×5): qty 100

## 2012-01-11 MED ORDER — METOPROLOL TARTRATE 1 MG/ML IV SOLN
10.0000 mg | Freq: Four times a day (QID) | INTRAVENOUS | Status: DC
  Filled 2012-01-11 (×4): qty 10

## 2012-01-11 MED ORDER — DILTIAZEM HCL ER COATED BEADS 360 MG PO CP24
360.0000 mg | ORAL_CAPSULE | Freq: Every day | ORAL | Status: DC
  Administered 2012-01-11 – 2012-01-13 (×3): 360 mg via ORAL
  Filled 2012-01-11 (×5): qty 1

## 2012-01-11 MED ORDER — LOSARTAN POTASSIUM 25 MG PO TABS
25.0000 mg | ORAL_TABLET | Freq: Every day | ORAL | Status: DC
  Administered 2012-01-11 – 2012-01-14 (×4): 25 mg via ORAL
  Filled 2012-01-11 (×4): qty 1

## 2012-01-11 MED ORDER — METOPROLOL TARTRATE 1 MG/ML IV SOLN
5.0000 mg | Freq: Four times a day (QID) | INTRAVENOUS | Status: DC | PRN
  Filled 2012-01-11: qty 5

## 2012-01-11 MED ORDER — PANTOPRAZOLE SODIUM 40 MG PO TBEC
40.0000 mg | DELAYED_RELEASE_TABLET | Freq: Every day | ORAL | Status: DC
  Administered 2012-01-11 – 2012-01-13 (×3): 40 mg via ORAL
  Filled 2012-01-11 (×4): qty 1

## 2012-01-11 MED ORDER — LIP MEDEX EX OINT
1.0000 "application " | TOPICAL_OINTMENT | Freq: Two times a day (BID) | CUTANEOUS | Status: DC
  Administered 2012-01-11 – 2012-01-13 (×4): 1 via TOPICAL
  Filled 2012-01-11: qty 7

## 2012-01-11 MED ORDER — ACETAMINOPHEN 325 MG PO TABS
650.0000 mg | ORAL_TABLET | Freq: Four times a day (QID) | ORAL | Status: DC
Start: 2012-01-11 — End: 2012-01-12
  Administered 2012-01-11 (×4): 650 mg via ORAL
  Filled 2012-01-11 (×8): qty 2

## 2012-01-11 NOTE — Evaluation (Signed)
Physical Therapy Evaluation Patient Details Name: Laurie Galloway MRN: [id number] DOB: March 09, 1930 Today's Date: 01/11/2012  Recommend HHPT and RW at D/C  Problem List:  Patient Active Problem List  Diagnoses  . Small bowel obstruction  . HTN (hypertension)  . Tachycardia  . Gastrointestinal bleeding  . Chronic back pain  . Epigastric pain    Past Medical History:  Past Medical History  Diagnosis Date  . Back problem   . Hypertension   . PONV (postoperative nausea and vomiting)    Past Surgical History:  Past Surgical History  Procedure Date  . Back surgery   . Appendectomy   . Abdominal hysterectomy   . Tonsillectomy     age 78    PT Assessment/Plan/Recommendation PT Assessment Clinical Impression Statement: Previously active 78 yo now with abdominal OR who is experiencing deconditioning post op.  Anticipate she will improve as food intact and activity gradually increase.  She may need a RW temporarily and would benefit from HHPT and 24/7 assist/supervision at D/C PT Recommendation/Assessment: Patient will need skilled PT in the acute care venue PT Problem List: Decreased strength;Decreased activity tolerance;Decreased knowledge of use of DME Barriers to Discharge: Decreased caregiver support PT Therapy Diagnosis : Difficulty walking;Generalized weakness PT Plan PT Frequency: Min 3X/week PT Treatment/Interventions: DME instruction;Gait training;Functional mobility training;Therapeutic activities;Therapeutic exercise PT Recommendation Recommendations for Other Services: OT consult Follow Up Recommendations: Home health PT Equipment Recommended: Rolling walker with 5" wheels PT Goals  Acute Rehab PT Goals PT Goal Formulation: With patient Time For Goal Achievement: 2 weeks Pt will go Supine/Side to Sit: Independently PT Goal: Supine/Side to Sit - Progress: Goal set today Pt will go Sit to Stand: Independently PT Goal: Sit to Stand - Progress: Goal set today Pt will  Ambulate: >150 feet;with modified independence;with least restrictive assistive device PT Goal: Ambulate - Progress: Goal set today Pt will Perform Home Exercise Program: with supervision, verbal cues required/provided PT Goal: Perform Home Exercise Program - Progress: Goal set today  PT Evaluation Precautions/Restrictions  Precautions Precaution Comments: pt with abdominal incision Required Braces or Orthoses: No Restrictions Weight Bearing Restrictions: No Prior Functioning  Home Living Lives With: 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) Receives Help From: Other (Comment) (pt independent; another daughter nearby) Type of Home: House Home Access: Ramped entrance Bathroom Shower/Tub: [employer] manager Toilet: Handicapped height Home Adaptive Equipment: Bedside commode/3-in-1;Other (comment) (uses 3:1 in shower) Prior Function Level of Independence: Independent with gait Able to Take Stairs?: Yes Cognition Cognition Arousal/Alertness: Awake/alert Overall Cognitive Status: Appears within functional limits for tasks assessed Orientation Level: Oriented X4 Sensation/Coordination Sensation Light Touch: Appears Intact Coordination Gross Motor Movements are Fluid and Coordinated: Yes Fine Motor Movements are Fluid and Coordinated: Yes Extremity Assessment RLE Assessment RLE Assessment: Within Functional Limits LLE Assessment LLE Assessment: Within Functional Limits Mobility (including Balance) Bed Mobility Bed Mobility: Yes Rolling Left: 4: Min assist Left Sidelying to Sit: 4: Min assist;With rails Sit to Sidelying Left: 4: Min assist;With rail Transfers Transfers: Yes Sit to Stand: 4: Min assist;From elevated surface;From bed Sit to Stand Details (indicate cue type and reason): pt able to move, just feels very weak because she hasn't eaten much Ambulation/Gait Ambulation/Gait: No (did not progress gait due to decreased O2 sats, Increased HR) Stairs:  No Wheelchair Mobility Wheelchair Mobility: No  Posture/Postural Control Posture/Postural Control: No significant limitations Balance Balance Assessed: No Exercise    End of Session PT - End of Session Equipment Utilized During Treatment:  (  RW) Activity Tolerance: Other (comment) (limited by decreased O2 sat and Increased HR with activity) Patient left: in chair;with call bell in reach Nurse Communication: Mobility status for transfers;Mobility status for ambulation General Behavior During Session: [facility] for tasks performed Cognition: [facility] for tasks performed  Donnetta Hail 01/11/2012, 1:23 PM

## 2012-01-11 NOTE — Progress Notes (Signed)
Laurie Galloway May 06, 1930 [id number]  PCP: No primary provider on file. Outpatient Care Team: Patient has no care team.  Inpatient Treatment Team: Treatment Team: Attending Provider: Bishop Limbo, MD; Consulting Physician: Bishop Limbo, MD; Consulting Physician: Jessie Foot, MD; Consulting Physician: Pleas Koch, MD; Registered Nurse: Marvia Pickles, RN; Respiratory Therapist: Langley Adie, RRT; Respiratory Therapist: Higinio Plan, RRT; Technician: Vella Raring, NT; Technician: Michelene Heady, NT; Technician: Mal Misty, NT  Subjective:  Transferred to floor.  IM changed Bblocker IV to dilt IV.  Pharm concerned w dosing.  HR still 100-120.  IM referring back to surgery on what to do...  Pt w/o complaints. NO N/V & no residuals w NGT clamped x 24hr Pain minimal Feeling better  Objective:  Vital signs:  Temp:  [97.7 F (36.5 C)-98 F (36.7 C)] 98 F (36.7 C) (02/09 0539) Pulse Rate:  [100-125] 110  (02/09 0539) Resp:  [18-25] 18  (02/09 0539) BP: (132-174)/(56-93) 166/64 mmHg (02/09 0539) SpO2:  [93 %-95 %] 94 % (02/09 0539) Last BM Date: 01/07/12  Intake/Output   Yesterday:  02/08 0701 - 02/09 0700 In: 2448.8 [I.V.:2448.8] Out: 1800 [Urine:1800] This shift:  Total I/O In: -  Out: 400 [Urine:400]  Bowel function:  Flatus: y  BM: n  Physical Exam:  General: Pt awake/alert/oriented x4 in no acute distress Eyes: PERRL, normal EOM.  Sclera clear.  No icterus Neuro: CN II-XII intact w/o focal sensory/motor deficits. Lymph: No head/neck/groin lymphadenopathy Psych:  No delerium/psychosis/paranoia HENT: Normocephalic, Mucus membranes moist.  No thrush Neck: Supple, No tracheal deviation Chest: Clear.  No chest wall pain w good excursion CV:  Pulses intact.  Regular rhythm.  HR 110  Abdomen: Soft, Nontender/Nondistended.  No incarcerated hernias.  Midline incision c/d/i Ext:  SCDs BLE.  No mjr edema.  No cyanosis Skin: No petechiae /  purpurae  Results:   Labs: Results for orders placed during the hospital encounter of 01/07/12 (from the past 48 hour(s))  CBC     Status: Abnormal   Collection Time   01/10/12  4:45 AM      Component Value Range Comment   WBC 10.0  4.0 - 10.5 (K/uL)    RBC 3.15 (*) 3.87 - 5.11 (MIL/uL)    Hemoglobin 9.4 (*) 12.0 - 15.0 (g/dL)    HCT 29.5 (*) 62.1 - 46.0 (%)    MCV 88.9  78.0 - 100.0 (fL)    MCH 29.8  26.0 - 34.0 (pg)    MCHC 33.6  30.0 - 36.0 (g/dL)    RDW 30.8  65.7 - 84.6 (%)    Platelets 152  150 - 400 (K/uL)   BASIC METABOLIC PANEL     Status: Abnormal   Collection Time   01/10/12  4:45 AM      Component Value Range Comment   Sodium 145  135 - 145 (mEq/L)    Potassium 3.0 (*) 3.5 - 5.1 (mEq/L)    Chloride 115 (*) 96 - 112 (mEq/L)    CO2 23  19 - 32 (mEq/L)    Glucose, Bld 98  70 - 99 (mg/dL)    BUN 16  6 - 23 (mg/dL)    Creatinine, Ser 9.62 (*) 0.50 - 1.10 (mg/dL)    Calcium 7.6 (*) 8.4 - 10.5 (mg/dL)    GFR calc non Af Amer >90  >90 (mL/min)    GFR calc Af Amer >90  >90 (mL/min)   BASIC METABOLIC PANEL  Status: Abnormal   Collection Time   01/11/12  3:50 AM      Component Value Range Comment   Sodium 144  135 - 145 (mEq/L)    Potassium 3.0 (*) 3.5 - 5.1 (mEq/L)    Chloride 110  96 - 112 (mEq/L)    CO2 24  19 - 32 (mEq/L)    Glucose, Bld 95  70 - 99 (mg/dL)    BUN 14  6 - 23 (mg/dL)    Creatinine, Ser 0.45 (*) 0.50 - 1.10 (mg/dL)    Calcium 7.7 (*) 8.4 - 10.5 (mg/dL)    GFR calc non Af Amer >90  >90 (mL/min)    GFR calc Af Amer >90  >90 (mL/min)     Imaging / Studies: Dg Chest Port 1 View  01/10/2012  *RADIOLOGY REPORT*  Clinical Data: Atelectasis.  PORTABLE CHEST - 1 VIEW  Comparison: Chest x-ray 01/08/2012.  Findings: Previously noted large volume of pneumoperitoneum is no longer clearly visualized.  Right IJ central venous catheter with tip in the distal superior vena cava unchanged.  The nasogastric tube extends at least to the stomach (tip is below the lower  margin of the image).  Lung volumes are low with persistent bibasilar opacities (favored to represent atelectasis).  Small bilateral pleural effusions are unchanged.  Heart size is mildly enlarged (unchanged).  Atherosclerosis in the thoracic aorta.  Mediastinal contours are distorted by patient rotation.  IMPRESSION:  1.  Support apparatus as above. 2.  Persistent low lung volumes with bibasilar atelectasis and small bilateral pleural effusions. 3.  Previously noted pneumoperitoneum no longer identified. 4.  Atherosclerosis.  Original Report Authenticated By: Florencia Reasons, M.D.    Medications / Allergies: per chart  Antibiotics: Anti-infectives     Start     Dose/Rate Route Frequency Ordered Stop   01/08/12 2130   ertapenem (INVANZ) 1 g in sodium chloride 0.9 % 50 mL IVPB  Status:  Discontinued        1 g 100 mL/hr over 30 Minutes Intravenous Every 24 hours 01/08/12 2124 01/08/12 2129   01/08/12 1700   ertapenem (INVANZ) 1 g in sodium chloride 0.9 % 50 mL IVPB        1 g 100 mL/hr over 30 Minutes Intravenous Every 24 hours 01/08/12 1615            Assessment  Laurie Galloway  78 y.o. female  3 Days Post-Op  Procedure(s): EXPLORATORY LAPAROTOMY SMALL BOWEL RESECTION  Problem List:  Principal Problem:  *Small bowel obstruction Active Problems:  HTN (hypertension)  Tachycardia  Gastrointestinal bleeding  Chronic back pain  Epigastric pain  Recovering OK but w some issues...  Plan: -NGT D/C'd by me - start w liquids -Q6H PO dilt w PRN PO dilt & IV metoprolol as backup -Replace low K.  Check Mag level -Control BP gently - follow -VTE prophylaxis- SCDs, etc -mobilize as tolerated to help recovery if HR & BP stable  Ardeth Sportsman, M.D., F.A.C.S. Gastrointestinal and Minimally Invasive Surgery [facility] [address] [place], [place] [address] [phone] Main / Paging [phone] Voice Mail   01/11/2012

## 2012-01-11 NOTE — Consult Note (Signed)
[facility] CONSULT NOTE  Doing much better.  No n/v.  NG clamped and d/c'd.  Passed 1-2 bloody stools, and has been tolerating sips.  Recommendations Principal Problem:  *Small bowel obstruction-status post exploratory laparotomy and small bowel resection 01/08/12-day #2 postop. Pain management and decision to change to oral feeds per surgeon-NG output minimal.  Consider TPN if one or 2 more days without feeds. Albumin is 1.8. Invanz started status postop given necrotic bowel-surgeon to determine when to discontinue  Active Problems:  HTN (hypertension)-still suboptimally controlled. prn metoprolol 5mg  IV every 6 hourly per surgeon. Home medications include losartan 100 mg and verapamil 360 mg every 24 hours-she is currently getting Prn Diltiazem, Prn Metoprolol and scheduled Dilatiazem PO-Suggest change to PO Diltiazem 360, and use only metoprolol for persisting tachycardia (I have changed this)   Tachycardia-she does have some beats and heart rate goes from the low 100s to 120s.  This is sinus rhythm with some PACs and not atrial fibrillation. This likely is more so tachycardia secondary to rebound from being off her oxygen channel blocker.   Gastrointestinal bleeding-GI has signed off given the fact that she had small bowel obstruction   Chronic back pain-Celebrex discontinued.  Leukocytosis-resolved  Anemia-likely secondary to blood loss, and dilutional effect of fluids  AK I.-urine creatinine trending down from BUN 30-16 today. Would change fluids to D5 saline, given her n.p.o. Status (surgeon to make this decision)  Developing contraction alkalosis-would minimize IVF and monitor. (cut back IVF rate from 75-->50 cc/hr) ARB can worsen renal insufficiency-if her Creat climbs again, would d/c  Hypokalemia-replace  Hypoxia-desats off of O2 to mid-80's per RN-did not have an oxygen requirement prior to this.  As no rales or fever, doubt Pneumonia-but if spikes fever, would get CXR     Hospitalist service will sign off--My Partner Dr. Wandra Mannan will be here tomorrow, if you need any advice re: meds. Thanks for this consult. From my standpoint safe to transfer to telemetry bed.  Nery Kalisz,JAI 01/11/2012, 2:19 PM   Medications: Scheduled Meds:    . acetaminophen  650 mg Oral QID  . antiseptic oral rinse  15 mL Mouth Rinse q12n4p  . chlorhexidine  15 mL Mouth Rinse BID  . diltiazem  60 mg Oral Q6H  . enoxaparin  40 mg Subcutaneous Q24H  . ertapenem  1 g Intravenous Q24H  . Flora-Q  1 capsule Oral Daily  . lip balm  1 application Topical BID  . losartan  25 mg Oral Daily  . pantoprazole  40 mg Oral Q1200  . polyvinyl alcohol  1 drop Both Eyes Daily  . potassium chloride  10 mEq Intravenous Q1 Hr x 5  . potassium chloride  10 mEq Intravenous Q1 Hr x 5  . potassium chloride  40 mEq Oral Daily  . DISCONTD: diltiazem  60 mg Intravenous Q4H  . DISCONTD: metoprolol  10 mg Intravenous Q6H  . DISCONTD: metoprolol  5 mg Intravenous Q6H  . DISCONTD: pantoprazole (PROTONIX) IV  40 mg Intravenous Q12H   Continuous Infusions:    . dextrose 5 % lactated ringers with kcl 75 mL/hr at 01/11/12 1304  . DISCONTD: sodium chloride 75 mL/hr (01/11/12 0836)   PRN Meds:.bisacodyl, diltiazem, metoprolol, morphine, ondansetron, DISCONTD: metoprolol  Objective: Weight change:   Intake/Output Summary (Last 24 hours) at 01/11/12 1419 Last data filed at 01/11/12 1252  Gross per 24 hour  Intake 2448.75 ml  Output   2752 ml  Net -303.25 ml    BP  163/74  Pulse 119  Temp(Src) 98.8 F (37.1 C) (Axillary)  Resp 18  Ht 5\' 3"  (1.6 m)  Wt 63 kg (138 lb 14.2 oz)  BMI 24.60 kg/m2  SpO2 97%  HEENT Alert, orineted CF.  less abdominal pain. CHEST cta b, no added sound CARDS s1 s2 no irreg beats, tachycardic to the 120's on tele. ABD Soft, but slightl painful in epigastrium.  Clean well-healed surgical scar in abdomen.  Bowel sounds significantly decreased.   NEURO alert  oriented moving all 4 limbs equally SKIN no lower extremity edema or other findings  CBC    Component Value Date/Time   WBC 10.0 01/10/2012 0445   RBC 3.15* 01/10/2012 0445   HGB 9.4* 01/10/2012 0445   HCT 28.0* 01/10/2012 0445   PLT 152 01/10/2012 0445   MCV 88.9 01/10/2012 0445   MCH 29.8 01/10/2012 0445   MCHC 33.6 01/10/2012 0445   RDW 14.0 01/10/2012 0445   LYMPHSABS 2.2 01/08/2012 1409   MONOABS 2.1* 01/08/2012 1409   EOSABS 0.0 01/08/2012 1409   BASOSABS 0.0 01/08/2012 1409   BMET    Component Value Date/Time   NA 144 01/11/2012 0350   K 3.0* 01/11/2012 0350   CL 110 01/11/2012 0350   CO2 24 01/11/2012 0350   GLUCOSE 95 01/11/2012 0350   BUN 14 01/11/2012 0350   CREATININE 0.38* 01/11/2012 0350   CALCIUM 7.7* 01/11/2012 0350   GFRNONAA >90 01/11/2012 0350   GFRAA >90 01/11/2012 0350    Studies/Results: Ct Abdomen Pelvis W Contrast  01/07/2012  *RADIOLOGY REPORT*  Clinical Data: Abdominal pain  CT ABDOMEN AND PELVIS WITH CONTRAST  Technique:  Multidetector CT imaging of the abdomen and pelvis was performed following the standard protocol during bolus administration of intravenous contrast.  Contrast: OMNIPAQUE IOHEXOL 300 MG/ML IV SOLN  Comparison: 04/26/2005  Findings: Calcified atherosclerotic disease is noted involving the left anterior descending artery.  There is a small subpleural nodule in the left base which measures 4 mm, image #8.  Unchanged from previous exam. Likely benign.  Interval development of moderate abdominal ascites.  No focal liver abnormality.  Gallbladder appears normal.  The spleen is negative.  Right adrenal nodule measures 2 x 3.1 cm, image 20. Previously this measured 1.7 x 3.1 cm.  Normal appearance of both kidneys.  There is no focal kidney abnormality.  No upper abdominal adenopathy.  There is no pelvic or inguinal adenopathy.  The urinary bladder appears collapsed.  The stomach and proximal small bowel loops have a normal caliber. The mid small bowel loops are increased in caliber  measuring up 2.7 cm.  The terminal ileum appears collapsed.  The transition point is within the central abdomen, image 18 of the coronal series.  There is no abnormal fluid collections identified within the abdomen or pelvis.  Postoperative change compatible with the lumbar fusion extends from L3-L5.  There are compression fractures involving the L2, L3, and L4 vertebra.  IMPRESSION:  1.  Examination is positive for small bowel obstruction with transition point in the central abdomen.  2.  Moderate abdominal ascites. 3.  Right adrenal gland adenoma as described previously.  Original Report Authenticated By: Rosealee Albee, M.D.   Dg Abd 2 Views  01/08/2012  *RADIOLOGY REPORT*  Clinical Data: Small bowel obstruction  ABDOMEN - 2 VIEW  Comparison: CT abdomen pelvis of 01/07/2012  Findings: Supine and erect views of the abdomen show only slightly prominent loops of small bowel.  No significant gaseous  distention of small bowel is seen.  No colonic distention is noted.  Contrast is noted within the urinary bladder.  No free air is seen on the erect view.  Hardware for posterior fusion from L3-L5 is noted.  IMPRESSION: No definite bowel obstruction.  Only slight gaseous distention of a few loops of small bowel.  No free air.  Original Report Authenticated By: Juline Patch, M.D.

## 2012-01-12 LAB — CREATININE, SERUM
Creatinine, Ser: 0.37 mg/dL — ABNORMAL LOW (ref 0.50–1.10)
GFR calc Af Amer: >90 mL/min (ref >90)
GFR calc non Af Amer: >90 mL/min (ref >90)

## 2012-01-12 LAB — POTASSIUM: Potassium: 3.6 mEq/L (ref 3.5–5.1)

## 2012-01-12 MED ORDER — SODIUM CHLORIDE 0.9 % IJ SOLN
10.0000 mL | Freq: Two times a day (BID) | INTRAMUSCULAR | Status: DC
  Administered 2012-01-12 – 2012-01-13 (×2): 10 mL

## 2012-01-12 MED ORDER — SODIUM CHLORIDE 0.9 % IJ SOLN: 10.0000 mL | INTRAMUSCULAR | Status: DC | PRN

## 2012-01-12 MED ORDER — CALCIUM CARBONATE-VITAMIN D 500-200 MG-UNIT PO TABS
1.0000 | ORAL_TABLET | Freq: Every day | ORAL | Status: DC
  Administered 2012-01-12 – 2012-01-14 (×3): 1 via ORAL
  Filled 2012-01-12 (×3): qty 1

## 2012-01-12 MED ORDER — ACETAMINOPHEN 325 MG PO TABS: 325.0000 mg | ORAL_TABLET | Freq: Four times a day (QID) | ORAL | Status: DC | PRN

## 2012-01-12 MED ORDER — TRAMADOL HCL 50 MG PO TABS
50.0000 mg | ORAL_TABLET | Freq: Four times a day (QID) | ORAL | Status: DC | PRN
  Administered 2012-01-13: 50 mg via ORAL
  Filled 2012-01-12: qty 1

## 2012-01-12 MED ORDER — VITAMINS A & D EX OINT
TOPICAL_OINTMENT | CUTANEOUS | Status: AC
  Administered 2012-01-12: 04:00:00
  Filled 2012-01-12: qty 10

## 2012-01-12 MED ORDER — GABAPENTIN 300 MG PO CAPS
300.0000 mg | ORAL_CAPSULE | Freq: Three times a day (TID) | ORAL | Status: DC
  Administered 2012-01-12 – 2012-01-14 (×6): 300 mg via ORAL
  Filled 2012-01-12 (×9): qty 1

## 2012-01-12 MED ORDER — PSYLLIUM 95 % PO PACK
1.0000 | PACK | Freq: Two times a day (BID) | ORAL | Status: DC
  Administered 2012-01-12 – 2012-01-14 (×5): 1 via ORAL
  Filled 2012-01-12 (×6): qty 1

## 2012-01-12 MED ORDER — CELECOXIB 200 MG PO CAPS
200.0000 mg | ORAL_CAPSULE | Freq: Every day | ORAL | Status: DC
  Administered 2012-01-12 – 2012-01-14 (×3): 200 mg via ORAL
  Filled 2012-01-12 (×3): qty 1

## 2012-01-12 MED ORDER — OCUVITE PO TABS
1.0000 | ORAL_TABLET | Freq: Every day | ORAL | Status: DC
  Administered 2012-01-12 – 2012-01-14 (×3): 1 via ORAL
  Filled 2012-01-12 (×3): qty 1

## 2012-01-12 MED ORDER — OMEGA-3-ACID ETHYL ESTERS 1 G PO CAPS
1.0000 g | ORAL_CAPSULE | Freq: Every day | ORAL | Status: DC
  Administered 2012-01-12 – 2012-01-14 (×3): 1 g via ORAL
  Filled 2012-01-12 (×3): qty 1

## 2012-01-12 MED ORDER — BISMUTH SUBSALICYLATE 262 MG/15ML PO SUSP
30.0000 mL | Freq: Three times a day (TID) | ORAL | Status: DC | PRN
  Filled 2012-01-12: qty 236

## 2012-01-12 MED ORDER — POTASSIUM CHLORIDE CRYS ER 20 MEQ PO TBCR
40.0000 meq | EXTENDED_RELEASE_TABLET | Freq: Two times a day (BID) | ORAL | Status: DC
  Administered 2012-01-12 – 2012-01-14 (×4): 40 meq via ORAL
  Filled 2012-01-12 (×5): qty 2

## 2012-01-12 NOTE — Plan of Care (Signed)
Problem: Phase II Progression Outcomes Goal: Tolerating diet Outcome: Progressing Patient has tolerated diet, no v/v

## 2012-01-12 NOTE — Progress Notes (Signed)
CARE MANAGEMENT NOTE 01/12/2012  Patient:  Laurie Galloway, Laurie Galloway   Account Number:  [id number]  Date Initiated:  01/12/2012  Documentation initiated by:  Bevan Disney  Subjective/Objective Assessment:   78 yo female admitted with abd pain, n/v. PTA pt lived with [facility] challenged daughter.     Action/Plan:   Home when stable.   Anticipated DC Date:  01/14/2012   Anticipated DC Plan:  HOME W HOME HEALTH SERVICES  In-house referral  NA      DC Planning Services  CM consult      [facility] Choice  HOME HEALTH   Choice offered to / List presented to:  C-1 Patient   DME arranged  NA      DME agency  NA     HH arranged  HH-2 PT      Status of service:  In process, will continue to follow Medicare Important Message given?   (If response is "NO", the following Medicare IM given date fields will be blank) Date Medicare IM given:   Date Additional Medicare IM given:    Discharge Disposition:    Per UR Regulation:    Comments:  01/12/12 1422 Caleel Kiner,RN,BSN [phone] CM spoke with pt concerning d/c planning. Pt states currently lives alone, caretaker for mentally challenged daughter. Pt states plans include staying with other daughter for [place] to assist with care of pt and mentally challenged daughter.PT recommends HHPT, pt agrees with plan. Pt given choice list, wants to discuss with daughter. pt has access to RW, cane, 3n1. Awaiting pt's choice & MD orders.

## 2012-01-12 NOTE — Progress Notes (Signed)
Laurie Galloway [birth year] [id number]  PCP: No primary provider on file. Outpatient Care Team: Patient has no care team.  Inpatient Treatment Team: Treatment Team: Attending Provider: Md Montez Morita, MD; Consulting Physician: Md Montez Morita, MD; Registered Nurse: Marvia Pickles, RN; Respiratory Therapist: Langley Adie, RRT; Respiratory Therapist: Higinio Plan, RRT; Technician: Vella Raring, NT; Technician: Michelene Heady, NT; Technician: Mal Misty, NT  Subjective: Feeling better Tol clears but too sweet... Pain minimal  Objective:  Vital signs:  Temp:  [98.4 F (36.9 C)-98.9 F (37.2 C)] 98.9 F (37.2 C) (02/10 0515) Pulse Rate:  [109-140] 109  (02/10 0515) Resp:  [18-20] 20  (02/10 0515) BP: (152-163)/(74-80) 152/74 mmHg (02/10 0515) SpO2:  [88 %-97 %] 97 % (02/10 0515) Last BM Date: 01/11/12  Intake/Output   Yesterday:  02/09 0701 - 02/10 0700 In: 2927.5 [P.O.:1400; I.V.:927.5; IV Piggyback:600] Out: 3306 [Urine:3300; Stool:6] This shift:  Total I/O In: 640 [P.O.:640] Out: 801 [Urine:800; Stool:1]  Bowel function:  Flatus: Yes  BM: Many loose BMs  Physical Exam:  General: Pt awake/alert/oriented x4 in no acute distress Eyes: PERRL, normal EOM.  Sclera clear.  No icterus Neuro: CN II-XII intact w/o focal sensory/motor deficits. Lymph: No head/neck/groin lymphadenopathy Psych:  No delerium/psychosis/paranoia HENT: Normocephalic, Mucus membranes moist.  No thrush Neck: Supple, No tracheal deviation Chest: Clear.  No chest wall pain w good excursion CV:  Pulses intact.  Regular rhythm.  HR 110  Abdomen: Soft, Nontender/Nondistended.  No incarcerated hernias.  Midline incision c/d/i Ext:  SCDs BLE.  No mjr edema.  No cyanosis Skin: No petechiae / purpurae  Results:   Labs: Results for orders placed during the hospital encounter of 01/07/12 (from the past 48 hour(s))  BASIC METABOLIC PANEL     Status: Abnormal   Collection Time   01/11/12  3:50 AM      Component Value Range Comment   Sodium 144  135 - 145 (mEq/L)    Potassium 3.0 (*) 3.5 - 5.1 (mEq/L)    Chloride 110  96 - 112 (mEq/L)    CO2 24  19 - 32 (mEq/L)    Glucose, Bld 95  70 - 99 (mg/dL)    BUN 14  6 - 23 (mg/dL)    Creatinine, Ser 1.47 (*) 0.50 - 1.10 (mg/dL)    Calcium 7.7 (*) 8.4 - 10.5 (mg/dL)    GFR calc non Af Amer >90  >90 (mL/min)    GFR calc Af Amer >90  >90 (mL/min)   MAGNESIUM     Status: Normal   Collection Time   01/11/12  3:50 AM      Component Value Range Comment   Magnesium 1.8  1.5 - 2.5 (mg/dL)   POTASSIUM     Status: Normal   Collection Time   01/12/12  5:58 AM      Component Value Range Comment   Potassium 3.6  3.5 - 5.1 (mEq/L)   CREATININE, SERUM     Status: Abnormal   Collection Time   01/12/12  5:58 AM      Component Value Range Comment   Creatinine, Ser 0.37 (*) 0.50 - 1.10 (mg/dL)    GFR calc non Af Amer >90  >90 (mL/min)    GFR calc Af Amer >90  >90 (mL/min)     Imaging / Studies: No results found.  Medications / Allergies: per chart  Antibiotics: Anti-infectives     Start     Dose/Rate Route Frequency Ordered Stop  01/08/12 2130   ertapenem (INVANZ) 1 g in sodium chloride 0.9 % 50 mL IVPB  Status:  Discontinued        1 g 100 mL/hr over 30 Minutes Intravenous Every 24 hours 01/08/12 2124 01/08/12 2129   01/08/12 1700   ertapenem (INVANZ) 1 g in sodium chloride 0.9 % 50 mL IVPB        1 g 100 mL/hr over 30 Minutes Intravenous Every 24 hours 01/08/12 1615            Assessment  Laurie Galloway  78 y.o. female  4 Days Post-Op  Procedure(s): EXPLORATORY LAPAROTOMY SMALL BOWEL RESECTION  Problem List:  Principal Problem:  *Small bowel obstruction Active Problems:  HTN (hypertension)  Tachycardia  Gastrointestinal bleeding  Chronic back pain  Epigastric pain  Ileus resolving  Plan: -Adv diet gradually -PO dilt w PRN IV metoprolol as backup working OK  -Increase replacing low K.  Check Mag  level -VTE prophylaxis- SCDs, etc -mobilize as tolerated to help recovery if HR & BP stable -start PO PRN meds  Ardeth Sportsman, M.D., F.A.C.S. Gastrointestinal and Minimally Invasive Surgery [facility] [address] [place], [place] [address] [phone] Main / Paging [phone] Voice Mail   01/12/2012

## 2012-01-12 NOTE — Progress Notes (Deleted)
CARE MANAGEMENT NOTE 01/12/2012  Patient:  Laurie Galloway, Laurie Galloway   Account Number:  [id number]  Date Initiated:  01/12/2012  Documentation initiated by:  Laurie Galloway  Subjective/Objective Assessment:   78 yo female admitted with abd pain, n/v. PTA pt lived alone.     Action/Plan:   Home when stable.   Anticipated DC Date:  01/14/2012   Anticipated DC Plan:  HOME W HOME HEALTH SERVICES  In-house referral  NA      DC Planning Services  CM consult      [facility] Choice  HOME HEALTH   Choice offered to / List presented to:  C-1 Patient   DME arranged  NA      DME agency  NA     HH arranged  HH-2 PT  HH-4 NURSE'S AIDE      HH agency  [facility]   Status of service:  In process, will continue to follow Medicare Important Message given?   (If response is "NO", the following Medicare IM given date fields will be blank) Date Medicare IM given:   Date Additional Medicare IM given:    Discharge Disposition:    Per UR Regulation:    Comments:  01/12/12 Laurie Galloway [phone] CM spoke with patient with daughter present at bedside concerning d/c planning. PT recommends HHPT, pt agrees with plan, per pt choice [facility] to provide [facility] services. Pt request [facility]. [facility] rep Laurie Galloway notified of referral. Pt to d/c home alone. 4 adult daughters  reside outside of [place]. Daughter given list of private duty agencies r/t to request of caretaker for up to 8 hours daily. Pt states has RW and 3n1 at home. No other HH services or DME requested.

## 2012-01-13 LAB — CREATININE, SERUM
Creatinine, Ser: 0.4 mg/dL — ABNORMAL LOW (ref 0.50–1.10)
GFR calc Af Amer: >90 mL/min (ref >90)
GFR calc non Af Amer: >90 mL/min (ref >90)

## 2012-01-13 LAB — POTASSIUM: Potassium: 3.9 mEq/L (ref 3.5–5.1)

## 2012-01-13 NOTE — Plan of Care (Signed)
Problem: Phase II Progression Outcomes Goal: Progress activity as tolerated unless otherwise ordered Outcome: Progressing Patient ambulated in hall this shift with assist

## 2012-01-13 NOTE — Discharge Summary (Signed)
Physician Discharge Summary  Patient ID: Laurie Galloway MRN: [id number] DOB/AGE: 02/10/1930 78 y.o.  Admit date: 01/07/2012 Discharge date: 01/14/2012  Admission Diagnoses:   1. Small bowel obstruction  2. Right adrenal adenoma/4 mm subpleural nodule on CT  3. Hypertension  4. Abnormal EKG/coronary calcification on CT  5. Chronic back pain/arthritis.  6.. Hx  Verapamil on admission.   Discharge Diagnoses: Ischemic small bowel secondary to obstruction. Principal Problem:  *Small bowel obstruction Active Problems:  HTN (hypertension)  Tachycardia  Gastrointestinal bleeding  Chronic back pain  Epigastric pain   PROCEDURES: Exploratory laparotomy with small bowel resection 01/08/12 Dr. Manus Rudd.   Consults: Dr. Bernette Redbird, and Dr. Ivan Croft Course: Patient is a 77 year old woman who woke up on the day of admission and had abdominal pain after breakfast. She attempted to treated at home with TUMS and Gas-X. She then developed nausea and vomiting. Workup in the emergency room showed normal white count CT scan showed the stomach and proximal bowels of normal caliber. The mid small bowel loops were increased up to 2.7 cm with collapse of the terminal ileum. She was admitted for small bowel obstruction. She was extremely tachycardic and medicine was asked to see and evaluate. She developed some blood in her stool in the emergency room and GI was called. The following a.m. she actually felt better, but her white count started to rise. She became progressively worse and was taken to the operating room the following afternoon by Dr. Manus Rudd. She underwent exploratory laparotomy she doesn't have ischemic small bowel secondary to obstruction and underwent a small bowel resection. She was placed the intensive care unit postoperatively. She was extubated and is actually make quite good progress. She was started on physical therapy occupational therapy. Her diet was advanced as her  ileus resolved. And by 02/10/12 she was advanced to a soft diet, and plans were made for her to be discharged 02/11/12. She generally plans to go home with her daughter. Home on OT, PT and an aide are available.  She has a rolling walker at home. Discussed with DR. Carolynne Edouard and is ready for D/C today.  Continue antibiotics for five more day. Followup: Dr. Manus Rudd in 2 weeks, Staple removal 01/21/12 Dr. Clelia Croft; call for an appointment  Disposition: Home or Self Care   Medication List  As of 01/14/2012  2:00 PM   STOP taking these medications         celecoxib 200 MG capsule         TAKE these medications         acetaminophen 325 MG tablet   Commonly known as: TYLENOL   Take 1-2 tablets (325-650 mg total) by mouth every 6 (six) hours as needed.      amoxicillin-clavulanate 875-125 MG per tablet   Commonly known as: AUGMENTIN   Take 1 tablet by mouth 2 (two) times daily.      beta carotene w/minerals tablet   Take 1 tablet by mouth daily.      calcium-vitamin D 500-200 MG-UNIT per tablet   Commonly known as: OSCAL WITH D   Take 1 tablet by mouth daily.      Flora-Q Caps   Take 1 capsule by mouth daily.      gabapentin 300 MG capsule   Commonly known as: NEURONTIN   Take 300 mg by mouth 3 (three) times daily.      losartan 100 MG tablet   Commonly known as: COZAAR  Take 100 mg by mouth daily.      omega-3 acid ethyl esters 1 G capsule   Commonly known as: LOVAZA   Take 1 g by mouth daily.      psyllium 95 % Pack   Commonly known as: HYDROCIL/METAMUCIL   Take 1 packet by mouth 2 (two) times daily.      SYSTANE 0.4-0.3 % Soln   Generic drug: Polyethyl Glycol-Propyl Glycol   Place 1 drop into both eyes daily.      traMADol 50 MG tablet   Commonly known as: ULTRAM   Take 50 mg by mouth every 6 (six) hours as needed. TAKE 1-2 TABLETS EVERY 6 HOURS AS NEEDED      verapamil 360 MG 24 hr capsule   Commonly known as: VERELAN PM   Take 360 mg by mouth at bedtime.             Follow-up Information    Follow up with Wynona Luna., MD. Schedule an appointment as soon as possible for a visit in 2 weeks. (Calll for fever, redness, pain, or  trouble voiding )    Contact information:   [facility] [address], [address] [place] [address] [phone]       Follow up with Wynona Luna., MD. (You have an appointment for staple removal Tues Feb. 19 @ 2:10, but be at office 1:30 for check in.  Call sooner if you have redness or drainage from wound.  You will be seen in the [facility] clinic.)    Contact information:   [facility] [address], [address] [place] [address] [phone]       Schedule an appointment as soon as possible for a visit with Lupita Raider, MD.   Contact information:   [address] [place] [address] [phone]          Signed: Sherrie George 01/14/2012, 2:00 PM

## 2012-01-13 NOTE — Progress Notes (Signed)
CARE MANAGEMENT NOTE 01/13/2012  Patient:  Laurie Galloway, Laurie Galloway   Account Number:  [id number]  Date Initiated:  01/12/2012  Documentation initiated by:  DAVIS,TYMEEKA  Subjective/Objective Assessment:   78 yo female admitted with abd pain, n/v. PTA pt lived with [facility] challenged daughter.     Action/Plan:   Home when stable.   Anticipated DC Date:  01/16/2012   Anticipated DC Plan:  HOME W HOME HEALTH SERVICES  In-house referral  NA      DC Planning Services  CM consult      [facility] Choice  HOME HEALTH   Choice offered to / List presented to:  C-1 Patient   DME arranged  WALKER - Lavone Nian      DME agency  [facility]     HH arranged  HH-2 PT  HH-3 OT  HH-4 NURSE'S AIDE      HH agency  [facility]   Status of service:  In process, will continue to follow  Comments:  01/13/12.Rochester Serpe RNC-MNN, BSN, [phone]  CM met with pt. Pt states that she has used [facility] in the past for [facility] services and would like to use them again.  Kristen at [facility] contacted with orders and confirmation of services received. 01/12/12 1422 Tymeeka Davis,RN,BSN [phone] CM spoke with pt concerning d/c planning. Pt states currently lives alone, caretaker for mentally challenged daughter. Pt states plans include staying with other daughter for [place] to assist with care of pt and mentally challenged daughter.PT recommends HHPT, pt agrees with plan. Pt given choice list, wants to discuss with daughter. pt has access to RW, cane, 3n1. Awaiting pt's choice & MD orders.

## 2012-01-13 NOTE — Progress Notes (Signed)
5 Days Post-Op  Subjective: She is feeling much better, taking full liquids, walking with rolling walker, + BM (5) yesterday. Will need home health, Rolling walker at home, PT, and aide.  Objective: Vital signs in last 24 hours: Temp:  [96.7 F (35.9 C)-98.4 F (36.9 C)] 96.7 F (35.9 C) (02/11 0455) Pulse Rate:  [102-109] 109  (02/11 0455) Resp:  [18] 18  (02/11 0455) BP: (128-180)/(70-78) 128/70 mmHg (02/11 0455) SpO2:  [93 %-96 %] 95 % (02/11 0455) Last BM Date: 01/12/12  Intake/Output from previous day: 02/10 0701 - 02/11 0700 In: 1844.8 [P.O.:1240; I.V.:604.8] Out: 5055 [Urine:5050; Stool:5] Intake/Output this shift: Total I/O In: 120 [P.O.:120] Out: 301 [Urine:300; Stool:1]  General appearance: alert, cooperative and no distress Chest wall: no tenderness, Clear to auscultation GI: soft, non-tender; bowel sounds normal; no masses,  no organomegaly and Incision looks good, brusing RUQ, ? due to Lovenox.  Lab Results:  No results found for this basename: WBC:2,HGB:2,HCT:2,PLT:2 in the last 72 hours  BMET  Basename 01/13/12 0500 01/12/12 0558 01/11/12 0350  NA -- -- 144  K 3.9 3.6 --  CL -- -- 110  CO2 -- -- 24  GLUCOSE -- -- 95  BUN -- -- 14  CREATININE 0.40* 0.37* --  CALCIUM -- -- 7.7*    Lab 01/08/12 2210 01/08/12 1409 01/07/12 1435  AST 21 22 30   ALT 13 17 27   ALKPHOS 38* 61 88  BILITOT 0.3 0.5 0.6  PROT 3.6* 5.3* 7.3  ALBUMIN 1.8* 2.7* 4.1    PT/INR No results found for this basename: LABPROT:2,INR:2 in the last 72 hours   Studies/Results: No results found.  Anti-infectives: Anti-infectives     Start     Dose/Rate Route Frequency Ordered Stop   01/08/12 2130   ertapenem [place]) 1 g in sodium chloride 0.9 % 50 mL IVPB  Status:  Discontinued        1 g 100 mL/hr over 30 Minutes Intravenous Every 24 hours 01/08/12 2124 01/08/12 2129   01/08/12 1700   ertapenem (INVANZ) 1 g in sodium chloride 0.9 % 50 mL IVPB        1 g 100 mL/hr over 30  Minutes Intravenous Every 24 hours 01/08/12 1615           Current Facility-Administered Medications  Medication Dose Route Frequency Provider Last Rate Last Dose  . acetaminophen (TYLENOL) tablet 325-650 mg  325-650 mg Oral Q6H PRN Ardeth Sportsman, MD      . antiseptic oral rinse (BIOTENE) solution 15 mL  15 mL Mouth Rinse q12n4p Wilmon Arms. Tsuei, MD   15 mL at 01/12/12 1309  . beta carotene w/minerals (OCUVITE) tablet 1 tablet  1 tablet Oral Daily Ardeth Sportsman, MD   1 tablet at 01/12/12 1302  . bisacodyl (DULCOLAX) suppository 10 mg  10 mg Rectal Q12H PRN Ardeth Sportsman, MD      . bismuth subsalicylate (PEPTO BISMOL) 262 MG/15ML suspension 30 mL  30 mL Oral Q8H PRN Ardeth Sportsman, MD      . calcium-vitamin D (OSCAL WITH D) 500-200 MG-UNIT per tablet 1 tablet  1 tablet Oral Daily Ardeth Sportsman, MD   1 tablet at 01/12/12 1303  . celecoxib (CELEBREX) capsule 200 mg  200 mg Oral Daily Ardeth Sportsman, MD   200 mg at 01/12/12 1302  . chlorhexidine (PERIDEX) 0.12 % solution 15 mL  15 mL Mouth Rinse BID Wilmon Arms. Tsuei, MD   15 mL at  01/13/12 0824  . dextrose 5% lactated ringers 1,000 mL with potassium chloride 40 mEq infusion   Intravenous Continuous Ardeth Sportsman, MD 20 mL/hr at 01/12/12 1113    . diltiazem (CARDIZEM CD) 24 hr capsule 360 mg  360 mg Oral Daily Pleas Koch, MD   360 mg at 01/12/12 1109  . enoxaparin (LOVENOX) injection 40 mg  40 mg Subcutaneous Q24H Wilmon Arms. Tsuei, MD   40 mg at 01/13/12 0824  . ertapenem (INVANZ) 1 g in sodium chloride 0.9 % 50 mL IVPB  1 g Intravenous Q24H Wilmon Arms. Tsuei, MD   1 g at 01/12/12 1732  . Flora-Q (FLORA-Q) Capsule 1 capsule  1 capsule Oral Daily Ardeth Sportsman, MD   1 capsule at 01/12/12 1110  . gabapentin (NEURONTIN) capsule 300 mg  300 mg Oral TID Ardeth Sportsman, MD   300 mg at 01/12/12 2200  . lip balm (CARMEX) ointment 1 application  1 application Topical BID Ardeth Sportsman, MD   1 application at 01/12/12 1112  . losartan (COZAAR)  tablet 25 mg  25 mg Oral Daily Ardeth Sportsman, MD   25 mg at 01/12/12 1110  . metoprolol (LOPRESSOR) injection 5 mg  5 mg Intravenous Q6H PRN Ardeth Sportsman, MD      . morphine 2 MG/ML injection 2-4 mg  2-4 mg Intravenous Q2H PRN Wilmon Arms. Tsuei, MD   2 mg at 01/09/12 1637  . omega-3 acid ethyl esters (LOVAZA) capsule 1 g  1 g Oral Daily Ardeth Sportsman, MD   1 g at 01/12/12 1302  . ondansetron (ZOFRAN) injection 4 mg  4 mg Intravenous Q4H PRN Wilmon Arms. Tsuei, MD      . pantoprazole (PROTONIX) EC tablet 40 mg  40 mg Oral Q1200 Ardeth Sportsman, MD   40 mg at 01/12/12 1303  . polyvinyl alcohol (LIQUIFILM TEARS) 1.4 % ophthalmic solution 1 drop  1 drop Both Eyes Daily Ernestene Mention, MD   1 drop at 01/12/12 1111  . potassium chloride SA (K-DUR,KLOR-CON) CR tablet 40 mEq  40 mEq Oral BID Ardeth Sportsman, MD   40 mEq at 01/12/12 2200  . psyllium (HYDROCIL/METAMUCIL) packet 1 packet  1 packet Oral BID Ardeth Sportsman, MD   1 packet at 01/12/12 2200  . sodium chloride 0.9 % injection 10-40 mL  10-40 mL Intracatheter Q12H Ardeth Sportsman, MD   10 mL at 01/12/12 2200  . sodium chloride 0.9 % injection 10-40 mL  10-40 mL Intracatheter PRN Ardeth Sportsman, MD      . traMADol Janean Sark) tablet 50-100 mg  50-100 mg Oral Q6H PRN Ardeth Sportsman, MD      . DISCONTD: acetaminophen (TYLENOL) tablet 650 mg  650 mg Oral QID Ardeth Sportsman, MD   650 mg at 01/11/12 2201  . DISCONTD: potassium chloride SA (K-DUR,KLOR-CON) CR tablet 40 mEq  40 mEq Oral Daily Ardeth Sportsman, MD   40 mEq at 01/12/12 1318    Assessment/Plan 1. Small bowel obstruction / Ischemic small bowel secondary small bowel obstruction/ Exploratory laparotomy and small bowel resection 01/08/12. 2. Right adrenal adenoma/4 mm subpleural nodule on CT  3. Hypertension  4. Abnormal EKG/coronary calcification on CT  5. Chronic back pain/arthritis.  6.Tachycardia  Plan:  Soft diet, Set up home health needs, saline lock IV and aim for D/C home  tomorrow, recheck labs in AM. They can call me when daughter comes in later. Will  [facility] [phone]  01/13/2012 9:06 AM     LOS: 6 days    Argie Applegate 01/13/2012

## 2012-01-14 LAB — COMPREHENSIVE METABOLIC PANEL
ALT: 37 U/L — ABNORMAL HIGH (ref 0–35)
AST: 42 U/L — ABNORMAL HIGH (ref 0–37)
Albumin: 2.1 g/dL — ABNORMAL LOW (ref 3.5–5.2)
Alkaline Phosphatase: 63 U/L (ref 39–117)
BUN: 5 mg/dL — ABNORMAL LOW (ref 6–23)
CO2: 28 mEq/L (ref 19–32)
Calcium: 8.1 mg/dL — ABNORMAL LOW (ref 8.4–10.5)
Chloride: 105 mEq/L (ref 96–112)
Creatinine, Ser: 0.43 mg/dL — ABNORMAL LOW (ref 0.50–1.10)
GFR calc Af Amer: >90 mL/min (ref >90)
GFR calc non Af Amer: >90 mL/min (ref >90)
Glucose, Bld: 99 mg/dL (ref 70–99)
Potassium: 4.1 mEq/L (ref 3.5–5.1)
Sodium: 138 mEq/L (ref 135–145)
Total Bilirubin: 0.4 mg/dL (ref 0.3–1.2)
Total Protein: 4.5 g/dL — ABNORMAL LOW (ref 6.0–8.3)

## 2012-01-14 LAB — CBC
HCT: 28.6 % — ABNORMAL LOW (ref 36.0–46.0)
Hemoglobin: 9.5 g/dL — ABNORMAL LOW (ref 12.0–15.0)
MCH: 29.1 pg (ref 26.0–34.0)
MCHC: 33.2 g/dL (ref 30.0–36.0)
MCV: 87.7 fL (ref 78.0–100.0)
Platelets: 248 10*3/uL (ref 150–400)
RBC: 3.26 MIL/uL — ABNORMAL LOW (ref 3.87–5.11)
RDW: 13.9 % (ref 11.5–15.5)
WBC: 11.8 10*3/uL — ABNORMAL HIGH (ref 4.0–10.5)

## 2012-01-14 MED ORDER — AMOXICILLIN-POT CLAVULANATE 875-125 MG PO TABS: 1.0000 | ORAL_TABLET | Freq: Two times a day (BID) | ORAL | Status: DC

## 2012-01-14 MED ORDER — PSYLLIUM 95 % PO PACK: 1.0000 | PACK | Freq: Two times a day (BID) | ORAL | Status: DC

## 2012-01-14 MED ORDER — FLORA-Q PO CAPS: 1.0000 | ORAL_CAPSULE | Freq: Every day | ORAL | Status: DC

## 2012-01-14 MED ORDER — AMOXICILLIN-POT CLAVULANATE 875-125 MG PO TABS: 1.0000 | ORAL_TABLET | Freq: Two times a day (BID) | ORAL | Status: AC

## 2012-01-14 MED ORDER — ACETAMINOPHEN 325 MG PO TABS: 325.0000 mg | ORAL_TABLET | Freq: Four times a day (QID) | ORAL | Status: AC | PRN

## 2012-01-14 MED ORDER — AMOXICILLIN-POT CLAVULANATE 875-125 MG PO TABS
1.0000 | ORAL_TABLET | Freq: Two times a day (BID) | ORAL | Status: DC
  Filled 2012-01-14 (×2): qty 1

## 2012-01-14 NOTE — Progress Notes (Signed)
Physical Therapy Treatment Patient Details Name: Laurie Galloway MRN: [id number] DOB: [birth year] Today's Date: 01/14/2012  PT Assessment/Plan  PT - Assessment/Plan Comments on Treatment Session: Pt plans to d/c possibly later today when daughter available to bring her home and stay with her for a few days.  Encouraged pt to continue using RW upon return home.  Pt had no concerns with d/c home. PT Plan: Discharge plan remains appropriate;Frequency remains appropriate Follow Up Recommendations: Home health PT Equipment Recommended: Rolling walker with 5" wheels PT Goals  Acute Rehab PT Goals PT Goal: Supine/Side to Sit - Progress: Progressing toward goal PT Goal: Sit to Stand - Progress: Progressing toward goal PT Goal: Ambulate - Progress: Progressing toward goal  PT Treatment Precautions/Restrictions  Precautions Precautions: Fall Precaution Comments: pt with abdominal incision Required Braces or Orthoses: No Restrictions Weight Bearing Restrictions: No Mobility (including Balance) Bed Mobility Bed Mobility: Yes Rolling Left: 6: Modified independent (Device/Increase time);With rail Left Sidelying to Sit: 6: Modified independent (Device/Increase time);With rails;HOB elevated (comment degrees) Transfers Transfers: Yes Sit to Stand: 5: Supervision;From bed;From toilet;With upper extremity assist Stand to Sit: To chair/3-in-1;To toilet;With upper extremity assist Ambulation/Gait Ambulation/Gait: Yes Ambulation/Gait Assistance: 5: Supervision Ambulation/Gait Assistance Details (indicate cue type and reason): one verbal cue for keeping RW closer to body and posture, pt steady with ambulation however reports she still feels she needs RW Ambulation Distance (Feet): 400 Feet Assistive device: Rolling walker Gait Pattern: Step-through pattern;Decreased stride length;Trunk flexed    Exercise    End of Session PT - End of Session Activity Tolerance: Patient tolerated treatment  well Patient left: in chair;with call bell in reach General Behavior During Session: [facility] for tasks performed Cognition: [facility] for tasks performed  Amsi Grimley,KATHrine E 01/14/2012, 12:59 PM Pager: [phone]

## 2012-01-14 NOTE — Discharge Instructions (Signed)
[facility]      [facility] [phone]  OPEN ABDOMINAL SURGERY: POST OP INSTRUCTIONS  Always review your discharge instruction sheet given to you by the facility where your surgery was performed.  IF YOU HAVE DISABILITY OR FAMILY LEAVE FORMS, YOU MUST BRING THEM TO THE OFFICE FOR PROCESSING.  PLEASE DO NOT GIVE THEM TO YOUR DOCTOR.  1. A prescription for pain medication may be given to you upon discharge.  Take your pain medication as prescribed, if needed.  If narcotic pain medicine is not needed, then you may take acetaminophen (Tylenol) or ibuprofen (Advil) as needed. 2. Take your usually prescribed medications unless otherwise directed. 3. If you need a refill on your pain medication, please contact your pharmacy. They will contact our office to request authorization.  Prescriptions will not be filled after 5pm or on week-ends. 4. You should follow a light diet the first few days after arrival home, such as soup and crackers, pudding, etc.unless your doctor has advised otherwise. A high-fiber, low fat diet can be resumed as tolerated.   Be sure to include lots of fluids daily. Most patients will experience some swelling and bruising on the chest and neck area.  Ice packs will help.  Swelling and bruising can take several days to resolve 5. Most patients will experience some swelling and bruising in the area of the incision. Ice pack will help. Swelling and bruising can take several days to resolve..  6. It is common to experience some constipation if taking pain medication after surgery.  Increasing fluid intake and taking a stool softener will usually help or prevent this problem from occurring.  A mild laxative (Milk of Magnesia or Miralax) should be taken according to package directions if there are no bowel movements after 48 hours. 7.  You may have steri-strips (small skin tapes) in place directly over the incision.  These strips should be left on the skin for 7-10 days.  If your  surgeon used skin glue on the incision, you may shower in 24 hours.  The glue will flake off over the next 2-3 weeks.  Any sutures or staples will be removed at the office during your follow-up visit. You may find that a light gauze bandage over your incision may keep your staples from being rubbed or pulled. You may shower and replace the bandage daily. 8. ACTIVITIES:  You may resume regular (light) daily activities beginning the next day--such as daily self-care, walking, climbing stairs--gradually increasing activities as tolerated.  You may have sexual intercourse when it is comfortable.  Refrain from any heavy lifting or straining until approved by your doctor. a. You may drive when you no longer are taking prescription pain medication, you can comfortably wear a seatbelt, and you can safely maneuver your car and apply brakes b. Return to Work: ___________________________________ 9. You should see your doctor in the office for a follow-up appointment approximately two weeks after your surgery.  Make sure that you call for this appointment within a day or two after you arrive home to insure a convenient appointment time. OTHER INSTRUCTIONS:  _____________________________________________________________ _____________________________________________________________  WHEN TO CALL YOUR DOCTOR: 1. Fever over 101.0 2. Inability to urinate 3. Nausea and/or vomiting 4. Extreme swelling or bruising 5. Continued bleeding from incision. 6. Increased pain, redness, or drainage from the incision. 7. Difficulty swallowing or breathing 8. Muscle cramping or spasms. 9. Numbness or tingling in hands or feet or around lips.  The clinic staff is available to   answer your questions during regular business hours.  Please don't hesitate to call and ask to speak to one of the nurses if you have concerns.  For further questions, please visit [email or web address]   

## 2012-01-14 NOTE — Progress Notes (Signed)
Occupational Therapy Treatment Patient Details Name: Laurie Galloway MRN: [id number] DOB: 1930/10/13 Today's Date: 01/14/2012 1135-1156 1SC OT Assessment/Plan OT Assessment/Plan Comments on Treatment Session: Pt progressing well with OT despite SOB and increased heart rate. Pt demonstrates good insight into limitations taking rest breaks as appropriate.  OT Plan: Discharge plan remains appropriate OT Frequency: Min 2X/week Follow Up Recommendations: Home health OT Equipment Recommended: Rolling walker with 5" wheels OT Goals ADL Goals ADL Goal: Grooming - Progress: Progressing toward goals ADL Goal: Lower Body Bathing - Progress: Progressing toward goals ADL Goal: Lower Body Dressing - Progress: Progressing toward goals ADL Goal: Toilet Transfer - Progress: Progressing toward goals ADL Goal: Toileting - Clothing Manipulation - Progress: Progressing toward goals ADL Goal: Toileting - Hygiene - Progress: Progressing toward goals ADL Goal: Tub/Shower Transfer - Progress: Progressing toward goals  OT Treatment Precautions/Restrictions      ADL ADL Grooming: Performed;Wash/dry hands;Supervision/safety Where Assessed - Grooming: Standing at sink Upper Body Bathing: Performed;Supervision/safety Where Assessed - Upper Body Bathing: Sitting, chair;Unsupported Lower Body Bathing: Performed;Supervision/safety Where Assessed - Lower Body Bathing: Sit to stand from chair Upper Body Dressing: Performed;Set up Where Assessed - Upper Body Dressing: Standing Lower Body Dressing: Performed;Minimal assistance Lower Body Dressing Details (indicate cue type and reason): needed for L sock to pull around heel. Where Assessed - Lower Body Dressing: Sit to stand from chair Toilet Transfer: Performed;Supervision/safety Toilet Transfer Method: Proofreader: Regular height toilet;Grab bars Toileting - Clothing Manipulation: Performed;Supervision/safety Where Assessed - Toileting  Clothing Manipulation: Sit to stand from 3-in-1 or toilet Toileting - Hygiene: Performed;Supervision/safety Where Assessed - Toileting Hygiene: Sit to stand from 3-in-1 or toilet Tub/Shower Transfer: Performed;Other (comment) (Minguard A) Tub/Shower Transfer Method: Anterior-posterior Tub/Shower Transfer Equipment: Walk in shower ADL Comments: Pt demonstrates 2/4 dyspnea upon minimal exertion. Was able to verbalize 2 energy conservation strategies during session. Overall tolerated well. Mobility  Transfers Transfers: Yes Sit to Stand: 5: Supervision;From chair/3-in-1;From toilet;With upper extremity assist;With armrests Stand to Sit: 5: Supervision;With upper extremity assist;To toilet;To chair/3-in-1;With armrests Exercises    End of Session OT - End of Session Equipment Utilized During Treatment: Other (comment) (RW) Activity Tolerance: Patient tolerated treatment well Patient left: in chair;with call bell in reach General Behavior During Session: [facility] for tasks performed Cognition: [facility] for tasks performed  Dirk Vanaman A OTR/L [phone] 01/14/2012, 12:02 PM

## 2012-01-14 NOTE — Progress Notes (Signed)
6 Days Post-Op  Subjective: Feels pretty good.  Some burning discomfort on places on abdomen, but no erythema. Doing well on low fiber diet.  Objective: Vital signs in last 24 hours: Temp:  [97.3 F (36.3 C)-98.6 F (37 C)] 98 F (36.7 C) (02/12 0600) Pulse Rate:  [98-109] 98  (02/12 0600) Resp:  [16-18] 16  (02/12 0600) BP: (137-150)/(73-77) 150/75 mmHg (02/12 0600) SpO2:  [97 %-98 %] 98 % (02/12 0600) Last BM Date: 01/13/12  Intake/Output from previous day: 02/11 0701 - 02/12 0700 In: 1759.5 [P.O.:1250; I.V.:409.5; IV Piggyback:100] Out: 3152 [Urine:3150; Stool:2] Intake/Output this shift: Total I/O In: 240 [P.O.:240] Out: 600 [Urine:600]  PE:  Alert, walking with PT.  Chest clear  Abd:  Soft, sl tender at top of incision.  No erythema or drainage. BM yesterday, getting back to normal. +BS.  Lab Results:   [facility] 01/14/12 0510  WBC 11.8*  HGB 9.5*  HCT 28.6*  PLT 248    BMET  Basename 01/14/12 0510 01/13/12 0500  NA 138 --  K 4.1 3.9  CL 105 --  CO2 28 --  GLUCOSE 99 --  BUN 5* --  CREATININE 0.43* 0.40*  CALCIUM 8.1* --   PT/INR No results found for this basename: LABPROT:2,INR:2 in the last 72 hours   Lab 01/14/12 0510 01/08/12 2210 01/08/12 1409 01/07/12 1435  AST 42* 21 22 30   ALT 37* 13 17 27   ALKPHOS 63 38* 61 88  BILITOT 0.4 0.3 0.5 0.6  PROT 4.5* 3.6* 5.3* 7.3  ALBUMIN 2.1* 1.8* 2.7* 4.1    Studies/Results: No results found.  Anti-infectives: Anti-infectives     Start     Dose/Rate Route Frequency Ordered Stop   01/08/12 2130   ertapenem [facility]) 1 g in sodium chloride 0.9 % 50 mL IVPB  Status:  Discontinued        1 g 100 mL/hr over 30 Minutes Intravenous Every 24 hours 01/08/12 2124 01/08/12 2129   01/08/12 1700   ertapenem (INVANZ) 1 g in sodium chloride 0.9 % 50 mL IVPB        1 g 100 mL/hr over 30 Minutes Intravenous Every 24 hours 01/08/12 1615           Current Facility-Administered Medications  Medication Dose Route  Frequency Provider Last Rate Last Dose  . acetaminophen (TYLENOL) tablet 325-650 mg  325-650 mg Oral Q6H PRN Ardeth Sportsman, MD      . antiseptic oral rinse (BIOTENE) solution 15 mL  15 mL Mouth Rinse q12n4p Wilmon Arms. Tsuei, MD   15 mL at 01/13/12 1630  . beta carotene w/minerals (OCUVITE) tablet 1 tablet  1 tablet Oral Daily Ardeth Sportsman, MD   1 tablet at 01/14/12 1048  . bisacodyl (DULCOLAX) suppository 10 mg  10 mg Rectal Q12H PRN Ardeth Sportsman, MD      . bismuth subsalicylate (PEPTO BISMOL) 262 MG/15ML suspension 30 mL  30 mL Oral Q8H PRN Ardeth Sportsman, MD      . calcium-vitamin D (OSCAL WITH D) 500-200 MG-UNIT per tablet 1 tablet  1 tablet Oral Daily Ardeth Sportsman, MD   1 tablet at 01/14/12 1049  . celecoxib (CELEBREX) capsule 200 mg  200 mg Oral Daily Ardeth Sportsman, MD   200 mg at 01/14/12 1049  . chlorhexidine (PERIDEX) 0.12 % solution 15 mL  15 mL Mouth Rinse BID Wilmon Arms. Tsuei, MD   15 mL at 01/14/12 0753  . dextrose 5% lactated  ringers 1,000 mL with potassium chloride 40 mEq infusion   Intravenous Continuous Sherrie George, PA 20 mL/hr at 01/12/12 1113    . diltiazem (CARDIZEM CD) 24 hr capsule 360 mg  360 mg Oral Daily Pleas Koch, MD   360 mg at 01/13/12 0945  . enoxaparin (LOVENOX) injection 40 mg  40 mg Subcutaneous Q24H Wilmon Arms. Tsuei, MD   40 mg at 01/14/12 0753  . ertapenem (INVANZ) 1 g in sodium chloride 0.9 % 50 mL IVPB  1 g Intravenous Q24H Wilmon Arms. Tsuei, MD   1 g at 01/13/12 1630  . Flora-Q (FLORA-Q) Capsule 1 capsule  1 capsule Oral Daily Ardeth Sportsman, MD   1 capsule at 01/14/12 1048  . gabapentin (NEURONTIN) capsule 300 mg  300 mg Oral TID Ardeth Sportsman, MD   300 mg at 01/14/12 1049  . lip balm (CARMEX) ointment 1 application  1 application Topical BID Ardeth Sportsman, MD   1 application at 01/13/12 2136  . losartan (COZAAR) tablet 25 mg  25 mg Oral Daily Ardeth Sportsman, MD   25 mg at 01/14/12 1048  . metoprolol (LOPRESSOR) injection 5 mg  5 mg  Intravenous Q6H PRN Ardeth Sportsman, MD      . morphine 2 MG/ML injection 2-4 mg  2-4 mg Intravenous Q2H PRN Wilmon Arms. Tsuei, MD   2 mg at 01/09/12 1637  . omega-3 acid ethyl esters (LOVAZA) capsule 1 g  1 g Oral Daily Ardeth Sportsman, MD   1 g at 01/14/12 1048  . ondansetron (ZOFRAN) injection 4 mg  4 mg Intravenous Q4H PRN Wilmon Arms. Tsuei, MD      . pantoprazole (PROTONIX) EC tablet 40 mg  40 mg Oral Q1200 Ardeth Sportsman, MD   40 mg at 01/13/12 1221  . polyvinyl alcohol (LIQUIFILM TEARS) 1.4 % ophthalmic solution 1 drop  1 drop Both Eyes Daily Ernestene Mention, MD   1 drop at 01/14/12 1052  . potassium chloride SA (K-DUR,KLOR-CON) CR tablet 40 mEq  40 mEq Oral BID Ardeth Sportsman, MD   40 mEq at 01/14/12 1048  . psyllium (HYDROCIL/METAMUCIL) packet 1 packet  1 packet Oral BID Ardeth Sportsman, MD   1 packet at 01/14/12 1048  . sodium chloride 0.9 % injection 10-40 mL  10-40 mL Intracatheter Q12H Ardeth Sportsman, MD   10 mL at 01/13/12 0946  . sodium chloride 0.9 % injection 10-40 mL  10-40 mL Intracatheter PRN Ardeth Sportsman, MD      . traMADol Janean Sark) tablet 50-100 mg  50-100 mg Oral Q6H PRN Ardeth Sportsman, MD   50 mg at 01/13/12 2135    Assessment/Plan 1. Small bowel obstruction / Ischemic small bowel secondary small bowel obstruction/ Exploratory laparotomy and small bowel resection 01/08/12.  2. Right adrenal adenoma/4 mm subpleural nodule on CT  3. Hypertension  4. Abnormal EKG/coronary calcification on CT  5. Chronic back pain/arthritis.  6.Tachycardia 7.  WBC is up but otherwise she looks great.   Plan:  D/C central line,,See how she does, probably D/C later today  LOS: 7 days    Schon Zeiders 01/14/2012

## 2012-01-17 ENCOUNTER — Telehealth (INDEPENDENT_AMBULATORY_CARE_PROVIDER_SITE_OTHER): Payer: Self-pay

## 2012-01-17 NOTE — Telephone Encounter (Signed)
The daughter called because her mom is having frequent bowel movements daily that are still loose as they were in the hospital.  She is taking Psyllium bid.  She has no fever and no pain or cramps.  She is eating and drinking ok.  I paged Will Marlyne Beards the PA and he said this could be the result of Augmentin and should get better but she can cut back on the Psyllium enough to just have 1 soft bm a day.  He also states she needs to see Dr Corliss Skains in follow up not the [facility].  I notified the daughter and rescheduled the appointment for 2/19 with Dr Corliss Skains.

## 2012-01-20 ENCOUNTER — Encounter (INDEPENDENT_AMBULATORY_CARE_PROVIDER_SITE_OTHER): Payer: Self-pay | Admitting: Surgery

## 2012-01-21 ENCOUNTER — Ambulatory Visit (INDEPENDENT_AMBULATORY_CARE_PROVIDER_SITE_OTHER): Payer: Medicare Other | Admitting: Surgery

## 2012-01-21 ENCOUNTER — Encounter (INDEPENDENT_AMBULATORY_CARE_PROVIDER_SITE_OTHER): Payer: Self-pay

## 2012-01-21 ENCOUNTER — Encounter (INDEPENDENT_AMBULATORY_CARE_PROVIDER_SITE_OTHER): Payer: Self-pay | Admitting: Surgery

## 2012-01-21 VITALS — BP 146/92 | HR 82 | Temp 98.6°F | Resp 18 | Ht 63.0 in | Wt 122.4 lb

## 2012-01-21 DIAGNOSIS — K56609 Unspecified intestinal obstruction, unspecified as to partial versus complete obstruction: Secondary | ICD-10-CM

## 2012-01-21 NOTE — Progress Notes (Signed)
This patient is status post emergent exploratory laparotomy on 01/08/12 for a closed loop small bowel obstruction. We resected a short segment of small bowel. The patient progressed as expected and was discharged home on 01/14/12. She is doing quite well. She is only complaining of a little bit of soreness around her incision. She also states that food does not taste right. Her appetite is slowly improving. She is having regular daily bowel movements. The diarrhea has resolved.  Filed Vitals:   01/21/12 1513  BP: 146/92  Pulse: 82  Temp: 98.6 F (37 C)  Resp: 18    WDWN in NAD Abd - soft, non-tender, non distended Incision - c/d/i;   Staples removed and replaced with steri-strips.  Imp:  S/p exploratory laparotomy with small bowel resection for closed loop obstruction Doing quite well  Recs:  The patient may shower over her steri-strips.  No heavy lifting.  Encourage ambulation.  Follow-up in 2-3 weeks.  Wilmon Arms. Corliss Skains, MD, [facility] Surgery  01/21/2012 4:22 PM

## 2012-01-31 ENCOUNTER — Encounter (HOSPITAL_COMMUNITY): Payer: Self-pay | Admitting: Surgery

## 2012-02-11 ENCOUNTER — Encounter (INDEPENDENT_AMBULATORY_CARE_PROVIDER_SITE_OTHER): Payer: Self-pay | Admitting: Surgery

## 2012-02-12 ENCOUNTER — Encounter (INDEPENDENT_AMBULATORY_CARE_PROVIDER_SITE_OTHER): Payer: Self-pay | Admitting: Surgery

## 2012-02-12 ENCOUNTER — Ambulatory Visit (INDEPENDENT_AMBULATORY_CARE_PROVIDER_SITE_OTHER): Payer: Medicare Other | Admitting: Surgery

## 2012-02-12 VITALS — BP 124/63 | HR 86 | Temp 96.4°F | Ht 63.0 in | Wt 122.6 lb

## 2012-02-12 DIAGNOSIS — K56609 Unspecified intestinal obstruction, unspecified as to partial versus complete obstruction: Secondary | ICD-10-CM

## 2012-02-12 NOTE — Progress Notes (Signed)
S/p emergent exploratory laparotomy for closed loop SBO on 01/08/12.  The patient is doing much better.  Appetite is improving.  No abdominal pain.  She is having daily bowel movements with her daily fiber supplement.  Her incision is well-healed with no sign of infection.    Full activity Follow-up PRN.  Wilmon Arms. Corliss Skains, MD, [facility] Surgery  02/12/2012 4:17 PM

## 2012-04-01 ENCOUNTER — Emergency Department (HOSPITAL_COMMUNITY): Payer: Medicare Other

## 2012-04-01 ENCOUNTER — Inpatient Hospital Stay (HOSPITAL_COMMUNITY)
Admission: EM | Admit: 2012-04-01 | Discharge: 2012-04-08 | DRG: 390 | Disposition: A | Discharge status: Home Health | Payer: Medicare Other | Attending: General Surgery | Admitting: General Surgery

## 2012-04-01 ENCOUNTER — Encounter (HOSPITAL_COMMUNITY): Payer: Self-pay | Admitting: Family Medicine

## 2012-04-01 DIAGNOSIS — R000 Tachycardia, unspecified: Secondary | ICD-10-CM | POA: Diagnosis present

## 2012-04-01 DIAGNOSIS — I10 Essential (primary) hypertension: Secondary | ICD-10-CM | POA: Diagnosis present

## 2012-04-01 DIAGNOSIS — K56609 Unspecified intestinal obstruction, unspecified as to partial versus complete obstruction: Principal | ICD-10-CM | POA: Diagnosis present

## 2012-04-01 DIAGNOSIS — G8929 Other chronic pain: Secondary | ICD-10-CM | POA: Diagnosis present

## 2012-04-01 DIAGNOSIS — R1013 Epigastric pain: Secondary | ICD-10-CM | POA: Diagnosis present

## 2012-04-01 DIAGNOSIS — K56600 Partial intestinal obstruction, unspecified as to cause: Secondary | ICD-10-CM | POA: Diagnosis present

## 2012-04-01 DIAGNOSIS — M549 Dorsalgia, unspecified: Secondary | ICD-10-CM | POA: Diagnosis present

## 2012-04-01 DIAGNOSIS — Z9049 Acquired absence of other specified parts of digestive tract: Secondary | ICD-10-CM

## 2012-04-01 LAB — CBC
HCT: 40.3 % (ref 36.0–46.0)
Hemoglobin: 13.6 g/dL (ref 12.0–15.0)
MCH: 29.9 pg (ref 26.0–34.0)
MCHC: 33.7 g/dL (ref 30.0–36.0)
MCV: 88.6 fL (ref 78.0–100.0)
Platelets: 276 10*3/uL (ref 150–400)
RBC: 4.55 MIL/uL (ref 3.87–5.11)
RDW: 14.4 % (ref 11.5–15.5)
WBC: 8.7 10*3/uL (ref 4.0–10.5)

## 2012-04-01 LAB — URINALYSIS, ROUTINE W REFLEX MICROSCOPIC
Bilirubin Urine: NEGATIVE
Glucose, UA: NEGATIVE mg/dL
Hgb urine dipstick: NEGATIVE
Ketones, ur: 40 mg/dL — AB
Leukocytes, UA: NEGATIVE
Nitrite: NEGATIVE
Protein, ur: NEGATIVE mg/dL
Specific Gravity, Urine: 1.023 (ref 1.005–1.030)
Urobilinogen, UA: 0.2 mg/dL (ref 0.0–1.0)
pH: 7 (ref 5.0–8.0)

## 2012-04-01 LAB — COMPREHENSIVE METABOLIC PANEL
ALT: 26 U/L (ref 0–35)
AST: 33 U/L (ref 0–37)
Albumin: 4.5 g/dL (ref 3.5–5.2)
Alkaline Phosphatase: 85 U/L (ref 39–117)
BUN: 19 mg/dL (ref 6–23)
CO2: 25 mEq/L (ref 19–32)
Calcium: 9.1 mg/dL (ref 8.4–10.5)
Chloride: 98 mEq/L (ref 96–112)
Creatinine, Ser: 0.54 mg/dL (ref 0.50–1.10)
GFR calc Af Amer: >90 mL/min (ref >90)
GFR calc non Af Amer: 87 mL/min — ABNORMAL LOW (ref >90)
Glucose, Bld: 136 mg/dL — ABNORMAL HIGH (ref 70–99)
Potassium: 3.9 mEq/L (ref 3.5–5.1)
Sodium: 134 mEq/L — ABNORMAL LOW (ref 135–145)
Total Bilirubin: 0.6 mg/dL (ref 0.3–1.2)
Total Protein: 7.2 g/dL (ref 6.0–8.3)

## 2012-04-01 LAB — DIFFERENTIAL
Basophils Absolute: 0 10*3/uL (ref 0.0–0.1)
Basophils Relative: 0 % (ref 0–1)
Eosinophils Absolute: 0 10*3/uL (ref 0.0–0.7)
Eosinophils Relative: 0 % (ref 0–5)
Lymphocytes Relative: 7 % — ABNORMAL LOW (ref 12–46)
Lymphs Abs: 0.6 10*3/uL — ABNORMAL LOW (ref 0.7–4.0)
Monocytes Absolute: 0.2 10*3/uL (ref 0.1–1.0)
Monocytes Relative: 3 % (ref 3–12)
Neutro Abs: 7.9 10*3/uL — ABNORMAL HIGH (ref 1.7–7.7)
Neutrophils Relative %: 90 % — ABNORMAL HIGH (ref 43–77)

## 2012-04-01 LAB — LIPASE, BLOOD: Lipase: 21 U/L (ref 11–59)

## 2012-04-01 MED ORDER — IOHEXOL 300 MG/ML  SOLN
100.0000 mL | Freq: Once | INTRAMUSCULAR | Status: AC | PRN
  Administered 2012-04-01: 100 mL via INTRAVENOUS

## 2012-04-01 MED ORDER — SODIUM CHLORIDE 0.9 % IV BOLUS (SEPSIS)
500.0000 mL | Freq: Once | INTRAVENOUS | Status: AC
  Administered 2012-04-01: 07:00:00 via INTRAVENOUS

## 2012-04-01 MED ORDER — ONDANSETRON HCL 4 MG/2ML IJ SOLN
INTRAMUSCULAR | Status: AC
  Filled 2012-04-01: qty 2

## 2012-04-01 MED ORDER — ENOXAPARIN SODIUM 40 MG/0.4ML ~~LOC~~ SOLN
40.0000 mg | SUBCUTANEOUS | Status: DC
  Administered 2012-04-01 – 2012-04-07 (×7): 40 mg via SUBCUTANEOUS
  Filled 2012-04-01 (×8): qty 0.4

## 2012-04-01 MED ORDER — ONDANSETRON HCL 4 MG/2ML IJ SOLN
4.0000 mg | Freq: Once | INTRAMUSCULAR | Status: AC
  Administered 2012-04-01: 4 mg via INTRAVENOUS

## 2012-04-01 MED ORDER — VERAPAMIL HCL ER 180 MG PO TBCR
360.0000 mg | EXTENDED_RELEASE_TABLET | Freq: Every day | ORAL | Status: DC
  Administered 2012-04-04 – 2012-04-05 (×2): 360 mg via ORAL
  Filled 2012-04-01 (×6): qty 2

## 2012-04-01 MED ORDER — ACETAMINOPHEN 650 MG RE SUPP: 650.0000 mg | Freq: Four times a day (QID) | RECTAL | Status: DC | PRN

## 2012-04-01 MED ORDER — PANTOPRAZOLE SODIUM 40 MG IV SOLR
40.0000 mg | Freq: Every day | INTRAVENOUS | Status: DC
  Administered 2012-04-01 – 2012-04-07 (×7): 40 mg via INTRAVENOUS
  Filled 2012-04-01 (×8): qty 40

## 2012-04-01 MED ORDER — FENTANYL CITRATE 0.05 MG/ML IJ SOLN
50.0000 ug | Freq: Once | INTRAMUSCULAR | Status: AC
  Administered 2012-04-01: 50 ug via INTRAVENOUS
  Filled 2012-04-01: qty 2

## 2012-04-01 MED ORDER — FENTANYL CITRATE 0.05 MG/ML IJ SOLN
25.0000 ug | INTRAMUSCULAR | Status: DC | PRN
  Administered 2012-04-01 – 2012-04-05 (×23): 50 ug via INTRAVENOUS
  Administered 2012-04-06 – 2012-04-07 (×2): 25 ug via INTRAVENOUS
  Administered 2012-04-08: 50 ug via INTRAVENOUS
  Filled 2012-04-01 (×27): qty 2

## 2012-04-01 MED ORDER — LOSARTAN POTASSIUM 50 MG PO TABS
100.0000 mg | ORAL_TABLET | Freq: Every day | ORAL | Status: DC
  Administered 2012-04-04 – 2012-04-08 (×5): 100 mg via ORAL
  Filled 2012-04-01 (×8): qty 2

## 2012-04-01 MED ORDER — ONDANSETRON HCL 4 MG/2ML IJ SOLN
4.0000 mg | Freq: Once | INTRAMUSCULAR | Status: AC
  Administered 2012-04-01: 4 mg via INTRAVENOUS
  Filled 2012-04-01: qty 2

## 2012-04-01 MED ORDER — FENTANYL CITRATE 0.05 MG/ML IJ SOLN
50.0000 ug | Freq: Once | INTRAMUSCULAR | Status: AC
  Administered 2012-04-01: 07:00:00 via INTRAVENOUS
  Filled 2012-04-01: qty 2

## 2012-04-01 MED ORDER — ONDANSETRON HCL 4 MG/2ML IJ SOLN
4.0000 mg | Freq: Four times a day (QID) | INTRAMUSCULAR | Status: DC | PRN
  Administered 2012-04-01 – 2012-04-03 (×6): 4 mg via INTRAVENOUS
  Filled 2012-04-01 (×6): qty 2

## 2012-04-01 MED ORDER — KCL IN DEXTROSE-NACL 20-5-0.45 MEQ/L-%-% IV SOLN
INTRAVENOUS | Status: DC
  Administered 2012-04-01 – 2012-04-03 (×4): via INTRAVENOUS
  Filled 2012-04-01 (×7): qty 1000

## 2012-04-01 MED ORDER — FENTANYL CITRATE 0.05 MG/ML IJ SOLN
50.0000 ug | Freq: Once | INTRAMUSCULAR | Status: AC
  Administered 2012-04-01: 50 ug via INTRAVENOUS

## 2012-04-01 MED ORDER — ACETAMINOPHEN 325 MG PO TABS
650.0000 mg | ORAL_TABLET | Freq: Four times a day (QID) | ORAL | Status: DC | PRN
  Administered 2012-04-04 – 2012-04-06 (×5): 650 mg via ORAL
  Filled 2012-04-01 (×5): qty 2

## 2012-04-01 MED ORDER — FENTANYL CITRATE 0.05 MG/ML IJ SOLN
INTRAMUSCULAR | Status: AC
  Filled 2012-04-01: qty 2

## 2012-04-01 MED ORDER — VERAPAMIL HCL ER 360 MG PO CP24: 360.0000 mg | ORAL_CAPSULE | Freq: Every day | ORAL | Status: DC

## 2012-04-01 NOTE — ED Notes (Signed)
Patient states she started having abdominal pain since midnight. States had similar symptoms in Feb, 2013 and had 24" of colon removed. Patient states abdominal pain radiates into her back and feels like she needs to burp.

## 2012-04-01 NOTE — ED Provider Notes (Signed)
History     CSN: [id number]  Arrival date & time 04/01/12  0606   First MD Initiated Contact with Patient 04/01/12 0647     6:50 AM HPI Patient reports abdominal pain that began approximately 6 hours ago. Reports pain located in the epigastrium. States pain is similar to prior bowel obstruction. Reports symptoms associated with nausea, vomiting, retching. Reports normal bowel movements. Denies fever, hematochezia, hematemesis, urinary symptoms, vaginal discharge. Patient reports a significant history of a bowel obstruction in February 2013. States she had 2 feet of bowel removed by Dr. Corliss Skains.    Patient is a 78 y.o. female presenting with abdominal pain. The history is provided by the patient.  Abdominal Pain The primary symptoms of the illness include abdominal pain, nausea and vomiting. The primary symptoms of the illness do not include fever, shortness of breath, diarrhea, hematemesis, hematochezia, dysuria, vaginal discharge or vaginal bleeding. The current episode started 6 to 12 hours ago. The onset of the illness was gradual. The problem has been gradually worsening.  The abdominal pain began 6 to 12 hours ago. The pain came on gradually. The abdominal pain has been gradually worsening since its onset. The abdominal pain is located in the epigastric region. Pain radiation: entire abdomen. The abdominal pain is relieved by nothing.  The illness is associated with retching. The patient states that she believes she is currently not pregnant. The patient has not had a change in bowel habit. Risk factors for an acute abdominal problem include a history of abdominal surgery and being elderly. Symptoms associated with the illness do not include chills, constipation, urgency, hematuria, frequency or back pain.    Past Medical History  Diagnosis Date  . Back problem   . Hypertension   . PONV (postoperative nausea and vomiting)     Past Surgical History  Procedure Date  . Back surgery   .  Appendectomy   . Abdominal hysterectomy   . Tonsillectomy     age 48  . Colon surgery 01-08-12  . Rotator cuff repair 1995  . Laparotomy 01/08/2012    Procedure: EXPLORATORY LAPAROTOMY;  Surgeon: Wilmon Arms. Corliss Skains, MD;  Location: [facility];  Service: General;  Laterality: N/A;  . Bowel resection 01/08/2012    Procedure: SMALL BOWEL RESECTION;  Surgeon: Wilmon Arms. Corliss Skains, MD;  Location: [facility];  Service: General;  Laterality: N/A;    No family history on file.  History  Substance Use Topics  . Smoking status: Never Smoker   . Smokeless tobacco: Never Used  . Alcohol Use: No    OB History    Grav Para Term Preterm Abortions TAB SAB Ect Mult Living                  Review of Systems  Constitutional: Negative for fever and chills.  Respiratory: Negative for shortness of breath.   Cardiovascular: Negative for chest pain.  Gastrointestinal: Positive for nausea, vomiting and abdominal pain. Negative for diarrhea, constipation, hematochezia and hematemesis.  Genitourinary: Negative for dysuria, urgency, frequency, hematuria, flank pain, vaginal bleeding, vaginal discharge and vaginal pain.  Musculoskeletal: Negative for back pain.  All other systems reviewed and are negative.    Allergies  Penicillins and Sulfa antibiotics  Home Medications   Current Outpatient Rx  Name Route Sig Dispense Refill  . ACETAMINOPHEN 325 MG PO TABS Oral Take 1-2 tablets (325-650 mg total) by mouth every 6 (six) hours as needed.    . OCUVITE PO TABS Oral Take 1 tablet  by mouth daily.    Marland Kitchen CALCIUM CARBONATE-VITAMIN D 500-200 MG-UNIT PO TABS Oral Take 1 tablet by mouth daily.    . CELEBREX 200 MG PO CAPS      . FLORA-Q PO CAPS Oral Take 1 capsule by mouth daily. 30 capsule 0    Use this for the next month and until you are back ...  . GABAPENTIN 300 MG PO CAPS Oral Take 300 mg by mouth 3 (three) times daily.    Marland Kitchen LOSARTAN POTASSIUM 100 MG PO TABS Oral Take 100 mg by mouth daily.    . OMEGA-3-ACID ETHYL  ESTERS 1 G PO CAPS Oral Take 1 g by mouth daily.    Marland Kitchen POLYETHYL GLYCOL-PROPYL GLYCOL 0.4-0.3 % OP SOLN Both Eyes Place 1 drop into both eyes daily.    . PSYLLIUM 95 % PO PACK Oral Take 1 packet by mouth 2 (two) times daily. 56 each     Use this to stay regular, If your to regular you c ...  . TRAMADOL HCL 50 MG PO TABS Oral Take 50 mg by mouth every 6 (six) hours as needed. TAKE 1-2 TABLETS EVERY 6 HOURS AS NEEDED    . VERAPAMIL HCL ER 360 MG PO CP24 Oral Take 360 mg by mouth at bedtime.      BP 163/76  Pulse 86  Temp(Src) 98.3 F (36.8 C) (Oral)  Resp 18  Ht 5\' 3"  (1.6 m)  Wt 122 lb (55.339 kg)  BMI 21.61 kg/m2  SpO2 100%  Physical Exam  Vitals reviewed. Constitutional: She is oriented to person, place, and time. Vital signs are normal. She appears well-developed and well-nourished.  HENT:  Head: Normocephalic and atraumatic.  Eyes: Conjunctivae are normal. Pupils are equal, round, and reactive to light.  Neck: Normal range of motion. Neck supple.  Cardiovascular: Normal rate, regular rhythm and normal heart sounds.  Exam reveals no friction rub.   No murmur heard. Pulmonary/Chest: Effort normal and breath sounds normal. She has no wheezes. She has no rhonchi. She has no rales. She exhibits no tenderness.  Abdominal: Normal appearance. She exhibits no distension, no ascites and no mass. Bowel sounds are decreased. There is no hepatosplenomegaly. There is generalized tenderness. There is no rigidity, no rebound, no guarding, no tenderness at McBurney's point and negative Murphy's sign.  Musculoskeletal: Normal range of motion.  Neurological: She is alert and oriented to person, place, and time. Coordination normal.  Skin: Skin is warm and dry. No rash noted. No erythema. No pallor.    ED Course  Procedures   MDM   7:21 AM Patient has had improved pain with IV fentanyl and Zofran. Pending labs and CT imaging to rule out polyp structure. Patient has history of bowel obstruction  in February 2000 and 13th. Reports she had colectomy where they removed 2 feet of bowel.    9:45 AM Patient reports pain is still manageable at 3/10. Pending CT scan.  11:00 AM Spoke with Dr. Kennith Center, [place]. Reports patient has dilated loops of smalls bowel which is likely indicative for SBO with transition zone in terminal ileum. Discussed plan with Dr. Lynelle Doctor, who will place a call to [facility]. discuss results with patient and family. Have ordered NG tube. Patient currently does not request any more pain medication   Thomasene Lot, PA-C 04/01/12 1231   Admitted to  Dr. Dwain Sarna [facility]. Requesting more pain medication  Thomasene Lot, Cordelia Poche 04/01/12 1232

## 2012-04-01 NOTE — ED Notes (Signed)
Surgery md at bedside.

## 2012-04-01 NOTE — H&P (Signed)
Appears to have sbo likely adhesive, abdomen distended but nontender, wbc normal, vitals wnl, will plan attempt conservative treatment, family and she understand possibility of surgery

## 2012-04-01 NOTE — H&P (Signed)
Laurie Galloway is an 78 y.o. female.   Chief Complaint: abdominal pain  HPI: Laurie Galloway is well known to the general surgery service from a recent admit 01/07/12 through 01/14/12 in which she had an E LAP and small bowel resection for ischemic small bowel with closed loop SBO. She had about 2 ft of what was thought to be the proximal ileum resected. She had been doing well at home and had follow ups in the office with Dr. Corliss Skains x 2 and was released.   She reports that she was continuing to do well until last night when she developed fairly sudden onset of abdominal pain which was diffuse and associated with some nausea, but no vomiting. She did have a small formed BM last night and had a small BM following her arrival here early this am when her pain would not resolve.She has been medicated with Fentanyl and given Zofran prophylactically and continues to hav.e diffuse abd pain although it is not as severe. She is hemodynamically stable and afebrile. WBC is 8.7. Electrolytes are essentially normal.   CT scan of the abd showed SBO with suspected transition zone about 10cm proximal to the ileocecal valve. We are asked to see the patient for admission.   Past Medical History  Diagnosis Date  . Back problem   . Hypertension   . PONV (postoperative nausea and vomiting)     Past Surgical History  Procedure Date  . Back surgery   . Appendectomy   . Abdominal hysterectomy   . Tonsillectomy     age 18  . Colon surgery 01-08-12  . Rotator cuff repair 1995  . Laparotomy 01/08/2012    Procedure: EXPLORATORY LAPAROTOMY;  Surgeon: Wilmon Arms. Corliss Skains, MD;  Location: [facility];  Service: General;  Laterality: N/A;  . Bowel resection 01/08/2012    Procedure: SMALL BOWEL RESECTION;  Surgeon: Wilmon Arms. Corliss Skains, MD;  Location: [facility];  Service: General;  Laterality: N/A;    History reviewed. No pertinent family history. Social History:  reports that she has never smoked. She has never used smokeless tobacco. She reports  that she does not drink alcohol or use illicit drugs.  Allergies:  Allergies  Allergen Reactions  . Penicillins   . Sulfa Antibiotics      (Not in a hospital admission)  Results for orders placed during the hospital encounter of 04/01/12 (from the past 48 hour(s))  URINALYSIS, ROUTINE W REFLEX MICROSCOPIC     Status: Abnormal   Collection Time   04/01/12  6:42 AM      Component Value Range Comment   Color, Urine YELLOW  YELLOW     APPearance CLOUDY (*) CLEAR     Specific Gravity, Urine 1.023  1.005 - 1.030     pH 7.0  5.0 - 8.0     Glucose, UA NEGATIVE  NEGATIVE (mg/dL)    Hgb urine dipstick NEGATIVE  NEGATIVE     Bilirubin Urine NEGATIVE  NEGATIVE     Ketones, ur 40 (*) NEGATIVE (mg/dL)    Protein, ur NEGATIVE  NEGATIVE (mg/dL)    Urobilinogen, UA 0.2  0.0 - 1.0 (mg/dL)    Nitrite NEGATIVE  NEGATIVE     Leukocytes, UA NEGATIVE  NEGATIVE  MICROSCOPIC NOT DONE ON URINES WITH NEGATIVE PROTEIN, BLOOD, LEUKOCYTES, NITRITE, OR GLUCOSE <1000 mg/dL.  CBC     Status: Normal   Collection Time   04/01/12  6:45 AM      Component Value Range Comment  WBC 8.7  4.0 - 10.5 (K/uL)    RBC 4.55  3.87 - 5.11 (MIL/uL)    Hemoglobin 13.6  12.0 - 15.0 (g/dL)    HCT 11.9  14.7 - 82.9 (%)    MCV 88.6  78.0 - 100.0 (fL)    MCH 29.9  26.0 - 34.0 (pg)    MCHC 33.7  30.0 - 36.0 (g/dL)    RDW 56.2  13.0 - 86.5 (%)    Platelets 276  150 - 400 (K/uL)   DIFFERENTIAL     Status: Abnormal   Collection Time   04/01/12  6:45 AM      Component Value Range Comment   Neutrophils Relative 90 (*) 43 - 77 (%)    Neutro Abs 7.9 (*) 1.7 - 7.7 (K/uL)    Lymphocytes Relative 7 (*) 12 - 46 (%)    Lymphs Abs 0.6 (*) 0.7 - 4.0 (K/uL)    Monocytes Relative 3  3 - 12 (%)    Monocytes Absolute 0.2  0.1 - 1.0 (K/uL)    Eosinophils Relative 0  0 - 5 (%)    Eosinophils Absolute 0.0  0.0 - 0.7 (K/uL)    Basophils Relative 0  0 - 1 (%)    Basophils Absolute 0.0  0.0 - 0.1 (K/uL)   COMPREHENSIVE METABOLIC PANEL     Status:  Abnormal   Collection Time   04/01/12  6:45 AM      Component Value Range Comment   Sodium 134 (*) 135 - 145 (mEq/L)    Potassium 3.9  3.5 - 5.1 (mEq/L)    Chloride 98  96 - 112 (mEq/L)    CO2 25  19 - 32 (mEq/L)    Glucose, Bld 136 (*) 70 - 99 (mg/dL)    BUN 19  6 - 23 (mg/dL)    Creatinine, Ser 7.84  0.50 - 1.10 (mg/dL)    Calcium 9.1  8.4 - 10.5 (mg/dL)    Total Protein 7.2  6.0 - 8.3 (g/dL)    Albumin 4.5  3.5 - 5.2 (g/dL)    AST 33  0 - 37 (U/L)    ALT 26  0 - 35 (U/L)    Alkaline Phosphatase 85  39 - 117 (U/L)    Total Bilirubin 0.6  0.3 - 1.2 (mg/dL)    GFR calc non Af Amer 87 (*) >90 (mL/min)    GFR calc Af Amer >90  >90 (mL/min)   LIPASE, BLOOD     Status: Normal   Collection Time   04/01/12  6:45 AM      Component Value Range Comment   Lipase 21  11 - 59 (U/L)    Ct Abdomen Pelvis W Contrast  04/01/2012  *RADIOLOGY REPORT*  Clinical Data: Abdominal distention.  History of previous small bowel obstruction.  Abdominal pain.  CT ABDOMEN AND PELVIS WITH CONTRAST  Technique:  Multidetector CT imaging of the abdomen and pelvis was performed following the standard protocol during bolus administration of intravenous contrast.  Contrast: OMNIPAQUE IOHEXOL 300 MG/ML  SOLN 01/07/2012  Comparison: 01/07/2012  Findings:  There is some dependent atelectasis in the lung bases.  No focal abnormalities seen in the liver or spleen.  The stomach is distended with contrast material.  The duodenum is unremarkable. Pancreas is unremarkable.  Gallbladder is distended.  The adrenal glands are unremarkable.  No renal mass or hydronephrosis.  No abdominal aortic aneurysm.  There is a small amount of free fluid in  the abdomen, around the liver.  Imaging through the pelvis shows no pelvic sidewall lymphadenopathy.  Bladder is unremarkable.  Uterus is surgically absent.  No adnexal mass.  Small bowel anastomoses is seen in the anterior lower pelvis.  Ileal loops in the pelvis are dilated up to 3 cm in  diameter.  Mid and distal ileum shows small bowel feces sign suggesting obstruction.  There is a transition zone from the distended debris filled distal ileum to the collapsed terminal ileum which contains no fluid or debris.  Although no definite mechanical obstruction is seen at this level, these changes may be related to a mechanical obstruction or small bowel stricture.  There is some edema and fluid within the small bowel mesentery of this region.  Bone windows reveal no worrisome lytic or sclerotic osseous lesions. The patient is status post lower lumbar fusion. Compression deformity at L2 is unchanged.  IMPRESSION: Mildly distended distal ileum the central pelvis demonstrates a small bowel feces sign, consistent with dysmotility proximal to an area of obstruction.  There is some edema/fluid within the mesentery of this region.  There is an apparent transition zone about 10 cm proximal to the ileocecal valve which may be related to adhesions although post ischemic or fibrostenotic stricture would also be a consideration.  Small volume ascites.  I discussed these findings by telephone with Vesta Mixer, the PA caring for the patient, at 11:02 a.m. on 04/01/2012.  Original Report Authenticated By: ERIC A. MANSELL, M.D.    Review of Systems  Gastrointestinal: Positive for nausea and abdominal pain. Negative for heartburn, vomiting, diarrhea, constipation, blood in stool and melena.    Blood pressure 152/62, pulse 80, temperature 98 F (36.7 C), temperature source Oral, resp. rate 18, height 5\' 3"  (1.6 m), weight 55.339 kg (122 lb), SpO2 94.00%. Physical Exam  Constitutional: She is oriented to person, place, and time. She appears distressed.       Thin elderly female in mild to moderate distress  HENT:  Head: Normocephalic and atraumatic.  Eyes: EOM are normal. Pupils are equal, round, and reactive to light.  Neck: Neck supple. No tracheal deviation present. No thyromegaly present.  Cardiovascular:  Normal rate, regular rhythm, normal heart sounds and intact distal pulses.  Exam reveals no gallop and no friction rub.   No murmur heard. Respiratory: Effort normal and breath sounds normal. No respiratory distress. She has no wheezes. She exhibits no tenderness.  GI:       +BS, mildly distended, diffuse mild tenderness (she has been medicated with Fentanyl) soft overall. No guarding.  Well healed infra-umbilical incision.   Musculoskeletal: Normal range of motion.  Neurological: She is alert and oriented to person, place, and time.  Skin: Skin is warm and dry.  Psychiatric: She has a normal mood and affect. Her behavior is normal. Judgment and thought content normal.     Assessment/Plan  S/P E LAP with SBO for ischemia, proximal ileum Recurrent SBO- Admit to [facility], NGT, bowel rest and pain control  Will discuss with Dr Dwain Sarna.   Brittnae Aschenbrenner,PA-C Pager [phone] General Trauma Pager [phone]

## 2012-04-01 NOTE — ED Notes (Signed)
78 year old female with recent small bowel obstruction secondary to adhesions was taken to the operating room for small bowel resection after she developed body stools and an elevated white blood cell count. She is followed up with her surgeon in the office twice since then and has been released from his care. Over the last 12 hours she has developed a gradual onset of abdominal pain associated with nausea but no vomiting. She has not been passing gas but has had one small bowel movement on arrival. She states that her abdomen is slightly distended and she feels the same as she did with her prior bowel obstruction. There is no fevers, chills, cough or shortness of breath and no swelling or rashes.  According to the medical record her surgical procedure involves an exploratory laparotomy with removal of 2 feet of ischemic bowel with a primary anastomosis of the small bowel.  Physical exam:  Abdomen is mildly distended, mild tympanitic sounds in the mid abdomen, decreased bowel sounds. Mucous membranes are moist, heart is regular without tachycardia or murmurs, lungs are clear, no peripheral edema and no rashes.  Assessment:  Concern for recurrent small bowel obstruction, patient at this time does not appear to be in acute distress, lab work, CT scan, surgical consult pending CT scan.  Medical screening examination/treatment/procedure(s) were conducted as a shared visit with non-physician practitioner(s) and myself.  I personally evaluated the patient during the encounter   Vida Roller, MD 04/01/12 [phone]

## 2012-04-02 ENCOUNTER — Inpatient Hospital Stay (HOSPITAL_COMMUNITY): Payer: Medicare Other

## 2012-04-02 LAB — CBC
HCT: 39.2 % (ref 36.0–46.0)
Hemoglobin: 13.2 g/dL (ref 12.0–15.0)
MCH: 29.4 pg (ref 26.0–34.0)
MCHC: 33.7 g/dL (ref 30.0–36.0)
MCV: 87.3 fL (ref 78.0–100.0)
Platelets: 288 10*3/uL (ref 150–400)
RBC: 4.49 MIL/uL (ref 3.87–5.11)
RDW: 14.5 % (ref 11.5–15.5)
WBC: 10.6 10*3/uL — ABNORMAL HIGH (ref 4.0–10.5)

## 2012-04-02 LAB — BASIC METABOLIC PANEL
BUN: 12 mg/dL (ref 6–23)
CO2: 25 mEq/L (ref 19–32)
Calcium: 8.5 mg/dL (ref 8.4–10.5)
Chloride: 98 mEq/L (ref 96–112)
Creatinine, Ser: 0.52 mg/dL (ref 0.50–1.10)
GFR calc Af Amer: >90 mL/min (ref >90)
GFR calc non Af Amer: 88 mL/min — ABNORMAL LOW (ref >90)
Glucose, Bld: 155 mg/dL — ABNORMAL HIGH (ref 70–99)
Potassium: 3.8 mEq/L (ref 3.5–5.1)
Sodium: 130 mEq/L — ABNORMAL LOW (ref 135–145)

## 2012-04-02 NOTE — Progress Notes (Signed)
CARE MANAGEMENT NOTE 04/02/2012  Patient:  MANDOLIN, FALWELL   Account Number:  [id number]  Date Initiated:  04/02/2012  Documentation initiated by:  Navy Rothschild  Subjective/Objective Assessment:   pt with confirmed recurrent sbo     Action/Plan:   lives at home with children   Anticipated DC Date:  04/05/2012   Anticipated DC Plan:  HOME/SELF CARE  In-house referral  NA      DC Planning Services  NA      [facility] Choice  NA   Choice offered to / List presented to:  NA   DME arranged  NA      DME agency  NA     HH arranged  NA      HH agency  NA   Status of service:  In process, will continue to follow Medicare Important Message given?  NA - LOS <3 / Initial given by admissions (If response is "NO", the following Medicare IM given date fields will be blank) Date Medicare IM given:   Date Additional Medicare IM given:    Discharge Disposition:    Per UR Regulation:  Reviewed for med. necessity/level of care/duration of stay  If discussed at Long Length of Stay Meetings, dates discussed:    Comments:  [id number]/Allana Shrestha Earlene Plater, RN, BSN, CCM No discharge needs present at time of this review at the sdu/icu level. Case Management [phone]

## 2012-04-02 NOTE — Progress Notes (Signed)
Subjective: No flatus or bm, stomach feels softer, back hurts  Objective: Vital signs in last 24 hours: Temp:  [97.8 F (36.6 C)-99.2 F (37.3 C)] 98.3 F (36.8 C) (05/02 0612) Pulse Rate:  [86-96] 86  (05/02 0612) Resp:  [18] 18  (05/02 0612) BP: (148-175)/(62-84) 162/84 mmHg (05/02 0612) SpO2:  [92 %-95 %] 94 % (05/02 0612)    Intake/Output from previous day: 05/01 0701 - 05/02 0700 In: 986 [I.V.:986] Out: 1800 [Urine:1800] Intake/Output this shift:    General appearance: no distress Resp: clear to auscultation bilaterally Cardio: regular rate and rhythm GI: moderately distended, nontender, incision healed, no bs  Lab Results:   Basename 04/02/12 0431 04/01/12 0645  WBC 10.6* 8.7  HGB 13.2 13.6  HCT 39.2 40.3  PLT 288 276   BMET  Basename 04/02/12 0431 04/01/12 0645  NA 130* 134*  K 3.8 3.9  CL 98 98  CO2 25 25  GLUCOSE 155* 136*  BUN 12 19  CREATININE 0.52 0.54  CALCIUM 8.5 9.1   PT/INR No results found for this basename: LABPROT:2,INR:2 in the last 72 hours ABG No results found for this basename: PHART:2,PCO2:2,PO2:2,HCO3:2 in the last 72 hours  Studies/Results: Ct Abdomen Pelvis W Contrast  04/01/2012  *RADIOLOGY REPORT*  Clinical Data: Abdominal distention.  History of previous small bowel obstruction.  Abdominal pain.  CT ABDOMEN AND PELVIS WITH CONTRAST  Technique:  Multidetector CT imaging of the abdomen and pelvis was performed following the standard protocol during bolus administration of intravenous contrast.  Contrast: OMNIPAQUE IOHEXOL 300 MG/ML  SOLN 01/07/2012  Comparison: 01/07/2012  Findings:  There is some dependent atelectasis in the lung bases.  No focal abnormalities seen in the liver or spleen.  The stomach is distended with contrast material.  The duodenum is unremarkable. Pancreas is unremarkable.  Gallbladder is distended.  The adrenal glands are unremarkable.  No renal mass or hydronephrosis.  No abdominal aortic aneurysm.   There is a small amount of free fluid in the abdomen, around the liver.  Imaging through the pelvis shows no pelvic sidewall lymphadenopathy.  Bladder is unremarkable.  Uterus is surgically absent.  No adnexal mass.  Small bowel anastomoses is seen in the anterior lower pelvis.  Ileal loops in the pelvis are dilated up to 3 cm in diameter.  Mid and distal ileum shows small bowel feces sign suggesting obstruction.  There is a transition zone from the distended debris filled distal ileum to the collapsed terminal ileum which contains no fluid or debris.  Although no definite mechanical obstruction is seen at this level, these changes may be related to a mechanical obstruction or small bowel stricture.  There is some edema and fluid within the small bowel mesentery of this region.  Bone windows reveal no worrisome lytic or sclerotic osseous lesions. The patient is status post lower lumbar fusion. Compression deformity at L2 is unchanged.  IMPRESSION: Mildly distended distal ileum the central pelvis demonstrates a small bowel feces sign, consistent with dysmotility proximal to an area of obstruction.  There is some edema/fluid within the mesentery of this region.  There is an apparent transition zone about 10 cm proximal to the ileocecal valve which may be related to adhesions although post ischemic or fibrostenotic stricture would also be a consideration.  Small volume ascites.  I discussed these findings by telephone with Vesta Mixer, the PA caring for the patient, at 11:02 a.m. on 04/01/2012.  Original Report Authenticated By: ERIC A. MANSELL, M.D.   Dg  Abd Portable 2v  04/02/2012  *RADIOLOGY REPORT*  Clinical Data: Small bowel obstruction  PORTABLE ABDOMEN - 2 VIEW  Comparison: 04/01/2012  Findings: NG tube projects over the stomach.  Similar bowel gas pattern to the recent CT scan.  There are are mildly thickened / inflamed small bowel loops without significant dilatation. Ingested contrast opacifies small bowel  loops in the right lower quadrant however has yet to reach the colon.  Stool intermixed with tiny locules of gas noted within the cecum and ascending colon. Surgical hardware of the lower lumbar spine post fusion.  No acute osseous abnormality.  IMPRESSION: Contrast opacifies distal small bowel.  Recommend follow-up KUB to document passage into the colon.  Original Report Authenticated By: Waneta Martins, M.D.    Assessment/Plan: SBO  No real change since last night , contrast on film in distal small bowel.  No indication for surgery right now and will continue conservative therapy for now.  Will plan on continuing ng tube, check film in am, serial exams.    LOS: 1 day    [facility] 04/02/2012

## 2012-04-03 ENCOUNTER — Inpatient Hospital Stay (HOSPITAL_COMMUNITY): Payer: Medicare Other

## 2012-04-03 LAB — CBC
HCT: 41.3 % (ref 36.0–46.0)
Hemoglobin: 14.1 g/dL (ref 12.0–15.0)
MCH: 29.7 pg (ref 26.0–34.0)
MCHC: 34.1 g/dL (ref 30.0–36.0)
MCV: 87.1 fL (ref 78.0–100.0)
Platelets: 287 10*3/uL (ref 150–400)
RBC: 4.74 MIL/uL (ref 3.87–5.11)
RDW: 14.2 % (ref 11.5–15.5)
WBC: 12.5 10*3/uL — ABNORMAL HIGH (ref 4.0–10.5)

## 2012-04-03 LAB — BASIC METABOLIC PANEL
BUN: 7 mg/dL (ref 6–23)
CO2: 26 mEq/L (ref 19–32)
Calcium: 8.5 mg/dL (ref 8.4–10.5)
Chloride: 95 mEq/L — ABNORMAL LOW (ref 96–112)
Creatinine, Ser: 0.54 mg/dL (ref 0.50–1.10)
GFR calc Af Amer: >90 mL/min (ref >90)
GFR calc non Af Amer: 87 mL/min — ABNORMAL LOW (ref >90)
Glucose, Bld: 141 mg/dL — ABNORMAL HIGH (ref 70–99)
Potassium: 3.9 mEq/L (ref 3.5–5.1)
Sodium: 129 mEq/L — ABNORMAL LOW (ref 135–145)

## 2012-04-03 MED ORDER — POTASSIUM CHLORIDE IN NACL 20-0.9 MEQ/L-% IV SOLN
INTRAVENOUS | Status: DC
  Administered 2012-04-03 – 2012-04-04 (×2): via INTRAVENOUS
  Administered 2012-04-04: 75 mL/h via INTRAVENOUS
  Administered 2012-04-05 – 2012-04-06 (×2): via INTRAVENOUS
  Administered 2012-04-07: 75 mL via INTRAVENOUS
  Administered 2012-04-07: 15:00:00 via INTRAVENOUS
  Administered 2012-04-08: 1000 mL via INTRAVENOUS
  Filled 2012-04-03 (×12): qty 1000

## 2012-04-03 MED ORDER — LEVOFLOXACIN IN D5W 500 MG/100ML IV SOLN
500.0000 mg | Freq: Every day | INTRAVENOUS | Status: DC
  Administered 2012-04-03 – 2012-04-07 (×5): 500 mg via INTRAVENOUS
  Filled 2012-04-03 (×5): qty 100

## 2012-04-03 MED ORDER — BISACODYL 10 MG RE SUPP
10.0000 mg | Freq: Once | RECTAL | Status: AC
  Administered 2012-04-03: 10 mg via RECTAL
  Filled 2012-04-03: qty 1

## 2012-04-03 NOTE — Progress Notes (Signed)
Patient ID: Earney Mallet, female   DOB: October 16, 1930, 78 y.o.   MRN: [id number]    Subjective: She passed a small amount of flatus, but still feels nauseated at times and hiccupping a lot.  She has had very little NGT OP and the tube does appear to be functioning properly.   She had a rough night due to back pain because she is off her Celebrex.  She wonders if a suppository or enema might help and I told her we could try today.   Objective: Vital signs in last 24 hours: Temp:  [98.5 F (36.9 C)-98.8 F (37.1 C)] 98.5 F (36.9 C) (05/03 0614) Pulse Rate:  [76-77] 77  (05/03 0614) Resp:  [18] 18  (05/03 0614) BP: (160-180)/(80-82) 165/82 mmHg (05/03 0614) SpO2:  [94 %] 94 % (05/03 0614) Last BM Date: 04/01/12  Intake/Output from previous day: 05/02 0701 - 05/03 0700 In: 3125 [I.V.:3125] Out: 3000 [Urine:3000] Intake/Output this shift:    General appearance: no distress Resp: clear to auscultation bilaterally Cardio: regular rate and rhythm GI: less distended, nontender, incision healed, very little in the way of BS still  Lab Results:   Basename 04/03/12 0445 04/02/12 0431  WBC 12.5* 10.6*  HGB 14.1 13.2  HCT 41.3 39.2  PLT 287 288   BMET  Basename 04/03/12 0445 04/02/12 0431  NA 129* 130*  K 3.9 3.8  CL 95* 98  CO2 26 25  GLUCOSE 141* 155*  BUN 7 12  CREATININE 0.54 0.52  CALCIUM 8.5 8.5   PT/INR No results found for this basename: LABPROT:2,INR:2 in the last 72 hours ABG No results found for this basename: PHART:2,PCO2:2,PO2:2,HCO3:2 in the last 72 hours  Studies/Results: Ct Abdomen Pelvis W Contrast  04/01/2012  *RADIOLOGY REPORT*  Clinical Data: Abdominal distention.  History of previous small bowel obstruction.  Abdominal pain.  CT ABDOMEN AND PELVIS WITH CONTRAST  Technique:  Multidetector CT imaging of the abdomen and pelvis was performed following the standard protocol during bolus administration of intravenous contrast.  Contrast: OMNIPAQUE IOHEXOL  300 MG/ML  SOLN 01/07/2012  Comparison: 01/07/2012  Findings:  There is some dependent atelectasis in the lung bases.  No focal abnormalities seen in the liver or spleen.  The stomach is distended with contrast material.  The duodenum is unremarkable. Pancreas is unremarkable.  Gallbladder is distended.  The adrenal glands are unremarkable.  No renal mass or hydronephrosis.  No abdominal aortic aneurysm.  There is a small amount of free fluid in the abdomen, around the liver.  Imaging through the pelvis shows no pelvic sidewall lymphadenopathy.  Bladder is unremarkable.  Uterus is surgically absent.  No adnexal mass.  Small bowel anastomoses is seen in the anterior lower pelvis.  Ileal loops in the pelvis are dilated up to 3 cm in diameter.  Mid and distal ileum shows small bowel feces sign suggesting obstruction.  There is a transition zone from the distended debris filled distal ileum to the collapsed terminal ileum which contains no fluid or debris.  Although no definite mechanical obstruction is seen at this level, these changes may be related to a mechanical obstruction or small bowel stricture.  There is some edema and fluid within the small bowel mesentery of this region.  Bone windows reveal no worrisome lytic or sclerotic osseous lesions. The patient is status post lower lumbar fusion. Compression deformity at L2 is unchanged.  IMPRESSION: Mildly distended distal ileum the central pelvis demonstrates a small bowel feces sign, consistent  with dysmotility proximal to an area of obstruction.  There is some edema/fluid within the mesentery of this region.  There is an apparent transition zone about 10 cm proximal to the ileocecal valve which may be related to adhesions although post ischemic or fibrostenotic stricture would also be a consideration.  Small volume ascites.  I discussed these findings by telephone with Vesta Mixer, the PA caring for the patient, at 11:02 a.m. on 04/01/2012.  Original Report  Authenticated By: ERIC A. MANSELL, M.D.   Dg Abd Portable 2v  04/03/2012  *RADIOLOGY REPORT*  Clinical Data: Small bowel obstruction.  PORTABLE ABDOMEN - 2 VIEW  Comparison: 04/02/2012.  Findings: Contrast remains within small bowel loops with proximal gas distended small bowel loops consistent with partial small bowel obstruction.  Small bowel folds appear slightly thickened.  No free intraperitoneal air.  Nasogastric tube in place.  IMPRESSION: Partial small bowel obstruction has progressed slightly since prior exam.  Original Report Authenticated By: Fuller Canada, M.D.   Dg Abd Portable 2v  04/02/2012  *RADIOLOGY REPORT*  Clinical Data: Small bowel obstruction  PORTABLE ABDOMEN - 2 VIEW  Comparison: 04/01/2012  Findings: NG tube projects over the stomach.  Similar bowel gas pattern to the recent CT scan.  There are are mildly thickened / inflamed small bowel loops without significant dilatation. Ingested contrast opacifies small bowel loops in the right lower quadrant however has yet to reach the colon.  Stool intermixed with tiny locules of gas noted within the cecum and ascending colon. Surgical hardware of the lower lumbar spine post fusion.  No acute osseous abnormality.  IMPRESSION: Contrast opacifies distal small bowel.  Recommend follow-up KUB to document passage into the colon.  Original Report Authenticated By: Waneta Martins, M.D.    Assessment/Plan: SBO- Seems to be improving, very little NGT OP, but SB still appears mildly more dilated.   Will also try Dulcolax supp this am to see if this will help some, as contrast does not appear to  Have moved much since yesterday.   Slight Leukocytosis- Re Ck UA, CXR and follow.  FEN- Slight lyte imbalance, high UOP , adjusted IVF VTE risk - Lovenox CV status- HD stable on usual medications DISPO- Continue NGT, try Dulc supp, monitor lytes, work up for leukocytosis     LOS: 2 days    Tayo Maute,PA-C [phone] -

## 2012-04-03 NOTE — Progress Notes (Signed)
Her films are a little better but contrast not moved, she has passed some flatus, abdomen much softer, will continue to manage conservatively for now as I think she is slowly improving.

## 2012-04-04 ENCOUNTER — Inpatient Hospital Stay (HOSPITAL_COMMUNITY): Payer: Medicare Other

## 2012-04-04 DIAGNOSIS — R109 Unspecified abdominal pain: Secondary | ICD-10-CM

## 2012-04-04 DIAGNOSIS — K56609 Unspecified intestinal obstruction, unspecified as to partial versus complete obstruction: Secondary | ICD-10-CM

## 2012-04-04 LAB — BASIC METABOLIC PANEL
BUN: 8 mg/dL (ref 6–23)
CO2: 26 mEq/L (ref 19–32)
Calcium: 8.5 mg/dL (ref 8.4–10.5)
Chloride: 97 mEq/L (ref 96–112)
Creatinine, Ser: 0.59 mg/dL (ref 0.50–1.10)
GFR calc Af Amer: >90 mL/min (ref >90)
GFR calc non Af Amer: 84 mL/min — ABNORMAL LOW (ref >90)
Glucose, Bld: 94 mg/dL (ref 70–99)
Potassium: 3.5 mEq/L (ref 3.5–5.1)
Sodium: 132 mEq/L — ABNORMAL LOW (ref 135–145)

## 2012-04-04 LAB — CBC
HCT: 40.2 % (ref 36.0–46.0)
Hemoglobin: 13.8 g/dL (ref 12.0–15.0)
MCH: 29.8 pg (ref 26.0–34.0)
MCHC: 34.3 g/dL (ref 30.0–36.0)
MCV: 86.8 fL (ref 78.0–100.0)
Platelets: 233 10*3/uL (ref 150–400)
RBC: 4.63 MIL/uL (ref 3.87–5.11)
RDW: 14 % (ref 11.5–15.5)
WBC: 10.5 10*3/uL (ref 4.0–10.5)

## 2012-04-04 NOTE — Progress Notes (Signed)
Subjective: Having small BMs and passing some gas.  Objective: Vital signs in last 24 hours: Temp:  [97.8 F (36.6 C)-98.2 F (36.8 C)] 97.8 F (36.6 C) (05/04 0608) Pulse Rate:  [77-92] 77  (05/04 0608) Resp:  [18-20] 18  (05/04 0608) BP: (164-180)/(80-97) 164/80 mmHg (05/04 0608) SpO2:  [91 %-94 %] 94 % (05/04 0608) Last BM Date: 04/03/12  Intake/Output from previous day: 05/03 0701 - 05/04 0700 In: 900 [I.V.:900] Out: 1601 [Urine:1600; Stool:1] Intake/Output this shift: Total I/O In: -  Out: 300 [Urine:300]  PE: Abd-soft, flat, nontender, + BS  Lab Results:   Basename 04/04/12 0425 04/03/12 0445  WBC 10.5 12.5*  HGB 13.8 14.1  HCT 40.2 41.3  PLT 233 287   BMET  Basename 04/04/12 0425 04/03/12 0445  NA 132* 129*  K 3.5 3.9  CL 97 95*  CO2 26 26  GLUCOSE 94 141*  BUN 8 7  CREATININE 0.59 0.54  CALCIUM 8.5 8.5   PT/INR No results found for this basename: LABPROT:2,INR:2 in the last 72 hours Comprehensive Metabolic Panel:    Component Value Date/Time   NA 132* 04/04/2012 0425   K 3.5 04/04/2012 0425   CL 97 04/04/2012 0425   CO2 26 04/04/2012 0425   BUN 8 04/04/2012 0425   CREATININE 0.59 04/04/2012 0425   GLUCOSE 94 04/04/2012 0425   CALCIUM 8.5 04/04/2012 0425   AST 33 04/01/2012 0645   ALT 26 04/01/2012 0645   ALKPHOS 85 04/01/2012 0645   BILITOT 0.6 04/01/2012 0645   PROT 7.2 04/01/2012 0645   ALBUMIN 4.5 04/01/2012 0645     Studies/Results: Dg Chest Port 1 View  04/04/2012  *RADIOLOGY REPORT*  Clinical Data: Evaluate right lung infiltrate  PORTABLE CHEST - 1 VIEW  Comparison: 04/03/2012; 01/10/2012; 01/08/2012; 03/12/2010  Findings: Grossly unchanged cardiac silhouette and mediastinal contours with atherosclerotic calcification within the aortic arch. Unchanged positioning of enteric tube, coiled in the gastric fundus.  Minimal improved aeration of the right mid lung with persistent linear heterogeneous opacities.  No new focal airspace opacities. There is persistent  blunting of the left costophrenic angle suggestive of a small left-sided effusion.  No definite right- sided effusion.  Persistent elevation of the right hemidiaphragm. Grossly unchanged bones.  IMPRESSION:  1.  Improved aeration of the left right mid lung with persistent linear perihilar heterogeneous opacities and mild elevation of the right hemidiaphragm, favored to represent subsegmental atelectasis. 2.  Grossly unchanged small left-sided effusion left base opacities, possibly atelectasis.  Original Report Authenticated By: Waynard Reeds, M.D.   Dg Chest Port 1 View  04/03/2012  *RADIOLOGY REPORT*  Clinical Data: Follow up atelectasis  PORTABLE CHEST - 1 VIEW  Comparison: 01/10/2012  Findings: No mediastinal silhouette is stable.  There is a NG tube coiled within stomach with tip in proximal stomach.  There is linear atelectasis or infiltrate in the right lower lobe perihilar region.  No pulmonary edema.  IMPRESSION: Linear atelectasis or early infiltrate in the right lower lobe infrahilar region.  No pulmonary edema.  NG tube coiled within stomach with tip in proximal stomach.  Original Report Authenticated By: Natasha Mead, M.D.   Dg Abd Portable 2v  04/04/2012  *RADIOLOGY REPORT*  Clinical Data: Evaluate small bowel obstruction  PORTABLE ABDOMEN - 2 VIEW  Comparison: 04/03/2012; 04/02/2012; CT abdomen pelvis - 04/01/2012  Findings: Interval transit of previously ingested enteric contrast, now seen within the cecum.  There is mild dilatation of the upstream small bowel  without definite evidence of obstruction.  No pneumoperitoneum, pneumatosis or portal venous gas.  An enteric tube overlies left upper abdominal quadrant.  Lower lumbar spine paraspinal fusion.  Lumbar spine degenerative change.  IMPRESSION: Interval passage of ingested enteric contrast into the cecum. No definite evidence of obstruction.  Original Report Authenticated By: Waynard Reeds, M.D.   Dg Abd Portable 2v  04/03/2012  *RADIOLOGY  REPORT*  Clinical Data: Small bowel obstruction.  PORTABLE ABDOMEN - 2 VIEW  Comparison: 04/02/2012.  Findings: Contrast remains within small bowel loops with proximal gas distended small bowel loops consistent with partial small bowel obstruction.  Small bowel folds appear slightly thickened.  No free intraperitoneal air.  Nasogastric tube in place.  IMPRESSION: Partial small bowel obstruction has progressed slightly since prior exam.  Original Report Authenticated By: Fuller Canada, M.D.    Anti-infectives: Anti-infectives     Start     Dose/Rate Route Frequency Ordered Stop   04/03/12 1200   levofloxacin (LEVAQUIN) IVPB 500 mg        500 mg 100 mL/hr over 60 Minutes Intravenous Daily-1800 04/03/12 1051            Assessment Active Problems:  Small bowel obstruction, partial-improving  ?PNA-On Levaquin    LOS: 3 days   Plan: D/C ng tube.  Keep NPO.  Repeat x-rays 5/5.   Jann Milkovich J 04/04/2012

## 2012-04-04 NOTE — ED Provider Notes (Signed)
Medical screening examination/treatment/procedure(s) were conducted as a shared visit with non-physician practitioner(s) and myself.  I personally evaluated the patient during the encounter  Please see my separate respective documentation pertaining to this patient encounter   Vida Roller, MD 04/04/12 [phone]

## 2012-04-05 ENCOUNTER — Inpatient Hospital Stay (HOSPITAL_COMMUNITY): Payer: Medicare Other

## 2012-04-05 NOTE — Progress Notes (Signed)
Subjective: Passing more gas and bowels are moving.  No nausea or vomiting.  No crampy abdominal pain.  Arthritis pain in back.  Objective: Vital signs in last 24 hours: Temp:  [98.1 F (36.7 C)-98.4 F (36.9 C)] 98.2 F (36.8 C) (05/05 0605) Pulse Rate:  [64-109] 64  (05/05 0605) Resp:  [18] 18  (05/05 0605) BP: (152-168)/(64-89) 166/64 mmHg (05/05 0605) SpO2:  [95 %-96 %] 95 % (05/05 0605) Last BM Date: 04/04/12  Intake/Output from previous day: 05/04 0701 - 05/05 0700 In: 1802.5 [I.V.:1802.5] Out: 1500 [Urine:1500] Intake/Output this shift:    PE: Abd-soft, flat, nontender, nondistended, active bowel sounds.  Lab Results:   Basename 04/04/12 0425 04/03/12 0445  WBC 10.5 12.5*  HGB 13.8 14.1  HCT 40.2 41.3  PLT 233 287   BMET  Basename 04/04/12 0425 04/03/12 0445  NA 132* 129*  K 3.5 3.9  CL 97 95*  CO2 26 26  GLUCOSE 94 141*  BUN 8 7  CREATININE 0.59 0.54  CALCIUM 8.5 8.5   PT/INR No results found for this basename: LABPROT:2,INR:2 in the last 72 hours Comprehensive Metabolic Panel:    Component Value Date/Time   NA 132* 04/04/2012 0425   K 3.5 04/04/2012 0425   CL 97 04/04/2012 0425   CO2 26 04/04/2012 0425   BUN 8 04/04/2012 0425   CREATININE 0.59 04/04/2012 0425   GLUCOSE 94 04/04/2012 0425   CALCIUM 8.5 04/04/2012 0425   AST 33 04/01/2012 0645   ALT 26 04/01/2012 0645   ALKPHOS 85 04/01/2012 0645   BILITOT 0.6 04/01/2012 0645   PROT 7.2 04/01/2012 0645   ALBUMIN 4.5 04/01/2012 0645     Studies/Results: Dg Chest Port 1 View  04/04/2012  *RADIOLOGY REPORT*  Clinical Data: Evaluate right lung infiltrate  PORTABLE CHEST - 1 VIEW  Comparison: 04/03/2012; 01/10/2012; 01/08/2012; 03/12/2010  Findings: Grossly unchanged cardiac silhouette and mediastinal contours with atherosclerotic calcification within the aortic arch. Unchanged positioning of enteric tube, coiled in the gastric fundus.  Minimal improved aeration of the right mid lung with persistent linear heterogeneous  opacities.  No new focal airspace opacities. There is persistent blunting of the left costophrenic angle suggestive of a small left-sided effusion.  No definite right- sided effusion.  Persistent elevation of the right hemidiaphragm. Grossly unchanged bones.  IMPRESSION:  1.  Improved aeration of the left right mid lung with persistent linear perihilar heterogeneous opacities and mild elevation of the right hemidiaphragm, favored to represent subsegmental atelectasis. 2.  Grossly unchanged small left-sided effusion left base opacities, possibly atelectasis.  Original Report Authenticated By: Waynard Reeds, M.D.   Dg Abd 2 Views  04/05/2012  *RADIOLOGY REPORT*  Clinical Data: Abdominal pain and distention.  Follow up small bowel obstruction.  ABDOMEN - 2 VIEW 04/05/2012:  Comparison: Two-view abdomen x-rays yesterday dating back to 04/02/2012.  CT abdomen pelvis 04/01/2012.  Findings: Interval nasogastric tube removal.  Recurrent gaseous distention of adjacent loops of jejunum in the left upper quadrant. Remainder of the small bowel of normal caliber.  Oral contrast material from the earlier CT within decompressed colon.  No free intraperitoneal air on the erect image.  Scattered air-fluid levels in the dilated jejunum.  IMPRESSION: Interval slight worsening of the partial small bowel obstruction after nasogastric tube removal.  No free intraperitoneal air.  Original Report Authenticated By: Arnell Sieving, M.D.   Dg Abd Portable 2v  04/04/2012  *RADIOLOGY REPORT*  Clinical Data: Evaluate small bowel obstruction  PORTABLE  ABDOMEN - 2 VIEW  Comparison: 04/03/2012; 04/02/2012; CT abdomen pelvis - 04/01/2012  Findings: Interval transit of previously ingested enteric contrast, now seen within the cecum.  There is mild dilatation of the upstream small bowel without definite evidence of obstruction.  No pneumoperitoneum, pneumatosis or portal venous gas.  An enteric tube overlies left upper abdominal quadrant.   Lower lumbar spine paraspinal fusion.  Lumbar spine degenerative change.  IMPRESSION: Interval passage of ingested enteric contrast into the cecum. No definite evidence of obstruction.  Original Report Authenticated By: Waynard Reeds, M.D.    Anti-infectives: Anti-infectives     Start     Dose/Rate Route Frequency Ordered Stop   04/03/12 1200   levofloxacin (LEVAQUIN) IVPB 500 mg        500 mg 100 mL/hr over 60 Minutes Intravenous Daily-1800 04/03/12 1051            Assessment Active Problems:  Small bowel obstruction, partial-Clinically improving; x-ray findings noted  ?PNA-On Levaquin    LOS: 4 days   Plan:  Tylenol for back pain.  Clear liquid diet.  If diet not tolerated and SBO pattern worsens, may need expl lap.   Lari Linson J 04/05/2012

## 2012-04-06 LAB — MAGNESIUM: Magnesium: 1.9 mg/dL (ref 1.5–2.5)

## 2012-04-06 MED ORDER — POLYVINYL ALCOHOL 1.4 % OP SOLN
1.0000 [drp] | Freq: Every day | OPHTHALMIC | Status: DC
  Administered 2012-04-06 – 2012-04-08 (×3): 1 [drp] via OPHTHALMIC
  Filled 2012-04-06: qty 15

## 2012-04-06 MED ORDER — VERAPAMIL HCL 80 MG PO TABS
80.0000 mg | ORAL_TABLET | Freq: Three times a day (TID) | ORAL | Status: DC
  Administered 2012-04-06 – 2012-04-08 (×7): 80 mg via ORAL
  Filled 2012-04-06 (×9): qty 1

## 2012-04-06 NOTE — Progress Notes (Signed)
  Subjective: Feels pretty good, allot of gas, small amount of stool, taking fiber since D/C.  Objective: Vital signs in last 24 hours: Temp:  [98.1 F (36.7 C)-98.7 F (37.1 C)] 98.1 F (36.7 C) (05/06 0548) Pulse Rate:  [66-68] 67  (05/06 0548) Resp:  [18] 18  (05/06 0548) BP: (135-159)/(49-85) 135/49 mmHg (05/06 0548) SpO2:  [97 %] 97 % (05/06 0548) Last BM Date: Apr 10, 2012  Afebrile, BP is variable, no labs,  Intake/Output from previous day: 11-Apr-2023 0701 - 05/06 0700 In: 1251.3 [P.O.:360; I.V.:891.3] Out: 2600 [Urine:2600] Intake/Output this shift:    General appearance: alert, cooperative and no distress Resp: clear to auscultation bilaterally GI: soft, not distended, up in chair, +BS, +flatus, small stool this AM, "no much."  Lab Results:   [facility] 04/04/12 0425  WBC 10.5  HGB 13.8  HCT 40.2  PLT 233    BMET  Basename 04/04/12 0425  NA 132*  K 3.5  CL 97  CO2 26  GLUCOSE 94  BUN 8  CREATININE 0.59  CALCIUM 8.5   PT/INR No results found for this basename: LABPROT:2,INR:2 in the last 72 hours   Lab 04/01/12 0645  AST 33  ALT 26  ALKPHOS 85  BILITOT 0.6  PROT 7.2  ALBUMIN 4.5     Lipase     Component Value Date/Time   LIPASE 21 04/01/2012 0645     Studies/Results: Dg Abd 2 Views  04/10/2012  *RADIOLOGY REPORT*  Clinical Data: Abdominal pain and distention.  Follow up small bowel obstruction.  ABDOMEN - 2 VIEW 04-10-2012:  Comparison: Two-view abdomen x-rays yesterday dating back to 04/02/2012.  CT abdomen pelvis 04/01/2012.  Findings: Interval nasogastric tube removal.  Recurrent gaseous distention of adjacent loops of jejunum in the left upper quadrant. Remainder of the small bowel of normal caliber.  Oral contrast material from the earlier CT within decompressed colon.  No free intraperitoneal air on the erect image.  Scattered air-fluid levels in the dilated jejunum.  IMPRESSION: Interval slight worsening of the partial small bowel obstruction  after nasogastric tube removal.  No free intraperitoneal air.  Original Report Authenticated By: Arnell Sieving, M.D.    Medications:    . enoxaparin  40 mg Subcutaneous Q24H  . levofloxacin (LEVAQUIN) IV  500 mg Intravenous q1800  . losartan  100 mg Oral Daily  . pantoprazole (PROTONIX) IV  40 mg Intravenous QHS  . 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 Tusei, now with  Recurrent SBO. Hypertension Back problems  Lovenox - DVT She is having allot of gas this AM, also some small amount of stool, not recorded.  She was up allot yesterday.  Continue ambulation and clears for now.  Recheck labs in AM.  Restart some of her verapamil.       LOS: 5 days    Laurie Galloway 04/06/2012

## 2012-04-06 NOTE — Progress Notes (Signed)
UR complete 

## 2012-04-06 NOTE — Progress Notes (Signed)
I have seen and examined the patient and agree with the assessment and plans. Appears to be improving  Leauna Sharber A. Magnus Ivan  MD, FACS

## 2012-04-07 LAB — CBC
HCT: 35.9 % — ABNORMAL LOW (ref 36.0–46.0)
Hemoglobin: 12.3 g/dL (ref 12.0–15.0)
MCH: 29.9 pg (ref 26.0–34.0)
MCHC: 34.3 g/dL (ref 30.0–36.0)
MCV: 87.1 fL (ref 78.0–100.0)
Platelets: 232 10*3/uL (ref 150–400)
RBC: 4.12 MIL/uL (ref 3.87–5.11)
RDW: 14.1 % (ref 11.5–15.5)
WBC: 7.7 10*3/uL (ref 4.0–10.5)

## 2012-04-07 LAB — COMPREHENSIVE METABOLIC PANEL
ALT: 17 U/L (ref 0–35)
AST: 22 U/L (ref 0–37)
Albumin: 3.1 g/dL — ABNORMAL LOW (ref 3.5–5.2)
Alkaline Phosphatase: 55 U/L (ref 39–117)
BUN: 4 mg/dL — ABNORMAL LOW (ref 6–23)
CO2: 26 mEq/L (ref 19–32)
Calcium: 8.4 mg/dL (ref 8.4–10.5)
Chloride: 101 mEq/L (ref 96–112)
Creatinine, Ser: 0.59 mg/dL (ref 0.50–1.10)
GFR calc Af Amer: >90 mL/min (ref >90)
GFR calc non Af Amer: 84 mL/min — ABNORMAL LOW (ref >90)
Glucose, Bld: 96 mg/dL (ref 70–99)
Potassium: 3.7 mEq/L (ref 3.5–5.1)
Sodium: 135 mEq/L (ref 135–145)
Total Bilirubin: 0.6 mg/dL (ref 0.3–1.2)
Total Protein: 5.3 g/dL — ABNORMAL LOW (ref 6.0–8.3)

## 2012-04-07 MED ORDER — BISACODYL 10 MG RE SUPP: 10.0000 mg | Freq: Once | RECTAL | Status: DC

## 2012-04-07 MED ORDER — POLYETHYLENE GLYCOL 3350 17 G PO PACK
17.0000 g | PACK | Freq: Every day | ORAL | Status: DC
  Administered 2012-04-07 – 2012-04-08 (×2): 17 g via ORAL
  Filled 2012-04-07 (×2): qty 1

## 2012-04-07 NOTE — Progress Notes (Signed)
I have seen and examined the patient and agree with the assessment and plans.  Shakya Sebring A. Forest Redwine  MD, FACS  

## 2012-04-07 NOTE — Progress Notes (Signed)
  Subjective: Feels good, + flatus, no BM.  She thinks she's empty but feels the need to have BM  Objective: Vital signs in last 24 hours: Temp:  [97.4 F (36.3 C)-98.2 F (36.8 C)] 97.4 F (36.3 C) (05/07 0538) Pulse Rate:  [70-95] 70  (05/07 0538) Resp:  [18] 18  (05/07 0538) BP: (135-148)/(62-80) 145/62 mmHg (05/07 0538) SpO2:  [97 %-98 %] 98 % (05/07 0538) Last BM Date: 05/02/12  Afebrile, VSS, labs are all OK.  Intake/Output from previous day: 05/06 0701 - 05/07 0700 In: 1845 [P.O.:720; I.V.:1125] Out: 1800 [Urine:1800] Intake/Output this shift:    General appearance: alert, cooperative and no distress Resp: clear to auscultation bilaterally GI: soft, non-tender; bowel sounds normal; no masses,  no organomegaly  Lab Results:   Basename 04/07/12 0405  WBC 7.7  HGB 12.3  HCT 35.9*  PLT 232    BMET  Basename 04/07/12 0405  NA 135  K 3.7  CL 101  CO2 26  GLUCOSE 96  BUN 4*  CREATININE 0.59  CALCIUM 8.4   PT/INR No results found for this basename: LABPROT:2,INR:2 in the last 72 hours   Lab 04/07/12 0405 04/01/12 0645  AST 22 33  ALT 17 26  ALKPHOS 55 85  BILITOT 0.6 0.6  PROT 5.3* 7.2  ALBUMIN 3.1* 4.5     Lipase     Component Value Date/Time   LIPASE 21 04/01/2012 0645     Studies/Results: Dg Abd 2 Views  05-02-2012  *RADIOLOGY REPORT*  Clinical Data: Abdominal pain and distention.  Follow up small bowel obstruction.  ABDOMEN - 2 VIEW 02-May-2012:  Comparison: Two-view abdomen x-rays yesterday dating back to 04/02/2012.  CT abdomen pelvis 04/01/2012.  Findings: Interval nasogastric tube removal.  Recurrent gaseous distention of adjacent loops of jejunum in the left upper quadrant. Remainder of the small bowel of normal caliber.  Oral contrast material from the earlier CT within decompressed colon.  No free intraperitoneal air on the erect image.  Scattered air-fluid levels in the dilated jejunum.  IMPRESSION: Interval slight worsening of the  partial small bowel obstruction after nasogastric tube removal.  No free intraperitoneal air.  Original Report Authenticated By: Arnell Sieving, M.D.    Medications:    . enoxaparin  40 mg Subcutaneous Q24H  . levofloxacin (LEVAQUIN) IV  500 mg Intravenous q1800  . losartan  100 mg Oral Daily  . pantoprazole (PROTONIX) IV  40 mg Intravenous QHS  . polyvinyl alcohol  1 drop Both Eyes Daily  . verapamil  80 mg Oral TID  . 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 Tusei, now with Recurrent SBO.  Hypertension  Back problems     Plan:  Full liquids, dulcolax suppository, miralax.  She already walking well.    LOS: 6 days    Laurie Galloway 04/07/2012

## 2012-04-07 NOTE — Clinical Documentation Improvement (Signed)
Abnormal Labs Clarification  THIS DOCUMENT IS NOT A PERMANENT PART OF THE MEDICAL RECORD  TO RESPOND TO THE THIS QUERY, FOLLOW THE INSTRUCTIONS BELOW:  1. If needed, update documentation for the patient's encounter via the notes activity.  2. Access this query again and click edit on the [employer].  3. After updating, or not, click F2 to complete all highlighted (required) fields concerning your review. Select "additional documentation in the medical record" OR "no additional documentation provided".  4. Click Sign note button.  5. The deficiency will fall out of your InBasket *Please let us know if you are not able to complete this workflow by phone or e-mail (listed below).  Please update your documentation within the medical record to reflect your response to this query.                                                                                   04/07/12  Dear Lacretia Nicks. Jennings/CCS  Marton Redwood  In a better effort to capture your patient's severity of illness, reflect appropriate length of stay and utilization of resources, a review of the medical record has revealed the following indicators.    Based on your clinical judgment, please clarify and document in a progress note and/or discharge summary the clinical condition associated with the following supporting information:  In responding to this query please exercise your independent judgment.  The fact that a query is asked, does not imply that any particular answer is desired or expected.  Abnormal findings (laboratory, x-ray, pathologic, and other diagnostic results) are not coded and reported unless the physician indicates their clinical significance.   The medical record reflects the following clinical findings, please clarify the diagnostic and/or clinical significance:      Please clarify if possible the  underlying  condition for abnormal  labs  listed below. Thank you  -Hyponatremia   Other  Condition___________________               Cannot Clinically Determine_________   Lab Test:                                      Patient Values                                                     5/4              5/3               5/2           5/1 Sodium 135 - 145 mEq/L         132 (L)    129 (L)                130 (L)   134 (L)  Treatment: NS with KCL 20 mEq/L @75ml /hr  I & O      Reviewed:  no additional documentation provided  Thank You,  Andy Gauss RN  Clinical Documentation Specialist:  Pager [phone] E-mail [email or web address]  Health Information Management [place]

## 2012-04-07 NOTE — Progress Notes (Signed)
Spoke to [employer], [place] informed him patient had large soft formed stool, states will continues miralax but d/c dulcolax suppository.

## 2012-04-08 MED ORDER — POLYETHYLENE GLYCOL 3350 17 G PO PACK: 17.0000 g | PACK | Freq: Every day | ORAL | Status: AC

## 2012-04-08 NOTE — Care Management Note (Signed)
    Page 1 of 2   04/08/2012     11:14:38 AM   CARE MANAGEMENT NOTE 04/08/2012  Patient:  DWIGHT, BURDO A   Account Number:  [id number]  Date Initiated:  04/02/2012  Documentation initiated by:  DAVIS,RHONDA  Subjective/Objective Assessment:   pt with confirmed recurrent sbo     Action/Plan:   lives at home with children   Anticipated DC Date:  04/05/2012   Anticipated DC Plan:  HOME/SELF CARE  In-house referral  NA      DC Planning Services  NA      [facility] Choice  NA   Choice offered to / List presented to:  NA   DME arranged  NA      DME agency  NA     HH arranged  NA      HH agency  NA   Status of service:  Completed, signed off Medicare Important Message given?  NA - LOS <3 / Initial given by admissions (If response is "NO", the following Medicare IM given date fields will be blank) Date Medicare IM given:   Date Additional Medicare IM given:    Discharge Disposition:  HOME/SELF CARE  Per UR Regulation:  Reviewed for med. necessity/level of care/duration of stay  If discussed at Long Length of Stay Meetings, dates discussed:    Comments:  [id number]/Rhonda Earlene Plater, RN, BSN, CCM No discharge needs present at time of this review at the sdu/icu level. Case Management [id number]

## 2012-04-08 NOTE — Progress Notes (Signed)
Reviewed AVS with patient and daughter.  Instructed patient to start mirlax otc med.

## 2012-04-08 NOTE — Progress Notes (Signed)
  Subjective: She wants to go home.  Feels good, +BM yesterday.     Objective: Vital signs in last 24 hours: Temp:  [97.5 F (36.4 C)-98.4 F (36.9 C)] 98.2 F (36.8 C) (05/08 0610) Pulse Rate:  [65-88] 78  (05/08 0610) Resp:  [16-20] 18  (05/08 0610) BP: (135-167)/(71-81) 135/73 mmHg (05/08 0610) SpO2:  [98 %-100 %] 98 % (05/08 0610) Last BM Date: 04/05/12  Afebrile, VSS, labs OK 3 BM's recorded   Intake/Output from previous day: 05/07 0701 - 05/08 0700 In: 3580 [P.O.:1800; I.V.:1780] Out: 4550 [Urine:4550] Intake/Output this shift:    General appearance: alert, cooperative and no distress Resp: clear to auscultation bilaterally GI: soft, non-tender; bowel sounds normal; no masses,  no organomegaly  Lab Results:   Basename 04/07/12 0405  WBC 7.7  HGB 12.3  HCT 35.9*  PLT 232    BMET  Basename 04/07/12 0405  NA 135  K 3.7  CL 101  CO2 26  GLUCOSE 96  BUN 4*  CREATININE 0.59  CALCIUM 8.4   PT/INR No results found for this basename: LABPROT:2,INR:2 in the last 72 hours   Lab 04/07/12 0405  AST 22  ALT 17  ALKPHOS 55  BILITOT 0.6  PROT 5.3*  ALBUMIN 3.1*     Lipase     Component Value Date/Time   LIPASE 21 04/01/2012 0645     Studies/Results: No results found.  Medications:    . enoxaparin  40 mg Subcutaneous Q24H  . levofloxacin (LEVAQUIN) IV  500 mg Intravenous q1800  . losartan  100 mg Oral Daily  . pantoprazole (PROTONIX) IV  40 mg Intravenous QHS  . polyethylene glycol  17 g Oral Daily  . polyvinyl alcohol  1 drop Both Eyes Daily  . verapamil  80 mg Oral TID  . 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 Tusei, now with Recurrent SBO.  Hypertension  Back problems    Plan:  Home today.  Low fiber diet. Follow up as needed.   LOS: 7 days    Laurie Galloway 04/08/2012

## 2012-04-08 NOTE — Discharge Instructions (Signed)
Low Fiber and Residue Restricted Diet Follow this diet for next 1-2 weeks till you feel everything is back to normal. A low fiber diet restricts foods that contain carbohydrates that are not digested in the small intestine. A diet containing about 10 g of fiber is considered low fiber. The diet needs to be individualized to suit patient tolerances and preferences and to avoid unnecessary restrictions. Generally, the foods emphasized in a low fiber diet have no skins or seeds. They may have been processed to remove bran, germ, or husks. Cooking may not necessarily eliminate the fiber. Cooking may, in fact, enable a greater quantity of fiber to be consumed in a lesser volume. Legumes and nuts are also restricted. The term low residue has also been used to describe low fiber diets, although the two are not the same. Residue refers to any substance that adds to bowel (colonic) contents, such as sloughed cells and intestinal bacteria, in addition to fiber. Residue-containing foods, prunes and prune juice, milk, and connective tissue from meats may also need to be eliminated. It is important to eliminate these foods during sudden (acute) attacks of inflammatory bowel disease, when there is a partial obstruction due to another reason, or when minimal fecal output is desired. When these problems are gone, a more normal diet may be used. PURPOSE  Prevent blockage of a partially obstructed or narrowed gastrointestinal tract.   Reduce stool weight and volume.   Slow the movement of waste.  WHEN IS THIS DIET USED?  Acute phase of Crohn's disease, ulcerative colitis, regional enteritis, or diverticulitis.   Narrowing (stenosis) of intestinal or esophageal tubes (lumina).   Transitional diet following surgery, injury (trauma), or illness.  ADEQUACY This diet is nutritionally adequate based on individual food choices according to the Recommended Dietary Allowances of the [employer]. CHOOSING  FOODS Check labels, especially on foods from the starch list. Often, dietary fiber content is listed with the Nutrition Facts panel.  Breads and Starches  Allowed: White, [place], and pita breads, plain rolls, buns, or sweet rolls, doughnuts, waffles, pancakes, bagels. Plain muffins, sweet breads, biscuits, matzoth. Flour. Soda, saltine, or graham crackers. Pretzels, rusks, melba toast, zwieback. Cooked cereals: cornmeal, farina, cream cereals. Dry cereals: refined corn, wheat, rice, and oat cereals (check label). Potatoes prepared any way without skins, refined macaroni, spaghetti, noodles, refined rice.   Avoid: Bread, rolls, or crackers made with whole-wheat, multigrains, rye, bran seeds, nuts, or coconut. Corn tortillas, table-shells. Corn chips, tortilla chips. Cereals containing whole-grains, multigrains, bran, coconut, nuts, or raisins. Cooked or dry oatmeal. Coarse wheat cereals, granola. Cereals advertised as "high fiber." Potato skins. Whole-grain pasta, wild or brown rice. Popcorn.  Vegetables  Allowed:  Strained tomato and vegetable juices. Fresh: tender lettuce, cucumber, cabbage, spinach, bean sprouts. Cooked, canned: asparagus, bean sprouts, cut green or wax beans, cauliflower, pumpkin, beets, mushrooms, olives, spinach, yellow squash, tomato, tomato sauce (no seeds), zucchini (peeled), turnips. Canned sweet potatoes. Small amounts of celery, onion, radish, and green pepper may be used. Keep servings limited to  cup.   Avoid: Fresh, cooked, or canned: artichokes, baked beans, beet greens, broccoli, Brussels sprouts, French-style green beans, corn, kale, legumes, peas, sweet potatoes. Cooked: green or red cabbage, spinach. Avoid large servings of any vegetables.  Fruit  Allowed:  All fruit juices except prune juice. Cooked or canned: apricots applesauce, cantaloupe, cherries, grapefruit, grapes, kiwi, mandarin oranges, peaches, pears, fruit cocktail, pineapple, plums, watermelon. Fresh:  banana, grapes, cantaloupe, avocado, cherries, pineapple, grapefruit, kiwi, nectarines,  peaches, oranges, blueberries, plums. Keep servings limited to  cup or 1 piece.   Avoid: Fresh: apple with or without skin, apricots, mango, pears, raspberries, strawberries. Prune juice, stewed or dried prunes. Dried fruits, raisins, dates. Avoid large servings of all fresh fruits.  Meat and Meat Substitutes  Allowed:  Ground or well-cooked tender beef, ham, veal, lamb, pork, or poultry. Eggs, plain cheese. Fish, oysters, shrimp, lobster, other seafood. Liver, organ meats.   Avoid: Tough, fibrous meats with gristle. Peanut butter, smooth or chunky. Cheese with seeds, nuts, or other foods not allowed. Nuts, seeds, legumes, dried peas, beans, lentils.  Milk  Allowed:  All milk products except those not allowed. Milk and milk product consumption should be minimal when low residue is desired.   Avoid: Yogurt that contains nuts or seeds.  Soups and Combination Foods  Allowed:  Bouillon, broth, or cream soups made from allowed foods. Any strained soup. Casseroles or mixed dishes made with allowed foods.   Avoid: Soups made from vegetables that are not allowed or that contain other foods not allowed.  Desserts and Sweets  Allowed:  Plain cakes and cookies, pie made with allowed fruit, pudding, custard, cream pie. Gelatin, fruit, ice, sherbet, frozen ice pops. Ice cream, ice milk without nuts. Plain hard candy, honey, jelly, molasses, syrup, sugar, chocolate syrup, gumdrops, marshmallows.   Avoid: Desserts, cookies, or candies that contain nuts, peanut butter, or dried fruits. Jams, preserves with seeds, marmalade.  Fats and Oils  Allowed:  Margarine, butter, cream, mayonnaise, salad oils, plain salad dressings made from allowed foods. Plain gravy, crisp bacon without rind.   Avoid: Seeds, nuts, olives. Avocados.  Beverages  Allowed:  All, except those listed to avoid.   Avoid: Fruit juices with high  pulp, prune juice.  Condiments  Allowed:  Ketchup, mustard, horseradish, vinegar, cream sauce, cheese sauce, cocoa powder. Spices in moderation: allspice, basil, bay leaves, celery powder or leaves, cinnamon, cumin powder, curry powder, ginger, mace, marjoram, onion or garlic powder, oregano, paprika, parsley flakes, ground pepper, rosemary, sage, savory, tarragon, thyme, turmeric.   Avoid: Coconut, pickles.  SAMPLE MEAL PLAN The following menu is provided as a sample. Your daily menu plans will vary. Be sure to include a minimum of the following each day in order to provide essential nutrients for the adult:  Starch/Bread/Cereal Group, 6 servings.   Fruit/Vegetable Group, 5 servings.   Meat/Meat Substitute Group, 2 servings.   Milk/Milk Substitute Group, 2 servings.  A serving is equal to  cup for fruits, vegetables, and cooked cereals or 1 piece for foods such as a piece of bread, 1 orange, or 1 apple. For dry cereals and crackers, use serving sizes listed on the label. Combination foods may count as full or partial servings from various food groups. Fats, desserts, and sweets may be added to the meal plan after the requirements for essential nutrients are met. SAMPLE MENU Breakfast   cup orange juice.   1 boiled egg.   1 slice white toast.   Margarine.    cup cornflakes.   1 cup milk.   Beverage.  Lunch   cup chicken noodle soup.   2 to 3 oz sliced roast beef.   2 slices seedless rye bread.   Mayonnaise.    cup tomato juice.   1 small banana.   Beverage.  Dinner  3 oz baked chicken.    cup scalloped potatoes.    cup cooked beets.   White dinner roll.   Margarine.  cup canned peaches. Intestinal Obstruction An intestinal obstruction comes from a physical blockage in the bowel. Different problems in the bowel may cause these blockages.  CAUSES  Adhesions from previous surgeries  Cancer or tumor  A hernia, which is a condition in which a  portion of the bowel bulges out through an opening or weakness in the abdomen (belly). This sometimes squeezes the bowel.  A swallowed object.  Blockage (impaction) with worms is common in third world countries.  A twisting of the bowel or telescoping of a portion of the bowel into another portion (intussusceptions)  Anything that stops food from going through from the stomach to the anus.  SYMPTOMS  Symptoms of bowel obstruction may result in your belly getting bigger (bloating), nausea, vomiting, explosive diarrhea, explosive stool. You may not be able to hear your normal bowel sounds (such as "growling in your stomach"). You may also stop having bowel movements or passing gas. DIAGNOSIS  Usually this condition is diagnosed with a history and an examination. Often, lab studies (blood work) and x-rays may be used to find the cause. TREATMENT  The main treatment of this condition is to rest the intestine (gut). Often times the obstruction may relieve itself and allow the gut to start working again. Think of the gut like a balloon that is blown up (filled with trapped food and water) which has squeezed into a hole or area which it cannot get through.  If the obstruction is complete, an NG tube (nasogastric) is passed through the nose and into the stomach. It is then connected to suction to keep the stomach emptied out. This also helps treat the nausea and vomiting.  If there is an imbalance in the electrolytes, they are corrected with intravenous fluids. These have the proper chemicals in them to correct the problem.  If the reason for the obstruction (blockage) does not get better with conservative (non-surgical) treatment, surgery may be necessary. Sometimes, surgery is done immediately if your surgeon knows that the problem is not going to get better with conservative treatment.  PROGNOSIS  Depending on what the problem is, most of these problems can be treated by your caregivers with good results.  Your surgeon will discuss the best course of action to take, with you or your loved ones. FOLLOWING SURGERY Seek immediate medical attention if you have: Increasing abdominal pain, repeated vomiting, dehydration or fainting.  Severe weakness, chest pain or back pain.  Blood in your vomit, stool or you have tarry stool.  Document Released: 02/08/2004 Document Revised: 11/07/2011 Document Reviewed: 07/08/2008  [facility] Patient Information 2012 [place], [place].  Beverage.  Document Released: 05/10/2002 Document Revised: 11/07/2011 Document Reviewed: 10/21/2011 [facility] Patient Information 2012 [place], [place].

## 2012-04-08 NOTE — Progress Notes (Signed)
Physician Discharge Summary  Patient ID: Laurie Galloway MRN: [id number] DOB/AGE: Oct 10, 1930 77 y.o.  Admit date: 04/01/2012 Discharge date: 04/08/2012  Admission Diagnoses: S/P E LAP with SBO for ischemia, proximal ileum  Recurrent SBO Patient Active Problem List  Diagnoses  . Small bowel obstruction  . HTN (hypertension)  . Tachycardia  . Gastrointestinal bleeding  . Chronic back pain  . Epigastric pain  . Small bowel obstruction, partial    Discharge Diagnoses: Same Active Problems:  Small bowel obstruction, partial   PROCEDURES: None  Hospital Course: A recent admit 01/07/12 through 01/14/12 in which she had an Exploratory LAP and small bowel resection for ischemic small bowel with closed loop SBO. She had about 2 ft of what was thought to be the proximal ileum resected. She had been doing well at home and had follow ups in the office with Dr. Corliss Skains x 2 and was released.  She reports that she was continuing to do well until last night when she developed fairly sudden onset of abdominal pain which was diffuse and associated with some nausea, but no vomiting. She did have a small formed BM last night and had a small BM following her arrival here early this am when her pain would not resolve.She has been medicated with Fentanyl and given Zofran prophylactically and continues to hav.e diffuse abd pain although it is not as severe. She is hemodynamically stable and afebrile. WBC is 8.7. Electrolytes are essentially normal.  She was admitted and placed on bowel rest, IV fluids, and NG drainage.  She slowly opened up and has been advanced to full liquids.  Yesterday she had 3 Bowel movements, tolerating diet and wants to go home now. We will plan D/C today on low fiber diet, continued mobilization, and follow up as needed.  Her primary care is Dr. Cam Hai and she can also follow up with her. Condition on D/C:  Improved  Disposition: 06-Home-Health Care Svc   Medication List  As of  04/08/2012  8:33 AM   STOP taking these medications         psyllium 95 % Pack         TAKE these medications         acetaminophen 325 MG tablet   Commonly known as: TYLENOL   Take 1-2 tablets (325-650 mg total) by mouth every 6 (six) hours as needed.      beta carotene w/minerals tablet   Take 1 tablet by mouth daily.      calcium-vitamin D 500-200 MG-UNIT per tablet   Commonly known as: OSCAL WITH D   Take 1 tablet by mouth daily.      CELEBREX 200 MG capsule   Generic drug: celecoxib   Take 200 mg by mouth daily.      gabapentin 300 MG capsule   Commonly known as: NEURONTIN   Take 300 mg by mouth 3 (three) times daily.      losartan 100 MG tablet   Commonly known as: COZAAR   Take 100 mg by mouth daily.      omega-3 acid ethyl esters 1 G capsule   Commonly known as: LOVAZA   Take 1 g by mouth daily.      polyethylene glycol packet   Commonly known as: MIRALAX / GLYCOLAX   Take 17 g by mouth daily.      SYSTANE 0.4-0.3 % Soln   Generic drug: Polyethyl Glycol-Propyl Glycol   Place 1 drop into both eyes daily.  traMADol 50 MG tablet   Commonly known as: ULTRAM   Take 50 mg by mouth every 6 (six) hours as needed. TAKE 1-2 TABLETS EVERY 6 HOURS AS NEEDED      verapamil 360 MG 24 hr capsule   Commonly known as: VERELAN PM   Take 360 mg by mouth at bedtime.           Follow-up Information    Call Wynona Luna., MD. (As needed)    Contact information:   [facility] [address] [place] [address] [phone]          Signed: Sherrie George 04/08/2012, 8:33 AM

## 2012-04-08 NOTE — Progress Notes (Signed)
I have seen and examined the patient and agree with the assessment and plans.  Lada Fulbright A. Corryn Madewell  MD, FACS  

## 2012-04-08 NOTE — Progress Notes (Signed)
I have seen and examined the patient and agree with the assessment and plans.  Cylee Dattilo A. Aara Jacquot  MD, FACS  

## 2012-04-11 ENCOUNTER — Emergency Department (HOSPITAL_COMMUNITY)
Admission: EM | Admit: 2012-04-11 | Discharge: 2012-04-11 | Disposition: A | Discharge status: Home / Self Care | Payer: Medicare Other | Attending: Emergency Medicine | Admitting: Emergency Medicine

## 2012-04-11 ENCOUNTER — Emergency Department (HOSPITAL_COMMUNITY): Payer: Medicare Other

## 2012-04-11 ENCOUNTER — Encounter (HOSPITAL_COMMUNITY): Payer: Self-pay | Admitting: *Deleted

## 2012-04-11 DIAGNOSIS — R112 Nausea with vomiting, unspecified: Secondary | ICD-10-CM | POA: Insufficient documentation

## 2012-04-11 DIAGNOSIS — I10 Essential (primary) hypertension: Secondary | ICD-10-CM | POA: Insufficient documentation

## 2012-04-11 DIAGNOSIS — R1013 Epigastric pain: Secondary | ICD-10-CM | POA: Insufficient documentation

## 2012-04-11 LAB — CBC
HCT: 41.3 % (ref 36.0–46.0)
Hemoglobin: 14 g/dL (ref 12.0–15.0)
MCH: 29.9 pg (ref 26.0–34.0)
MCHC: 33.9 g/dL (ref 30.0–36.0)
MCV: 88.1 fL (ref 78.0–100.0)
Platelets: 298 10*3/uL (ref 150–400)
RBC: 4.69 MIL/uL (ref 3.87–5.11)
RDW: 14.8 % (ref 11.5–15.5)
WBC: 7.7 10*3/uL (ref 4.0–10.5)

## 2012-04-11 LAB — COMPREHENSIVE METABOLIC PANEL
ALT: 19 U/L (ref 0–35)
AST: 28 U/L (ref 0–37)
Albumin: 3.9 g/dL (ref 3.5–5.2)
Alkaline Phosphatase: 65 U/L (ref 39–117)
BUN: 17 mg/dL (ref 6–23)
CO2: 26 mEq/L (ref 19–32)
Calcium: 9.5 mg/dL (ref 8.4–10.5)
Chloride: 97 mEq/L (ref 96–112)
Creatinine, Ser: 0.73 mg/dL (ref 0.50–1.10)
GFR calc Af Amer: >90 mL/min (ref >90)
GFR calc non Af Amer: 79 mL/min — ABNORMAL LOW (ref >90)
Glucose, Bld: 113 mg/dL — ABNORMAL HIGH (ref 70–99)
Potassium: 3.9 mEq/L (ref 3.5–5.1)
Sodium: 135 mEq/L (ref 135–145)
Total Bilirubin: 0.7 mg/dL (ref 0.3–1.2)
Total Protein: 6.6 g/dL (ref 6.0–8.3)

## 2012-04-11 MED ORDER — ONDANSETRON HCL 8 MG PO TABS: 8.0000 mg | ORAL_TABLET | Freq: Three times a day (TID) | ORAL | Status: AC | PRN

## 2012-04-11 NOTE — Discharge Instructions (Signed)
Drink plenty of fluids - advance diet as tolerated. Take zofran as need for nausea.  Follow up with your doctor in the next couple days for recheck if symptoms fail to improve/resolve.  Return to ER if worse, severe abdominal pain, persistent vomiting, fevers, other concern.     Nausea and Vomiting Nausea is a sick feeling that often comes before throwing up (vomiting). Vomiting is a reflex where stomach contents come out of your mouth. Vomiting can cause severe loss of body fluids (dehydration). Children and elderly adults can become dehydrated quickly, especially if they also have diarrhea. Nausea and vomiting are symptoms of a condition or disease. It is important to find the cause of your symptoms. CAUSES   Direct irritation of the stomach lining. This irritation can result from increased acid production (gastroesophageal reflux disease), infection, food poisoning, taking certain medicines (such as nonsteroidal anti-inflammatory drugs), alcohol use, or tobacco use.   Signals from the brain.These signals could be caused by a headache, heat exposure, an inner ear disturbance, increased pressure in the brain from injury, infection, a tumor, or a concussion, pain, emotional stimulus, or metabolic problems.   An obstruction in the gastrointestinal tract (bowel obstruction).   Illnesses such as diabetes, hepatitis, gallbladder problems, appendicitis, kidney problems, cancer, sepsis, atypical symptoms of a heart attack, or eating disorders.   Medical treatments such as chemotherapy and radiation.   Receiving medicine that makes you sleep (general anesthetic) during surgery.  DIAGNOSIS Your caregiver may ask for tests to be done if the problems do not improve after a few days. Tests may also be done if symptoms are severe or if the reason for the nausea and vomiting is not clear. Tests may include:  Urine tests.   Blood tests.   Stool tests.   Cultures (to look for evidence of  infection).   X-rays or other imaging studies.  Test results can help your caregiver make decisions about treatment or the need for additional tests. TREATMENT You need to stay well hydrated. Drink frequently but in small amounts.You may wish to drink water, sports drinks, clear broth, or eat frozen ice pops or gelatin dessert to help stay hydrated.When you eat, eating slowly may help prevent nausea.There are also some antinausea medicines that may help prevent nausea. HOME CARE INSTRUCTIONS   Take all medicine as directed by your caregiver.   If you do not have an appetite, do not force yourself to eat. However, you must continue to drink fluids.   If you have an appetite, eat a normal diet unless your caregiver tells you differently.   Eat a variety of complex carbohydrates (rice, wheat, potatoes, bread), lean meats, yogurt, fruits, and vegetables.   Avoid high-fat foods because they are more difficult to digest.   Drink enough water and fluids to keep your urine clear or pale yellow.   If you are dehydrated, ask your caregiver for specific rehydration instructions. Signs of dehydration may include:   Severe thirst.   Dry lips and mouth.   Dizziness.   Dark urine.   Decreasing urine frequency and amount.   Confusion.   Rapid breathing or pulse.  SEEK IMMEDIATE MEDICAL CARE IF:   You have blood or brown flecks (like coffee grounds) in your vomit.   You have black or bloody stools.   You have a severe headache or stiff neck.   You are confused.   You have severe abdominal pain.   You have chest pain or trouble breathing.  You do not urinate at least once every 8 hours.   You develop cold or clammy skin.   You continue to vomit for longer than 24 to 48 hours.   You have a fever.  MAKE SURE YOU:   Understand these instructions.   Will watch your condition.   Will get help right away if you are not doing well or get worse.  Document Released: 11/18/2005  Document Revised: 11/07/2011 Document Reviewed: 04/17/2011 [facility] Patient Information 2012 Laurie Galloway, [place].

## 2012-04-11 NOTE — ED Notes (Signed)
Dr Johna Sheriff called stating pt was just discharged with bowel obstruction, pt returns due to vomiting. Dr Johna Sheriff will come see pt and has given CN orders.

## 2012-04-11 NOTE — ED Notes (Signed)
  Subjective: Patient was just discharged 3 days ago following an approximately 1 week hospitalization for recurrent small bowel obstruction. She has a history of closed-loop obstruction and small bowel resection in February of this year. During his recent hospitalization she resolved nonoperatively and felt well at discharge. She was tolerating solid food for about a day and a half and then yesterday developed nausea and frequent copious emesis that was brown and foul-smelling. This morning she had another episode of vomiting and her daughter called I asked him to come to the emergency room for evaluation.  The patient states that currently she has no nausea. At no point in the last couple of days has she had any abdominal distention or cramping or pain or pressure as she has had with her previous bowel obstructions. She had a small bowel movement this morning.  Objective: Vital signs in last 24 hours: Temp:  [97.9 F (36.6 C)] 97.9 F (36.6 C) (05/11 1013) Pulse Rate:  [79] 79  (05/11 1013) Resp:  [16] 16  (05/11 1013) BP: (126)/(67) 126/67 mmHg (05/11 1013) SpO2:  [100 %] 100 % (05/11 1013)     General appearance: alert, cooperative and no distress GI: abdomen is nondistended. Well-healed incision without hernias. Bowel sounds are normoactive. Flat soft and nontender without guarding or masses or organomegaly.   Lab Results:   Basename 04/11/12 1050  WBC 7.7  HGB 14.0  HCT 41.3  PLT 298   BMET  Basename 04/11/12 1050  NA 135  K 3.9  CL 97  CO2 26  GLUCOSE 113*  BUN 17  CREATININE 0.73  CALCIUM 9.5     Studies/Results: Dg Abd Acute W/chest  04/11/2012  *RADIOLOGY REPORT*  Clinical Data: Epigastric pain and vomiting  ACUTE ABDOMEN SERIES (ABDOMEN 2 VIEW & CHEST 1 VIEW)  Comparison: 04/05/2012  Findings: Heart size is normal.  There is no pleural effusion or pulmonary edema  No airspace consolidation identified  The lungs appear hyperinflated and there are coarsened  interstitial markings.  No airspace consolidation.  The bowel gas pattern is nonspecific.  No dilated loops of small bowel or air-fluid levels.  Previously noted left upper quadrant dilated small bowel loops have improved from previous exam.  There are postoperative changes noted within the lumbar spine.  IMPRESSION:  1.  There are no specific features identified to suggest high-grade bowel obstruction. 2.  No active cardiopulmonary abnormalities.  Original Report Authenticated By: Rosealee Albee, M.D.    Anti-infectives: Anti-infectives    None      Assessment/Plan: Nausea and vomiting following a recent hospitalization for small bowel obstruction. Evaluation in the ER today shows no evidence of obstruction. She has none of the pain and pressure that she has had previous blockages. Her x-rays are unremarkable and her abdomen is completely soft and nontender. I discussed with the patient and her daughter that I cannot rule out an early recurrent obstruction but I have no evidence for this. This could be an food intolerance or even a viral syndrome.  We discussed options of readmission and observation versus discharge and close followup. She strongly desires to go home and I think this is reasonable given her completely negative workup. She will stay on clear liquid diet for at least 24 hours without nausea and then gradually advance her diet. They understand they will: She has recurrent vomiting.    LOS: 0 days    Eilleen Davoli T 04/11/2012

## 2012-04-11 NOTE — ED Provider Notes (Signed)
History     CSN: [id number]  Arrival date & time 04/11/12  1005   First MD Initiated Contact with Patient 04/11/12 1016      Chief Complaint  Patient presents with  . Abdominal Pain    (Consider location/radiation/quality/duration/timing/severity/associated sxs/prior treatment) Patient is a 78 y.o. female presenting with abdominal pain. The history is provided by the patient and a relative.  Abdominal Pain The primary symptoms of the illness include vomiting. The primary symptoms of the illness do not include fever or shortness of breath.  Symptoms associated with the illness do not include back pain.  pt with hx sbo, presents w recent epigastric/upper abd discomfort, and recurrent episodes emesis. States was discharged home 3 days ago after admission for sbo which improved w ivf, bowel rest. In past day, return of emesis, dark colored, brownish. States had several episodes since yesterday. No bloody emesis. Is continuing to have small bms, including this morning. No abdominal distension. Currently no abd pain. No back or flank pain. No fever or chills. No known bad food ingestion or ill contacts w nvd type illness. No gu c/o, states urinating of normal frequency.   Past Medical History  Diagnosis Date  . Back problem   . Hypertension   . PONV (postoperative nausea and vomiting)     Past Surgical History  Procedure Date  . Back surgery   . Appendectomy   . Abdominal hysterectomy   . Tonsillectomy     age 41  . Colon surgery 01-08-12  . Rotator cuff repair 1995  . Laparotomy 01/08/2012    Procedure: EXPLORATORY LAPAROTOMY;  Surgeon: Wilmon Arms. Corliss Skains, MD;  Location: [facility];  Service: General;  Laterality: N/A;  . Bowel resection 01/08/2012    Procedure: SMALL BOWEL RESECTION;  Surgeon: Wilmon Arms. Corliss Skains, MD;  Location: [facility];  Service: General;  Laterality: N/A;    History reviewed. No pertinent family history.  History  Substance Use Topics  . Smoking status: Never Smoker   .  Smokeless tobacco: Never Used  . Alcohol Use: No    OB History    Grav Para Term Preterm Abortions TAB SAB Ect Mult Living                  Review of Systems  Constitutional: Negative for fever.  HENT: Negative for sore throat.   Eyes: Negative for redness.  Respiratory: Negative for cough and shortness of breath.   Cardiovascular: Negative for chest pain.  Gastrointestinal: Positive for vomiting.  Genitourinary: Negative for flank pain.  Musculoskeletal: Negative for back pain.  Skin: Negative for rash.  Neurological: Negative for headaches.  Psychiatric/Behavioral: The patient is not nervous/anxious.     Allergies  Penicillins and Sulfa antibiotics  Home Medications   Current Outpatient Rx  Name Route Sig Dispense Refill  . ACETAMINOPHEN 325 MG PO TABS Oral Take 1-2 tablets (325-650 mg total) by mouth every 6 (six) hours as needed.    . OCUVITE PO TABS Oral Take 1 tablet by mouth daily.    Marland Kitchen CALCIUM CARBONATE-VITAMIN D 500-200 MG-UNIT PO TABS Oral Take 1 tablet by mouth daily.    . CELEBREX 200 MG PO CAPS Oral Take 200 mg by mouth daily.     Marland Kitchen GABAPENTIN 300 MG PO CAPS Oral Take 300 mg by mouth 3 (three) times daily.    Marland Kitchen LOSARTAN POTASSIUM 100 MG PO TABS Oral Take 100 mg by mouth daily.    . OMEGA-3-ACID ETHYL ESTERS 1 G PO  CAPS Oral Take 1 g by mouth daily.    Marland Kitchen POLYETHYL GLYCOL-PROPYL GLYCOL 0.4-0.3 % OP SOLN Both Eyes Place 1 drop into both eyes daily.    Marland Kitchen POLYETHYLENE GLYCOL 3350 PO PACK Oral Take 17 g by mouth daily. 14 each     You want to have 1-2 soft bowel movements daily.   ...  . TRAMADOL HCL 50 MG PO TABS Oral Take 50 mg by mouth every 6 (six) hours as needed. TAKE 1-2 TABLETS EVERY 6 HOURS AS NEEDED    . VERAPAMIL HCL ER 360 MG PO CP24 Oral Take 360 mg by mouth at bedtime.      BP 126/67  Pulse 79  Temp(Src) 97.9 F (36.6 C) (Oral)  Resp 16  SpO2 100%  Physical Exam  Nursing note and vitals reviewed. Constitutional: She is oriented to person,  place, and time. She appears well-developed and well-nourished. No distress.  HENT:  Mouth/Throat: Oropharynx is clear and moist.  Eyes: Conjunctivae are normal. No scleral icterus.  Neck: Neck supple. No tracheal deviation present.       No stiffness/rigidity  Cardiovascular: Normal rate, normal heart sounds and intact distal pulses.   Pulmonary/Chest: Effort normal and breath sounds normal. No respiratory distress.  Abdominal: Soft. Normal appearance and bowel sounds are normal. She exhibits no distension and no mass. There is no tenderness. There is no rebound and no guarding.  Musculoskeletal: She exhibits no edema.  Neurological: She is alert and oriented to person, place, and time.  Skin: Skin is warm and dry. No rash noted.  Psychiatric: She has a normal mood and affect.    ED Course  Procedures (including critical care time)   Labs Reviewed  CBC  COMPREHENSIVE METABOLIC PANEL   Results for orders placed during the hospital encounter of 04/11/12  CBC      Component Value Range   WBC 7.7  4.0 - 10.5 (K/uL)   RBC 4.69  3.87 - 5.11 (MIL/uL)   Hemoglobin 14.0  12.0 - 15.0 (g/dL)   HCT 16.1  09.6 - 04.5 (%)   MCV 88.1  78.0 - 100.0 (fL)   MCH 29.9  26.0 - 34.0 (pg)   MCHC 33.9  30.0 - 36.0 (g/dL)   RDW 40.9  81.1 - 91.4 (%)   Platelets 298  150 - 400 (K/uL)  COMPREHENSIVE METABOLIC PANEL      Component Value Range   Sodium 135  135 - 145 (mEq/L)   Potassium 3.9  3.5 - 5.1 (mEq/L)   Chloride 97  96 - 112 (mEq/L)   CO2 26  19 - 32 (mEq/L)   Glucose, Bld 113 (*) 70 - 99 (mg/dL)   BUN 17  6 - 23 (mg/dL)   Creatinine, Ser 7.82  0.50 - 1.10 (mg/dL)   Calcium 9.5  8.4 - 95.6 (mg/dL)   Total Protein 6.6  6.0 - 8.3 (g/dL)   Albumin 3.9  3.5 - 5.2 (g/dL)   AST 28  0 - 37 (U/L)   ALT 19  0 - 35 (U/L)   Alkaline Phosphatase 65  39 - 117 (U/L)   Total Bilirubin 0.7  0.3 - 1.2 (mg/dL)   GFR calc non Af Amer 79 (*) >90 (mL/min)   GFR calc Af Amer >90  >90 (mL/min)     Dg  Abd Acute W/chest  04/11/2012  *RADIOLOGY REPORT*  Clinical Data: Epigastric pain and vomiting  ACUTE ABDOMEN SERIES (ABDOMEN 2 VIEW & CHEST 1 VIEW)  Comparison: 04/05/2012  Findings: Heart size is normal.  There is no pleural effusion or pulmonary edema  No airspace consolidation identified  The lungs appear hyperinflated and there are coarsened interstitial markings.  No airspace consolidation.  The bowel gas pattern is nonspecific.  No dilated loops of small bowel or air-fluid levels.  Previously noted left upper quadrant dilated small bowel loops have improved from previous exam.  There are postoperative changes noted within the lumbar spine.  IMPRESSION:  1.  There are no specific features identified to suggest high-grade bowel obstruction. 2.  No active cardiopulmonary abnormalities.  Original Report Authenticated By: Rosealee Albee, M.D.      MDM  Labs. Xr. Reviewed recent epic notes, discharge summary, recent lab/xray.   Dr Johna Sheriff has seen patient in ed and requests d/c home.  Pt/family agreeable w plan.   Discussed xrays/labs w pt.   On recheck, no abd pain. No nv. Tolerating po.     Suzi Roots, MD 04/11/12 [phone]

## 2012-04-11 NOTE — ED Notes (Signed)
Pt comes in from home with nausea and vomiting and abdominal pain that started yesterday after eating some stew.

## 2012-04-14 NOTE — Discharge Summary (Signed)
Physician Discharge Summary  Patient ID: Laurie Galloway MRN: [id number] DOB/AGE: 07/08/1930 78 y.o.  Admit date: 04/01/2012 Discharge date: 04/14/2012  Admission Diagnoses: S/P E LAP with SBO for ischemia, proximal ileum  Recurrent SBO  Patient Active Problem List   Diagnoses   .  Small bowel obstruction   .  HTN (hypertension)   .  Tachycardia   .  Gastrointestinal bleeding   .  Chronic back pain   .  Epigastric pain   .  Small bowel obstruction, partial       Discharge Diagnoses: Same Active Problems:  Small bowel obstruction, partial   PROCEDURES: None  Hospital Course: A recent admit 01/07/12 through 01/14/12 in which she had an Exploratory LAP and small bowel resection for ischemic small bowel with closed loop SBO. She had about 2 ft of what was thought to be the proximal ileum resected. She had been doing well at home and had follow ups in the office with Dr. Corliss Skains x 2 and was released.  She reports that she was continuing to do well until last night when she developed fairly sudden onset of abdominal pain which was diffuse and associated with some nausea, but no vomiting. She did have a small formed BM last night and had a small BM following her arrival here early this am when her pain would not resolve.She has been medicated with Fentanyl and given Zofran prophylactically and continues to hav.e diffuse abd pain although it is not as severe. She is hemodynamically stable and afebrile. WBC is 8.7. Electrolytes are essentially normal.  She was admitted and placed on bowel rest, IV fluids, and NG drainage. She slowly opened up and has been advanced to full liquids. Yesterday she had 3 Bowel movements, tolerating diet and wants to go home now.  We will plan D/C today on low fiber diet, continued mobilization, and follow up as needed. Her primary care is Dr. Cam Hai and she can also follow up with her.  Condition on D/C: Improved  Disposition: 06-Home-Health Care Svc   Disposition:  01-Home or Self Care   Medication List  As of 04/14/2012  1:34 PM   STOP taking these medications         psyllium 95 % Pack         TAKE these medications         acetaminophen 325 MG tablet   Commonly known as: TYLENOL   Take 1-2 tablets (325-650 mg total) by mouth every 6 (six) hours as needed.      beta carotene w/minerals tablet   Take 1 tablet by mouth daily.      calcium-vitamin D 500-200 MG-UNIT per tablet   Commonly known as: OSCAL WITH D   Take 1 tablet by mouth daily.      CELEBREX 200 MG capsule   Generic drug: celecoxib   Take 200 mg by mouth daily.      gabapentin 300 MG capsule   Commonly known as: NEURONTIN   Take 300 mg by mouth 3 (three) times daily.      losartan 100 MG tablet   Commonly known as: COZAAR   Take 100 mg by mouth daily.      omega-3 acid ethyl esters 1 G capsule   Commonly known as: LOVAZA   Take 1 g by mouth daily.      SYSTANE 0.4-0.3 % Soln   Generic drug: Polyethyl Glycol-Propyl Glycol   Place 1 drop into both eyes daily.  traMADol 50 MG tablet   Commonly known as: ULTRAM   Take 50 mg by mouth every 6 (six) hours as needed. TAKE 1-2 TABLETS EVERY 6 HOURS AS NEEDED      verapamil 360 MG 24 hr capsule   Commonly known as: VERELAN PM   Take 360 mg by mouth at bedtime.           Follow-up Information    Call Wynona Luna., MD. (As needed)    Contact information:   [facility] [address] [place] [address] [phone]       Schedule an appointment as soon as possible for a visit with Lupita Raider, MD. (As needed)    Contact information:   [address] [place] [address] [phone]          Signed: Sherrie George 04/14/2012, 1:34 PM

## 2013-02-11 ENCOUNTER — Other Ambulatory Visit: Payer: Self-pay | Admitting: Family Medicine

## 2013-02-18 ENCOUNTER — Other Ambulatory Visit: Payer: Self-pay | Admitting: Family Medicine

## 2013-02-18 ENCOUNTER — Ambulatory Visit
Admission: RE | Admit: 2013-02-18 | Discharge: 2013-02-18 | Disposition: A | Discharge status: Home / Self Care | Payer: Medicare Other | Source: Ambulatory Visit | Attending: Family Medicine | Admitting: Family Medicine

## 2013-02-18 DIAGNOSIS — I10 Essential (primary) hypertension: Secondary | ICD-10-CM

## 2013-02-26 ENCOUNTER — Other Ambulatory Visit: Payer: Self-pay

## 2013-02-26 DIAGNOSIS — Z1231 Encounter for screening mammogram for malignant neoplasm of breast: Secondary | ICD-10-CM

## 2013-03-24 ENCOUNTER — Ambulatory Visit
Admission: RE | Admit: 2013-03-24 | Discharge: 2013-03-24 | Disposition: A | Discharge status: Home / Self Care | Payer: Medicare Other | Source: Ambulatory Visit

## 2013-03-24 DIAGNOSIS — Z1231 Encounter for screening mammogram for malignant neoplasm of breast: Secondary | ICD-10-CM

## 2013-06-07 ENCOUNTER — Other Ambulatory Visit: Payer: Self-pay | Admitting: Endocrinology

## 2013-06-07 DIAGNOSIS — E0590 Thyrotoxicosis, unspecified without thyrotoxic crisis or storm: Secondary | ICD-10-CM

## 2013-06-10 ENCOUNTER — Other Ambulatory Visit (INDEPENDENT_AMBULATORY_CARE_PROVIDER_SITE_OTHER): Payer: Medicare Other

## 2013-06-10 DIAGNOSIS — E0590 Thyrotoxicosis, unspecified without thyrotoxic crisis or storm: Secondary | ICD-10-CM

## 2013-06-10 LAB — TSH: TSH: 2.51 u[IU]/mL (ref 0.35–5.50)

## 2013-06-10 LAB — T3: T3, Total: 79.2 ng/dL — ABNORMAL LOW (ref 80.0–204.0)

## 2013-06-10 LAB — T4, FREE: Free T4: 0.76 ng/dL (ref 0.60–1.60)

## 2013-06-15 ENCOUNTER — Ambulatory Visit (INDEPENDENT_AMBULATORY_CARE_PROVIDER_SITE_OTHER): Payer: Medicare Other | Admitting: Endocrinology

## 2013-06-15 ENCOUNTER — Encounter: Payer: Self-pay | Admitting: Endocrinology

## 2013-06-15 VITALS — BP 130/72 | HR 82 | Temp 98.6°F | Ht 64.0 in | Wt 129.5 lb

## 2013-06-15 DIAGNOSIS — E0590 Thyrotoxicosis, unspecified without thyrotoxic crisis or storm: Secondary | ICD-10-CM

## 2013-06-15 NOTE — Progress Notes (Signed)
Patient ID: Laurie Galloway, female   DOB: Aug 10, 1930, 79 y.o.   MRN: [id number]  Reason for Appointment:  Hyperthyroidism, followup  visit    History of Present Illness:   The Hyperthyroidism was first diagnosed in April 2014 when she had a routine tests on her TSH by the dermatologist    The symptoms consistent with hyperthyroidism  have been:  none of the following: No palpitations, shakiness, nervousness, feeling excessively warm and sweaty, and fatigue. No history of weight loss She did not have a goiter on initial exam.   The baseline TSH was 0.004 in March and in June it was 0.32. Baseline free T4 level was 1.17 but free T3 was normal before treatment. Last Free T4 was 0.59 in June and also free T3 was low at 1.7  The treatments that the patient has taken include methimazole 5 mg twice a day since April through June when it was stopped.      She does still complain of some muscle cramps which is not new and not any different with stopping her methimazole.        Appointment on 06/10/2013  Component Date Value Range Status  . Free T4 06/10/2013 0.76  0.60 - 1.60 ng/dL Final  . TSH 11/91/4782 2.51  0.35 - 5.50 uIU/mL Final  . T3, Total 06/10/2013 79.2* 80.0 - 204.0 ng/dL Final      Medication List       This list is accurate as of: 06/15/13 10:51 AM.  Always use your most recent med list.               beta carotene w/minerals tablet  Take 1 tablet by mouth daily.     calcium-vitamin D 500-200 MG-UNIT per tablet  Commonly known as:  OSCAL WITH D  Take 1 tablet by mouth daily.     CELEBREX 200 MG capsule  Generic drug:  celecoxib  Take 200 mg by mouth daily.     clobetasol cream 0.05 %  Commonly known as:  TEMOVATE     gabapentin 300 MG capsule  Commonly known as:  NEURONTIN  Take 300 mg by mouth 3 (three) times daily.     ibandronate 150 MG tablet  Commonly known as:  BONIVA     losartan 100 MG tablet  Commonly known as:  COZAAR  Take 100 mg by mouth daily.      omega-3 acid ethyl esters 1 G capsule  Commonly known as:  LOVAZA  Take 1 g by mouth daily.     SYSTANE 0.4-0.3 % Soln  Generic drug:  Polyethyl Glycol-Propyl Glycol  Place 1 drop into both eyes daily.     tobramycin 0.3 % ophthalmic solution  Commonly known as:  TOBREX     traMADol 50 MG tablet  Commonly known as:  ULTRAM  Take 50 mg by mouth every 6 (six) hours as needed. TAKE 1-2 TABLETS EVERY 6 HOURS AS NEEDED     verapamil 360 MG 24 hr capsule  Commonly known as:  VERELAN PM  Take 360 mg by mouth at bedtime.            Past Medical History  Diagnosis Date  . Back problem   . Hypertension   . PONV (postoperative nausea and vomiting)     Past Surgical History  Procedure Laterality Date  . Back surgery    . Appendectomy    . Abdominal hysterectomy    . Tonsillectomy  age 89  . Colon surgery  01-08-12  . Rotator cuff repair  1995  . Laparotomy  01/08/2012    Procedure: EXPLORATORY LAPAROTOMY;  Surgeon: Wilmon Arms. Corliss Skains, MD;  Location: [facility];  Service: General;  Laterality: N/A;  . Bowel resection  01/08/2012    Procedure: SMALL BOWEL RESECTION;  Surgeon: Wilmon Arms. Corliss Skains, MD;  Location: [facility];  Service: General;  Laterality: N/A;    No family history on file.  Social History:  reports that she has never smoked. She has never used smokeless tobacco. She reports that she does not drink alcohol or use illicit drugs.  Allergies:  Allergies  Allergen Reactions  . Penicillins Rash  . Sulfa Antibiotics Rash    Review of Systems:  CARDIOLOGY:  she has a  history of high blood pressure.       History of macular degeneration of her left eye History of osteoporosis on treatment History of adrenal adenoma  ENDOCRINOLOGY:  no history of Diabetes.            Examination:  BP 130/72  Pulse 82  Temp(Src) 98.6 F (37 C)  Ht 5\' 4"  (1.626 m)  Wt 129 lb 8 oz (58.741 kg)  BMI 22.22 kg/m2  SpO2 97%   General Appearance: pleasant, not anxious or hyperkinetic..           Eyes: No proptosis or eyelid swelling .          Neck: The thyroid is enlarged 2x normal, nodular on left and smooth surface with slightly firm consistently  Heart: normal S1 and S2, no murmurs .          Neurological: REFLEXES: at biceps  pair slightly slow relaxation  TREMORS: nofine tremors are present..     Assessments 1. Hyperthyroidism - 242.90  She probably has early subclinical hyperthyroidism in March and March this was related to silent thyroiditis. However she did have a positive TSI antibody and a family history of autoimmune disease. However with only a short course of treatment with methimazole her thyroid levels are quite normal now and she does not have a goiter. Most likely she is in remission now.  She will discuss her muscle cramps with PCP  HYPERTENSION: Well controlled   Treatment:  She will continue to stay off methimazole and have followup thyroid levels checked in 3 months and then as needed Discussed the above plan and explained nature of Graves' disease and implications of autoimmune thyroid disease Also discussed that there is a possibility she may transform into hypothyroidism at some point also   Birch Farino 06/15/2013, 10:51 AM

## 2013-06-15 NOTE — Patient Instructions (Addendum)
No meds for thyroid

## 2013-06-18 DIAGNOSIS — E0590 Thyrotoxicosis, unspecified without thyrotoxic crisis or storm: Secondary | ICD-10-CM | POA: Insufficient documentation

## 2013-06-18 HISTORY — DX: Thyrotoxicosis, unspecified without thyrotoxic crisis or storm: E05.90

## 2013-06-30 ENCOUNTER — Other Ambulatory Visit: Payer: Self-pay | Admitting: Gastroenterology

## 2013-06-30 DIAGNOSIS — L821 Other seborrheic keratosis: Secondary | ICD-10-CM

## 2013-07-02 ENCOUNTER — Ambulatory Visit
Admission: RE | Admit: 2013-07-02 | Discharge: 2013-07-02 | Disposition: A | Discharge status: Home / Self Care | Payer: Medicare Other | Source: Ambulatory Visit | Attending: Gastroenterology | Admitting: Gastroenterology

## 2013-07-02 DIAGNOSIS — L821 Other seborrheic keratosis: Secondary | ICD-10-CM

## 2013-07-02 MED ORDER — IOHEXOL 300 MG/ML  SOLN
100.0000 mL | Freq: Once | INTRAMUSCULAR | Status: AC | PRN
  Administered 2013-07-02: 100 mL via INTRAVENOUS

## 2013-08-03 ENCOUNTER — Other Ambulatory Visit: Payer: Self-pay | Admitting: Gastroenterology

## 2013-08-03 ENCOUNTER — Encounter (HOSPITAL_COMMUNITY): Payer: Self-pay | Admitting: *Deleted

## 2013-08-05 ENCOUNTER — Encounter (HOSPITAL_COMMUNITY): Payer: Self-pay | Admitting: Pharmacy Technician

## 2013-08-24 ENCOUNTER — Encounter (HOSPITAL_COMMUNITY)
Admission: RE | Disposition: A | Discharge status: Home / Self Care | Payer: Self-pay | Source: Ambulatory Visit | Attending: Gastroenterology

## 2013-08-24 ENCOUNTER — Ambulatory Visit (HOSPITAL_COMMUNITY): Payer: Medicare Other | Admitting: Anesthesiology

## 2013-08-24 ENCOUNTER — Encounter (HOSPITAL_COMMUNITY): Payer: Self-pay | Admitting: Anesthesiology

## 2013-08-24 ENCOUNTER — Ambulatory Visit (HOSPITAL_COMMUNITY)
Admission: RE | Admit: 2013-08-24 | Discharge: 2013-08-24 | Disposition: A | Discharge status: Home / Self Care | Payer: Medicare Other | Source: Ambulatory Visit | Attending: Gastroenterology | Admitting: Gastroenterology

## 2013-08-24 ENCOUNTER — Encounter (HOSPITAL_COMMUNITY): Payer: Self-pay | Admitting: *Deleted

## 2013-08-24 DIAGNOSIS — L851 Acquired keratosis [keratoderma] palmaris et plantaris: Secondary | ICD-10-CM | POA: Insufficient documentation

## 2013-08-24 DIAGNOSIS — K5730 Diverticulosis of large intestine without perforation or abscess without bleeding: Secondary | ICD-10-CM | POA: Insufficient documentation

## 2013-08-24 DIAGNOSIS — I10 Essential (primary) hypertension: Secondary | ICD-10-CM | POA: Insufficient documentation

## 2013-08-24 DIAGNOSIS — E279 Disorder of adrenal gland, unspecified: Secondary | ICD-10-CM | POA: Insufficient documentation

## 2013-08-24 DIAGNOSIS — M810 Age-related osteoporosis without current pathological fracture: Secondary | ICD-10-CM | POA: Insufficient documentation

## 2013-08-24 DIAGNOSIS — R911 Solitary pulmonary nodule: Secondary | ICD-10-CM | POA: Insufficient documentation

## 2013-08-24 HISTORY — DX: Unspecified osteoarthritis, unspecified site: M19.90

## 2013-08-24 HISTORY — PX: ESOPHAGOGASTRODUODENOSCOPY (EGD) WITH PROPOFOL: SHX5813

## 2013-08-24 HISTORY — PX: COLONOSCOPY WITH PROPOFOL: SHX5780

## 2013-08-24 SURGERY — ESOPHAGOGASTRODUODENOSCOPY (EGD) WITH PROPOFOL
Anesthesia: Monitor Anesthesia Care

## 2013-08-24 MED ORDER — SODIUM CHLORIDE 0.9 % IV SOLN: INTRAVENOUS | Status: DC

## 2013-08-24 MED ORDER — LACTATED RINGERS IV SOLN
INTRAVENOUS | Status: DC | PRN
  Administered 2013-08-24: 08:00:00 via INTRAVENOUS

## 2013-08-24 MED ORDER — KETAMINE HCL 50 MG/ML IJ SOLN
INTRAMUSCULAR | Status: DC | PRN
  Administered 2013-08-24: 25 mg via INTRAMUSCULAR

## 2013-08-24 MED ORDER — BUTAMBEN-TETRACAINE-BENZOCAINE 2-2-14 % EX AERO
INHALATION_SPRAY | CUTANEOUS | Status: DC | PRN
  Administered 2013-08-24: 2 via TOPICAL

## 2013-08-24 MED ORDER — LACTATED RINGERS IV SOLN
INTRAVENOUS | Status: DC
  Administered 2013-08-24: 1000 mL via INTRAVENOUS

## 2013-08-24 MED ORDER — FENTANYL CITRATE 0.05 MG/ML IJ SOLN: 25.0000 ug | INTRAMUSCULAR | Status: DC | PRN

## 2013-08-24 MED ORDER — PROPOFOL 10 MG/ML IV BOLUS
INTRAVENOUS | Status: DC | PRN
  Administered 2013-08-24: 25 mg via INTRAVENOUS
  Administered 2013-08-24: 50 mg via INTRAVENOUS
  Administered 2013-08-24 (×3): 25 mg via INTRAVENOUS

## 2013-08-24 SURGICAL SUPPLY — 25 items

## 2013-08-24 NOTE — Anesthesia Postprocedure Evaluation (Signed)
  Anesthesia Post-op Note  Patient: Azhia A Flight  Procedure(s) Performed: Procedure(s) (LRB): ESOPHAGOGASTRODUODENOSCOPY (EGD) WITH PROPOFOL (N/A) COLONOSCOPY WITH PROPOFOL (N/A)  Patient Location: PACU  Anesthesia Type: MAC  Level of Consciousness: awake and alert   Airway and Oxygen Therapy: Patient Spontanous Breathing  Post-op Pain: mild  Post-op Assessment: Post-op Vital signs reviewed, Patient's Cardiovascular Status Stable, Respiratory Function Stable, Patent Airway and No signs of Nausea or vomiting  Last Vitals:  Filed Vitals:   08/24/13 0912  BP:   Pulse:   Temp: 36.6 C  Resp:     Post-op Vital Signs: stable   Complications: No apparent anesthesia complications

## 2013-08-24 NOTE — Transfer of Care (Signed)
Immediate Anesthesia Transfer of Care Note  Patient: Laurie Galloway  Procedure(s) Performed: Procedure(s): ESOPHAGOGASTRODUODENOSCOPY (EGD) WITH PROPOFOL (N/A) COLONOSCOPY WITH PROPOFOL (N/A)  Patient Location: PACU  Anesthesia Type:MAC  Level of Consciousness: awake, alert , oriented and patient cooperative  Airway & Oxygen Therapy: Patient Spontanous Breathing and Patient connected to face mask oxygen  Post-op Assessment: Report given to PACU RN and Post -op Vital signs reviewed and stable  Post vital signs: Reviewed and stable  Complications: No apparent anesthesia complications

## 2013-08-24 NOTE — Op Note (Signed)
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 The Pentax gastroscope was passed through the posterior hypopharynx into the proximal esophagus without difficulty. The hypopharynx, larynx, and vocal cords appeared normal.  Esophagoscopy: The proximal, mid, and lower segments of the esophageal mucosa appeared normal. The squamocolumnar junction was regular and noted at 40 cm from the incisor teeth. There was no endoscopic evidence for the presence of erosive esophagitis or Barrett's esophagus.  Gastroscopy: Retroflex view of the gastric cardia and fundus was normal. The gastric body, antrum, and pylorus appeared normal.  Duodenoscopy: The duodenal bulb and descending duodenum appeared normal.  Assessment: Normal screening esophagogastroduodenoscopy  Procedure: Screening colonoscopy Anal inspection and digital rectal exam were normal. The Pentax pediatric colonoscope was introduced into the rectum and advanced to the cecum. A normal-appearing ileocecal valve and appendiceal orifice were identified. Colonic preparation for the exam today was good.  Rectum. Normal. Retroflexed view of the distal rectum normal.  Sigmoid colon and descending colon. Normal.  Splenic flexure. Normal.  Transverse colon. Normal.  Hepatic flexure. Normal.  Ascending colon. Normal.  Cecum and ileocecal valve. Normal.   Assessment:  #1. Normal screening proctocolonoscopy to the cecum  #2. Universal colonic diverticulosis

## 2013-08-24 NOTE — H&P (Signed)
  Problem: Leser-Trelat syndrome  History: The patient is an 79 year old female born [birth year]. The patient underwent a normal screening colonoscopy in March 2009.  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 The patient was referred to me to rule out  internal malignancy causing the multiple skin keratoses.  The patient underwent a CT scan of the chest which showed a 4 mm right lung pulmonary nodule. She underwent a CT scan of the abdomen and pelvis which showed a stable right adrenal incidentaloma.  The patient is scheduled to undergo a screening esophagogastroduodenoscopy and colonoscopy to rule out a malignancy.  Medication allergies: Sulfa drugs cause rash. Penicillin causes rash. ACE inhibitors cause cough.  Past medical history: Hypertension. Osteoarthritis. Adrenal incidentaloma. Osteoporosis. Macular degeneration. Hyperthyroidism. Total abdominal hysterectomy. Rotator cuff surgery. Tonsillectomy. Back surgery. Surgery to treat small bowel obstruction in January 2013.  Exam: The patient is alert and lying comfortably on the endoscopy stretcher. Lungs are clear to auscultation. Cardiac exam reveals a regular rhythm. Lungs are clear to auscultation.  Plan: Proceed with screening esophagogastroduodenoscopy and colonoscopy to rule out malignancy to

## 2013-08-24 NOTE — Anesthesia Preprocedure Evaluation (Addendum)
Anesthesia Evaluation  Patient identified by MRN, date of birth, ID band Patient awake    Reviewed: Allergy & Precautions, H&P , NPO status , Patient's Chart, lab work & pertinent test results  History of Anesthesia Complications (+) PONV  Airway Mallampati: II TM Distance: >3 FB Neck ROM: full    Dental no notable dental hx. (+) Teeth Intact and Dental Advisory Given   Pulmonary neg pulmonary ROS,  breath sounds clear to auscultation  Pulmonary exam normal       Cardiovascular Exercise Tolerance: Good hypertension, Pt. on medications Rhythm:regular Rate:Normal  History tachycardia   Neuro/Psych negative neurological ROS  negative psych ROS   GI/Hepatic negative GI ROS, Neg liver ROS,   Endo/Other  negative endocrine ROSHyperthyroidism Subclinical hyperthyroidism  Renal/GU negative Renal ROS  negative genitourinary   Musculoskeletal   Abdominal   Peds  Hematology negative hematology ROS (+)   Anesthesia Other Findings   Reproductive/Obstetrics negative OB ROS                          Anesthesia Physical Anesthesia Plan  ASA: III  Anesthesia Plan: MAC   Post-op Pain Management:    Induction:   Airway Management Planned: Simple Face Mask  Additional Equipment:   Intra-op Plan:   Post-operative Plan:   Informed Consent: I have reviewed the patients History and Physical, chart, labs and discussed the procedure including the risks, benefits and alternatives for the proposed anesthesia with the patient or authorized representative who has indicated his/her understanding and acceptance.   Dental Advisory Given  Plan Discussed with: CRNA and Surgeon  Anesthesia Plan Comments:         Anesthesia Quick Evaluation

## 2013-08-25 ENCOUNTER — Encounter (HOSPITAL_COMMUNITY): Payer: Self-pay | Admitting: Gastroenterology

## 2013-09-15 ENCOUNTER — Ambulatory Visit (INDEPENDENT_AMBULATORY_CARE_PROVIDER_SITE_OTHER): Payer: Medicare Other | Admitting: Endocrinology

## 2013-09-15 ENCOUNTER — Encounter: Payer: Self-pay | Admitting: Endocrinology

## 2013-09-15 VITALS — BP 130/78 | HR 72 | Temp 98.3°F | Resp 12 | Ht 63.0 in | Wt 136.4 lb

## 2013-09-15 DIAGNOSIS — E0590 Thyrotoxicosis, unspecified without thyrotoxic crisis or storm: Secondary | ICD-10-CM

## 2013-09-15 LAB — T4, FREE: Free T4: 0.8 ng/dL (ref 0.60–1.60)

## 2013-09-15 LAB — TSH: TSH: 1.73 u[IU]/mL (ref 0.35–5.50)

## 2013-09-15 NOTE — Progress Notes (Signed)
Patient ID: Laurie Galloway, female   DOB: May 08, 1930, 79 y.o.   MRN: [id number]  Reason for Appointment:  Hyperthyroidism, followup  visit    History of Present Illness:   The Hyperthyroidism was first diagnosed in April 2014 when she had a routine tests on her TSH by the dermatologist    The symptoms consistent with hyperthyroidism had been:  none of the following: No palpitations, shakiness, nervousness, feeling excessively warm and sweaty, and fatigue. No history of weight loss She did not have a goiter on initial exam.   The baseline TSH was 0.004 in March and in June it was 0.32. Baseline free T4 level was 1.17 but free T3 was normal before treatment. Last Free T4 was 0.59 in June and also free T3 was low at 1.7 TSI antibody was positive  The treatments that the patient has taken include methimazole 5 mg twice a day since April through June when it was stopped.  Subsequently her thyroid levels have been normal  Currently the patient has had some weight gain but does not feel any palpitations, shakiness or has any fatigue     No visits with results within 1 Week(s) from this visit. Latest known visit with results is:  Appointment on 06/10/2013  Component Date Value Range Status  . Free T4 06/10/2013 0.76  0.60 - 1.60 ng/dL Final  . TSH 40/98/1191 2.51  0.35 - 5.50 uIU/mL Final  . T3, Total 06/10/2013 79.2* 80.0 - 204.0 ng/dL Final      Medication List       This list is accurate as of: 09/15/13  3:07 PM.  Always use your most recent med list.               betamethasone valerate 0.1 % cream  Commonly known as:  VALISONE     calcium carbonate 600 MG Tabs tablet  Commonly known as:  OS-CAL  Take 600 mg by mouth 2 (two) times daily with a meal.     CELEBREX 200 MG capsule  Generic drug:  celecoxib  Take 200 mg by mouth daily.     clobetasol cream 0.05 %  Commonly known as:  TEMOVATE     Fish Oil 300 MG Caps  Take 300 mg by mouth daily.     glucosamine-chondroitin 500-400 MG tablet  Take 1 tablet by mouth 2 (two) times daily.     ibandronate 150 MG tablet  Commonly known as:  BONIVA     losartan 100 MG tablet  Commonly known as:  COZAAR  Take 100 mg by mouth every morning.     polyethylene glycol packet  Commonly known as:  MIRALAX / GLYCOLAX  Take 17 g by mouth daily.     polyethylene glycol-electrolytes 420 G solution  Commonly known as:  NuLYTELY/GoLYTELY     PRESERVISION AREDS 2 PO  Take 1 tablet by mouth 2 (two) times daily.     SYSTANE 0.4-0.3 % Soln  Generic drug:  Polyethyl Glycol-Propyl Glycol  Place 1 drop into both eyes daily.     tobramycin 0.3 % ophthalmic solution  Commonly known as:  TOBREX  Place 1 drop into the left eye. Uses this every time she has to go get shots in her eyes every 9 weeks-next dose is 08/11/13     verapamil 360 MG 24 hr capsule  Commonly known as:  VERELAN PM  Take 360 mg by mouth every morning.     vitamin C 500 MG tablet  Commonly known as:  ASCORBIC ACID  Take 500 mg by mouth daily.     Vitamin D2 2000 UNITS Tabs  Take 1 tablet by mouth daily.            Past Medical History  Diagnosis Date  . Back problem   . Hypertension   . PONV (postoperative nausea and vomiting)   . Arthritis     Past Surgical History  Procedure Laterality Date  . Back surgery    . Appendectomy    . Abdominal hysterectomy    . Tonsillectomy      age 64  . Colon surgery  01-08-12  . Rotator cuff repair  1995  . Laparotomy  01/08/2012    Procedure: EXPLORATORY LAPAROTOMY;  Surgeon: Wilmon Arms. Corliss Skains, MD;  Location: [facility];  Service: General;  Laterality: N/A;  . Bowel resection  01/08/2012    Procedure: SMALL BOWEL RESECTION;  Surgeon: Wilmon Arms. Corliss Skains, MD;  Location: [facility];  Service: General;  Laterality: N/A;  . External ear surgery      stapedectomy bilateral  . Esophagogastroduodenoscopy (egd) with propofol N/A 08/24/2013    Procedure: ESOPHAGOGASTRODUODENOSCOPY (EGD) WITH PROPOFOL;   Surgeon: Charolett Bumpers, MD;  Location: [facility];  Service: Endoscopy;  Laterality: N/A;  . Colonoscopy with propofol N/A 08/24/2013    Procedure: COLONOSCOPY WITH PROPOFOL;  Surgeon: Charolett Bumpers, MD;  Location: [facility];  Service: Endoscopy;  Laterality: N/A;    No family history on file.  Social History:  reports that she has never smoked. She has never used smokeless tobacco. She reports that she does not drink alcohol or use illicit drugs.  Allergies:  Allergies  Allergen Reactions  . Penicillins Rash  . Sulfa Antibiotics Rash    Review of Systems:  History of adrenal adenoma  ENDOCRINOLOGY:  no history of Diabetes.            Examination:  BP 130/78  Pulse 72  Temp(Src) 98.3 F (36.8 C)  Resp 12  Ht 5\' 3"  (1.6 m)  Wt 136 lb 6.4 oz (61.871 kg)  BMI 24.17 kg/m2  SpO2 95%   General Appearance: pleasant, not anxious       Eyes: No proptosis or eyelid swelling, no lid lag .          Neck: The thyroid is barely palpable on the right side, slightly firm Neurological: REFLEXES: at biceps appear normal  TREMORS:  none.     Assessments Hyperthyroidism - 242.90  She had  subclinical hyperthyroidism in March and this was probably related to silent thyroiditis. However she did have a positive TSI antibody  However with only a short course of treatment with methimazole her thyroid levels  normalized Most likely she is in remission now.  Plan: Will check her thyroid levels today and if normal no further followup may be needed, she can continue to follow with PCP  [facility] 09/15/2013, 3:07 PM   Addendum: Labs show normal thyroid functions, no further followup needed  Office Visit on 09/15/2013  Component Date Value Range Status  . Free T4 09/15/2013 0.80  0.60 - 1.60 ng/dL Final  . TSH 16/09/9603 1.73  0.35 - 5.50 uIU/mL Final

## 2013-09-16 NOTE — Progress Notes (Signed)
Quick Note:  Please let patient know that the thyroid result is normal and no further followup needed ______

## 2014-02-23 ENCOUNTER — Other Ambulatory Visit: Payer: Self-pay

## 2014-02-23 DIAGNOSIS — Z1231 Encounter for screening mammogram for malignant neoplasm of breast: Secondary | ICD-10-CM

## 2014-03-28 ENCOUNTER — Ambulatory Visit
Admission: RE | Admit: 2014-03-28 | Discharge: 2014-03-28 | Disposition: A | Discharge status: Home / Self Care | Payer: Commercial Managed Care - HMO | Source: Ambulatory Visit

## 2014-03-28 ENCOUNTER — Encounter (INDEPENDENT_AMBULATORY_CARE_PROVIDER_SITE_OTHER): Payer: Self-pay

## 2014-03-28 DIAGNOSIS — Z1231 Encounter for screening mammogram for malignant neoplasm of breast: Secondary | ICD-10-CM

## 2015-01-24 DIAGNOSIS — H43813 Vitreous degeneration, bilateral: Secondary | ICD-10-CM | POA: Diagnosis not present

## 2015-01-24 DIAGNOSIS — H3531 Nonexudative age-related macular degeneration: Secondary | ICD-10-CM | POA: Diagnosis not present

## 2015-01-24 DIAGNOSIS — H3532 Exudative age-related macular degeneration: Secondary | ICD-10-CM | POA: Diagnosis not present

## 2015-02-21 ENCOUNTER — Other Ambulatory Visit: Payer: Self-pay

## 2015-02-21 DIAGNOSIS — Z9289 Personal history of other medical treatment: Secondary | ICD-10-CM

## 2015-03-28 DIAGNOSIS — H3532 Exudative age-related macular degeneration: Secondary | ICD-10-CM | POA: Diagnosis not present

## 2015-03-28 DIAGNOSIS — H43813 Vitreous degeneration, bilateral: Secondary | ICD-10-CM | POA: Diagnosis not present

## 2015-03-28 DIAGNOSIS — H3531 Nonexudative age-related macular degeneration: Secondary | ICD-10-CM | POA: Diagnosis not present

## 2015-04-04 ENCOUNTER — Ambulatory Visit: Payer: Commercial Managed Care - HMO

## 2015-04-04 ENCOUNTER — Ambulatory Visit
Admission: RE | Admit: 2015-04-04 | Discharge: 2015-04-04 | Disposition: A | Discharge status: Home / Self Care | Payer: Commercial Managed Care - HMO | Source: Ambulatory Visit

## 2015-04-04 DIAGNOSIS — R3915 Urgency of urination: Secondary | ICD-10-CM | POA: Diagnosis not present

## 2015-04-04 DIAGNOSIS — Z1231 Encounter for screening mammogram for malignant neoplasm of breast: Secondary | ICD-10-CM | POA: Diagnosis not present

## 2015-04-04 DIAGNOSIS — Z9289 Personal history of other medical treatment: Secondary | ICD-10-CM

## 2015-05-11 DIAGNOSIS — D692 Other nonthrombocytopenic purpura: Secondary | ICD-10-CM | POA: Diagnosis not present

## 2015-05-11 DIAGNOSIS — S66211A Strain of extensor muscle, fascia and tendon of right thumb at wrist and hand level, initial encounter: Secondary | ICD-10-CM | POA: Diagnosis not present

## 2015-05-26 DIAGNOSIS — H43813 Vitreous degeneration, bilateral: Secondary | ICD-10-CM | POA: Diagnosis not present

## 2015-05-26 DIAGNOSIS — H3532 Exudative age-related macular degeneration: Secondary | ICD-10-CM | POA: Diagnosis not present

## 2015-05-26 DIAGNOSIS — H3531 Nonexudative age-related macular degeneration: Secondary | ICD-10-CM | POA: Diagnosis not present

## 2015-08-03 DIAGNOSIS — H3531 Nonexudative age-related macular degeneration: Secondary | ICD-10-CM | POA: Diagnosis not present

## 2015-08-03 DIAGNOSIS — H43813 Vitreous degeneration, bilateral: Secondary | ICD-10-CM | POA: Diagnosis not present

## 2015-08-03 DIAGNOSIS — H3532 Exudative age-related macular degeneration: Secondary | ICD-10-CM | POA: Diagnosis not present

## 2015-09-12 ENCOUNTER — Ambulatory Visit (INDEPENDENT_AMBULATORY_CARE_PROVIDER_SITE_OTHER): Payer: Commercial Managed Care - HMO

## 2015-09-12 DIAGNOSIS — Z23 Encounter for immunization: Secondary | ICD-10-CM | POA: Diagnosis not present

## 2015-10-13 DIAGNOSIS — H353221 Exudative age-related macular degeneration, left eye, with active choroidal neovascularization: Secondary | ICD-10-CM | POA: Diagnosis not present

## 2015-10-13 DIAGNOSIS — H353112 Nonexudative age-related macular degeneration, right eye, intermediate dry stage: Secondary | ICD-10-CM | POA: Diagnosis not present

## 2015-10-20 DIAGNOSIS — E0590 Thyrotoxicosis, unspecified without thyrotoxic crisis or storm: Secondary | ICD-10-CM | POA: Diagnosis not present

## 2015-10-20 DIAGNOSIS — I10 Essential (primary) hypertension: Secondary | ICD-10-CM | POA: Diagnosis not present

## 2015-10-20 DIAGNOSIS — M150 Primary generalized (osteo)arthritis: Secondary | ICD-10-CM | POA: Diagnosis not present

## 2015-10-20 DIAGNOSIS — D692 Other nonthrombocytopenic purpura: Secondary | ICD-10-CM | POA: Diagnosis not present

## 2015-10-20 DIAGNOSIS — J301 Allergic rhinitis due to pollen: Secondary | ICD-10-CM | POA: Diagnosis not present

## 2015-10-20 DIAGNOSIS — M810 Age-related osteoporosis without current pathological fracture: Secondary | ICD-10-CM | POA: Diagnosis not present

## 2015-10-20 DIAGNOSIS — Z0000 Encounter for general adult medical examination without abnormal findings: Secondary | ICD-10-CM | POA: Diagnosis not present

## 2015-10-20 DIAGNOSIS — E782 Mixed hyperlipidemia: Secondary | ICD-10-CM | POA: Diagnosis not present

## 2015-12-08 DIAGNOSIS — H353221 Exudative age-related macular degeneration, left eye, with active choroidal neovascularization: Secondary | ICD-10-CM | POA: Diagnosis not present

## 2015-12-08 DIAGNOSIS — H353113 Nonexudative age-related macular degeneration, right eye, advanced atrophic without subfoveal involvement: Secondary | ICD-10-CM | POA: Diagnosis not present

## 2015-12-08 DIAGNOSIS — H43813 Vitreous degeneration, bilateral: Secondary | ICD-10-CM | POA: Diagnosis not present

## 2016-01-04 DIAGNOSIS — R300 Dysuria: Secondary | ICD-10-CM | POA: Diagnosis not present

## 2016-01-04 DIAGNOSIS — N390 Urinary tract infection, site not specified: Secondary | ICD-10-CM | POA: Diagnosis not present

## 2016-01-17 DIAGNOSIS — G5632 Lesion of radial nerve, left upper limb: Secondary | ICD-10-CM | POA: Diagnosis not present

## 2016-02-02 DIAGNOSIS — H43813 Vitreous degeneration, bilateral: Secondary | ICD-10-CM | POA: Diagnosis not present

## 2016-02-02 DIAGNOSIS — H353113 Nonexudative age-related macular degeneration, right eye, advanced atrophic without subfoveal involvement: Secondary | ICD-10-CM | POA: Diagnosis not present

## 2016-02-02 DIAGNOSIS — H353221 Exudative age-related macular degeneration, left eye, with active choroidal neovascularization: Secondary | ICD-10-CM | POA: Diagnosis not present

## 2016-02-21 ENCOUNTER — Other Ambulatory Visit: Payer: Self-pay

## 2016-02-21 DIAGNOSIS — Z1231 Encounter for screening mammogram for malignant neoplasm of breast: Secondary | ICD-10-CM

## 2016-04-04 ENCOUNTER — Ambulatory Visit
Admission: RE | Admit: 2016-04-04 | Discharge: 2016-04-04 | Disposition: A | Discharge status: Home / Self Care | Payer: Commercial Managed Care - HMO | Source: Ambulatory Visit

## 2016-04-04 DIAGNOSIS — Z1231 Encounter for screening mammogram for malignant neoplasm of breast: Secondary | ICD-10-CM | POA: Diagnosis not present

## 2016-04-10 DIAGNOSIS — H35421 Microcystoid degeneration of retina, right eye: Secondary | ICD-10-CM | POA: Diagnosis not present

## 2016-04-10 DIAGNOSIS — H353113 Nonexudative age-related macular degeneration, right eye, advanced atrophic without subfoveal involvement: Secondary | ICD-10-CM | POA: Diagnosis not present

## 2016-04-10 DIAGNOSIS — H43813 Vitreous degeneration, bilateral: Secondary | ICD-10-CM | POA: Diagnosis not present

## 2016-04-10 DIAGNOSIS — H353221 Exudative age-related macular degeneration, left eye, with active choroidal neovascularization: Secondary | ICD-10-CM | POA: Diagnosis not present

## 2016-05-14 DIAGNOSIS — M19042 Primary osteoarthritis, left hand: Secondary | ICD-10-CM | POA: Diagnosis not present

## 2016-05-14 DIAGNOSIS — M180 Bilateral primary osteoarthritis of first carpometacarpal joints: Secondary | ICD-10-CM | POA: Diagnosis not present

## 2016-05-14 DIAGNOSIS — M19041 Primary osteoarthritis, right hand: Secondary | ICD-10-CM | POA: Diagnosis not present

## 2016-05-14 DIAGNOSIS — G5602 Carpal tunnel syndrome, left upper limb: Secondary | ICD-10-CM | POA: Diagnosis not present

## 2016-06-18 DIAGNOSIS — L710 Perioral dermatitis: Secondary | ICD-10-CM | POA: Diagnosis not present

## 2016-06-19 DIAGNOSIS — G5602 Carpal tunnel syndrome, left upper limb: Secondary | ICD-10-CM | POA: Diagnosis not present

## 2016-07-03 DIAGNOSIS — H35421 Microcystoid degeneration of retina, right eye: Secondary | ICD-10-CM | POA: Diagnosis not present

## 2016-07-03 DIAGNOSIS — H353221 Exudative age-related macular degeneration, left eye, with active choroidal neovascularization: Secondary | ICD-10-CM | POA: Diagnosis not present

## 2016-07-03 DIAGNOSIS — H353113 Nonexudative age-related macular degeneration, right eye, advanced atrophic without subfoveal involvement: Secondary | ICD-10-CM | POA: Diagnosis not present

## 2016-07-03 DIAGNOSIS — H43813 Vitreous degeneration, bilateral: Secondary | ICD-10-CM | POA: Diagnosis not present

## 2016-07-17 DIAGNOSIS — L710 Perioral dermatitis: Secondary | ICD-10-CM | POA: Diagnosis not present

## 2016-08-02 DIAGNOSIS — G5602 Carpal tunnel syndrome, left upper limb: Secondary | ICD-10-CM

## 2016-08-02 HISTORY — DX: Carpal tunnel syndrome, left upper limb: G56.02

## 2016-08-20 ENCOUNTER — Other Ambulatory Visit: Payer: Self-pay | Admitting: Orthopedic Surgery

## 2016-08-22 ENCOUNTER — Encounter (HOSPITAL_BASED_OUTPATIENT_CLINIC_OR_DEPARTMENT_OTHER): Payer: Self-pay | Admitting: *Deleted

## 2016-08-22 NOTE — Progress Notes (Signed)
   08/22/16 1608  OBSTRUCTIVE SLEEP APNEA  Have you ever been diagnosed with sleep apnea through a sleep study? No  Do you snore loudly (loud enough to be heard through closed doors)?  1  Do you often feel tired, fatigued, or sleepy during the daytime (such as falling asleep during driving or talking to someone)? 1  Has anyone observed you stop breathing during your sleep? 0  Do you have, or are you being treated for high blood pressure? 1  BMI more than 35 kg/m2? 0  Age > 24 (1-yes) 1  Female Gender (Yes=1) 0  Obstructive Sleep Apnea Score 4  Score 5 or greater  Results sent to PCP (Dr. Mayra Neer)

## 2016-08-22 NOTE — Pre-Procedure Instructions (Signed)
To come for EKG 

## 2016-08-23 ENCOUNTER — Encounter (HOSPITAL_BASED_OUTPATIENT_CLINIC_OR_DEPARTMENT_OTHER)
Admission: RE | Admit: 2016-08-23 | Discharge: 2016-08-23 | Disposition: A | Discharge status: Home / Self Care | Payer: Commercial Managed Care - HMO | Source: Ambulatory Visit | Attending: Orthopedic Surgery | Admitting: Orthopedic Surgery

## 2016-08-23 DIAGNOSIS — I10 Essential (primary) hypertension: Secondary | ICD-10-CM | POA: Insufficient documentation

## 2016-08-23 DIAGNOSIS — G5602 Carpal tunnel syndrome, left upper limb: Secondary | ICD-10-CM | POA: Diagnosis not present

## 2016-08-23 DIAGNOSIS — Z791 Long term (current) use of non-steroidal anti-inflammatories (NSAID): Secondary | ICD-10-CM | POA: Diagnosis not present

## 2016-08-23 DIAGNOSIS — Z23 Encounter for immunization: Secondary | ICD-10-CM | POA: Diagnosis not present

## 2016-08-23 DIAGNOSIS — Z79899 Other long term (current) drug therapy: Secondary | ICD-10-CM | POA: Diagnosis not present

## 2016-08-23 DIAGNOSIS — Z7983 Long term (current) use of bisphosphonates: Secondary | ICD-10-CM | POA: Diagnosis not present

## 2016-08-29 ENCOUNTER — Ambulatory Visit (HOSPITAL_BASED_OUTPATIENT_CLINIC_OR_DEPARTMENT_OTHER)
Admission: RE | Admit: 2016-08-29 | Discharge: 2016-08-29 | Disposition: A | Discharge status: Home / Self Care | Payer: Commercial Managed Care - HMO | Source: Ambulatory Visit | Attending: Orthopedic Surgery | Admitting: Orthopedic Surgery

## 2016-08-29 ENCOUNTER — Ambulatory Visit (HOSPITAL_BASED_OUTPATIENT_CLINIC_OR_DEPARTMENT_OTHER): Payer: Commercial Managed Care - HMO | Admitting: Anesthesiology

## 2016-08-29 ENCOUNTER — Encounter (HOSPITAL_BASED_OUTPATIENT_CLINIC_OR_DEPARTMENT_OTHER): Payer: Self-pay | Admitting: Certified Registered"

## 2016-08-29 ENCOUNTER — Encounter (HOSPITAL_BASED_OUTPATIENT_CLINIC_OR_DEPARTMENT_OTHER)
Admission: RE | Disposition: A | Discharge status: Home / Self Care | Payer: Self-pay | Source: Ambulatory Visit | Attending: Orthopedic Surgery

## 2016-08-29 DIAGNOSIS — Z791 Long term (current) use of non-steroidal anti-inflammatories (NSAID): Secondary | ICD-10-CM | POA: Insufficient documentation

## 2016-08-29 DIAGNOSIS — Z7983 Long term (current) use of bisphosphonates: Secondary | ICD-10-CM | POA: Insufficient documentation

## 2016-08-29 DIAGNOSIS — Z79899 Other long term (current) drug therapy: Secondary | ICD-10-CM | POA: Insufficient documentation

## 2016-08-29 DIAGNOSIS — G5602 Carpal tunnel syndrome, left upper limb: Secondary | ICD-10-CM | POA: Insufficient documentation

## 2016-08-29 DIAGNOSIS — I10 Essential (primary) hypertension: Secondary | ICD-10-CM | POA: Diagnosis not present

## 2016-08-29 HISTORY — DX: Dental restoration status: Z98.811

## 2016-08-29 HISTORY — PX: CARPAL TUNNEL RELEASE: SHX101

## 2016-08-29 HISTORY — DX: Family history of other specified conditions: Z84.89

## 2016-08-29 HISTORY — DX: Carpal tunnel syndrome, left upper limb: G56.02

## 2016-08-29 HISTORY — DX: Reserved for inherently not codable concepts without codable children: IMO0001

## 2016-08-29 SURGERY — CARPAL TUNNEL RELEASE
Anesthesia: Monitor Anesthesia Care | Site: Arm Lower | Laterality: Left

## 2016-08-29 MED ORDER — SCOPOLAMINE 1 MG/3DAYS TD PT72: 1.0000 | MEDICATED_PATCH | Freq: Once | TRANSDERMAL | Status: DC | PRN

## 2016-08-29 MED ORDER — MIDAZOLAM HCL 2 MG/2ML IJ SOLN: 1.0000 mg | INTRAMUSCULAR | Status: DC | PRN

## 2016-08-29 MED ORDER — BUPIVACAINE HCL (PF) 0.25 % IJ SOLN
INTRAMUSCULAR | Status: DC | PRN
  Administered 2016-08-29: 9 mL

## 2016-08-29 MED ORDER — ONDANSETRON HCL 4 MG/2ML IJ SOLN
INTRAMUSCULAR | Status: AC
  Filled 2016-08-29: qty 2

## 2016-08-29 MED ORDER — ONDANSETRON HCL 4 MG/2ML IJ SOLN
INTRAMUSCULAR | Status: DC | PRN
  Administered 2016-08-29: 4 mg via INTRAVENOUS

## 2016-08-29 MED ORDER — FENTANYL CITRATE (PF) 100 MCG/2ML IJ SOLN: 25.0000 ug | INTRAMUSCULAR | Status: DC | PRN

## 2016-08-29 MED ORDER — VANCOMYCIN HCL IN DEXTROSE 1-5 GM/200ML-% IV SOLN
1000.0000 mg | INTRAVENOUS | Status: AC
  Administered 2016-08-29: 1000 mg via INTRAVENOUS

## 2016-08-29 MED ORDER — GLYCOPYRROLATE 0.2 MG/ML IJ SOLN: 0.2000 mg | Freq: Once | INTRAMUSCULAR | Status: DC | PRN

## 2016-08-29 MED ORDER — ONDANSETRON HCL 4 MG/2ML IJ SOLN: 4.0000 mg | Freq: Once | INTRAMUSCULAR | Status: DC | PRN

## 2016-08-29 MED ORDER — LIDOCAINE HCL (PF) 0.5 % IJ SOLN
INTRAMUSCULAR | Status: DC | PRN
  Administered 2016-08-29: 25 mL via INTRAVENOUS

## 2016-08-29 MED ORDER — VANCOMYCIN HCL IN DEXTROSE 1-5 GM/200ML-% IV SOLN
INTRAVENOUS | Status: AC
  Filled 2016-08-29: qty 200

## 2016-08-29 MED ORDER — PROPOFOL 10 MG/ML IV BOLUS
INTRAVENOUS | Status: DC | PRN
  Administered 2016-08-29 (×2): 10 mg via INTRAVENOUS
  Administered 2016-08-29: 20 mg via INTRAVENOUS
  Administered 2016-08-29 (×3): 10 mg via INTRAVENOUS

## 2016-08-29 MED ORDER — HYDROCODONE-ACETAMINOPHEN 5-325 MG PO TABS: ORAL_TABLET | ORAL | 0 refills | Status: DC

## 2016-08-29 MED ORDER — FENTANYL CITRATE (PF) 100 MCG/2ML IJ SOLN
INTRAMUSCULAR | Status: AC
  Filled 2016-08-29: qty 2

## 2016-08-29 MED ORDER — PROPOFOL 10 MG/ML IV BOLUS
INTRAVENOUS | Status: AC
  Filled 2016-08-29: qty 20

## 2016-08-29 MED ORDER — LIDOCAINE HCL (CARDIAC) 20 MG/ML IV SOLN
INTRAVENOUS | Status: DC | PRN
  Administered 2016-08-29: 20 mg via INTRAVENOUS

## 2016-08-29 MED ORDER — LIDOCAINE HCL (CARDIAC) 20 MG/ML IV SOLN: INTRAVENOUS | Status: DC | PRN

## 2016-08-29 MED ORDER — FENTANYL CITRATE (PF) 100 MCG/2ML IJ SOLN
50.0000 ug | INTRAMUSCULAR | Status: DC | PRN
  Administered 2016-08-29: 25 ug via INTRAVENOUS
  Administered 2016-08-29: 50 ug via INTRAVENOUS

## 2016-08-29 MED ORDER — CHLORHEXIDINE GLUCONATE 4 % EX LIQD: 60.0000 mL | Freq: Once | CUTANEOUS | Status: DC

## 2016-08-29 MED ORDER — LACTATED RINGERS IV SOLN
INTRAVENOUS | Status: DC
  Administered 2016-08-29: 10 mL/h via INTRAVENOUS
  Administered 2016-08-29: 08:00:00 via INTRAVENOUS

## 2016-08-29 SURGICAL SUPPLY — 38 items
BANDAGE ACE 3X5.8 VEL STRL LF (GAUZE/BANDAGES/DRESSINGS) ×3 IMPLANT
BLADE SURG 15 STRL LF DISP TIS (BLADE) ×2 IMPLANT
BLADE SURG 15 STRL SS (BLADE) ×6
BNDG CMPR 9X4 STRL LF SNTH (GAUZE/BANDAGES/DRESSINGS)
BNDG ESMARK 4X9 LF (GAUZE/BANDAGES/DRESSINGS) IMPLANT
BNDG GAUZE ELAST 4 BULKY (GAUZE/BANDAGES/DRESSINGS) ×3 IMPLANT
CHLORAPREP W/TINT 26ML (MISCELLANEOUS) ×3 IMPLANT
CORDS BIPOLAR (ELECTRODE) ×3 IMPLANT
COVER BACK TABLE 60X90IN (DRAPES) ×3 IMPLANT
COVER MAYO STAND STRL (DRAPES) ×3 IMPLANT
CUFF TOURNIQUET SINGLE 18IN (TOURNIQUET CUFF) ×3 IMPLANT
DRAPE EXTREMITY T 121X128X90 (DRAPE) ×3 IMPLANT
DRAPE SURG 17X23 STRL (DRAPES) ×3 IMPLANT
DRSG PAD ABDOMINAL 8X10 ST (GAUZE/BANDAGES/DRESSINGS) ×3 IMPLANT
GAUZE SPONGE 4X4 12PLY STRL (GAUZE/BANDAGES/DRESSINGS) ×3 IMPLANT
GAUZE XEROFORM 1X8 LF (GAUZE/BANDAGES/DRESSINGS) ×3 IMPLANT
GLOVE BIO SURGEON STRL SZ 6.5 (GLOVE) ×1 IMPLANT
GLOVE BIO SURGEON STRL SZ7.5 (GLOVE) ×3 IMPLANT
GLOVE BIO SURGEONS STRL SZ 6.5 (GLOVE) ×1
GLOVE BIOGEL PI IND STRL 7.0 (GLOVE) IMPLANT
GLOVE BIOGEL PI IND STRL 8 (GLOVE) ×1 IMPLANT
GLOVE BIOGEL PI INDICATOR 7.0 (GLOVE) ×2
GLOVE BIOGEL PI INDICATOR 8 (GLOVE) ×2
GOWN STRL REUS W/ TWL LRG LVL3 (GOWN DISPOSABLE) ×1 IMPLANT
GOWN STRL REUS W/TWL LRG LVL3 (GOWN DISPOSABLE) ×3
GOWN STRL REUS W/TWL XL LVL3 (GOWN DISPOSABLE) ×3 IMPLANT
NDL HYPO 25X1 1.5 SAFETY (NEEDLE) ×1 IMPLANT
NEEDLE HYPO 25X1 1.5 SAFETY (NEEDLE) ×3 IMPLANT
NS IRRIG 1000ML POUR BTL (IV SOLUTION) ×3 IMPLANT
PACK BASIN DAY SURGERY FS (CUSTOM PROCEDURE TRAY) ×3 IMPLANT
PADDING CAST ABS 4INX4YD NS (CAST SUPPLIES) ×2
PADDING CAST ABS COTTON 4X4 ST (CAST SUPPLIES) ×1 IMPLANT
STOCKINETTE 4X48 STRL (DRAPES) ×3 IMPLANT
SUT ETHILON 4 0 PS 2 18 (SUTURE) ×3 IMPLANT
SYR BULB 3OZ (MISCELLANEOUS) ×3 IMPLANT
SYR CONTROL 10ML LL (SYRINGE) ×3 IMPLANT
TOWEL OR 17X24 6PK STRL BLUE (TOWEL DISPOSABLE) ×6 IMPLANT
UNDERPAD 30X30 (UNDERPADS AND DIAPERS) ×3 IMPLANT

## 2016-08-29 NOTE — Anesthesia Postprocedure Evaluation (Signed)
Anesthesia Post Note  Patient: Laurie Galloway  Procedure(s) Performed: Procedure(s) (LRB): CARPAL TUNNEL RELEASE, left (Left)  Patient location during evaluation: PACU Anesthesia Type: MAC and Bier Block Level of consciousness: awake and alert Pain management: pain level controlled Vital Signs Assessment: post-procedure vital signs reviewed and stable Respiratory status: spontaneous breathing, nonlabored ventilation, respiratory function stable and patient connected to nasal cannula oxygen Cardiovascular status: stable and blood pressure returned to baseline Anesthetic complications: no    Last Vitals:  Vitals:   08/29/16 1045 08/29/16 1127  BP: (!) 173/84 (!) 166/78  Pulse: 72 79  Resp: 16 18  Temp:  36.7 C    Last Pain:  Vitals:   08/29/16 1127  TempSrc:   PainSc: 0-No pain                 Zenaida Deed

## 2016-08-29 NOTE — Transfer of Care (Signed)
Immediate Anesthesia Transfer of Care Note  Patient: Laurie Galloway  Procedure(s) Performed: Procedure(s): CARPAL TUNNEL RELEASE, left (Left)  Patient Location: PACU  Anesthesia Type:MAC and Bier block  Level of Consciousness: awake, alert  and oriented  Airway & Oxygen Therapy: Patient Spontanous Breathing and Patient connected to face mask oxygen  Post-op Assessment: Report given to RN and Post -op Vital signs reviewed and stable  Post vital signs: Reviewed and stable  Last Vitals:  Vitals:   08/29/16 0738  BP: (!) 181/88  Pulse: 74  Resp: 18  Temp: 36.7 C    Last Pain:  Vitals:   08/29/16 0738  TempSrc: Oral      Patients Stated Pain Goal: 0 ([id number])  Complications: No apparent anesthesia complications

## 2016-08-29 NOTE — Op Note (Signed)
08/29/2016 [facility]                              OPERATIVE REPORT   PREOPERATIVE DIAGNOSIS:  Left carpal tunnel syndrome.  POSTOPERATIVE DIAGNOSIS:  Left carpal tunnel syndrome.  PROCEDURE:  Left carpal tunnel release.  SURGEON:  Leanora Cover, MD  ASSISTANT:  none.  ANESTHESIA:  Bier block and sedatin.  IV FLUIDS:  Per anesthesia flow sheet.  ESTIMATED BLOOD LOSS:  Minimal.  COMPLICATIONS:  None.  SPECIMENS:  None.  TOURNIQUET TIME:    Total Tourniquet Time Documented: Forearm (Left) - 38 minutes Total: Forearm (Left) - 38 minutes   DISPOSITION:  Stable to PACU.  LOCATION: [facility]  INDICATIONS:  82 yo female with numbness and tingling and nocturnal symptoms.  Positive nerve conduction studies.   She wishes to have a carpal tunnel release for management of her symptoms.  Risks, benefits and alternatives of surgery were discussed including the risk of blood loss; infection; damage to nerves, vessels, tendons, ligaments, bone; failure of surgery; need for additional surgery; complications with wound healing; continued pain; recurrence of carpal tunnel syndrome; and damage to motor branch. She voiced understanding of these risks and elected to proceed.   OPERATIVE COURSE:  After being identified preoperatively by myself, the patient and I agreed upon the procedure and site of procedure.  The surgical site was marked.  The risks, benefits, and alternatives of the surgery were reviewed and she wished to proceed.  Surgical consent had been signed.  She was given IV vancomycin as preoperative antibiotic prophylaxis.  She was transferred to the operating room and placed on the operating room table in supine position with the Left upper extremity on an armboard.  Bier block and sedation was induced by Anesthesiology.  Left upper extremity was prepped and draped in normal sterile orthopaedic fashion.  A surgical pause was performed between the surgeons,  anesthesia, and operating room staff, and all were in agreement as to the patient, procedure, and site of procedure.  Tourniquet at the proximal aspect of the forearm had been inflated for the Bier block.  Incision was made over the transverse carpal ligament and carried into the subcutaneous tissues by spreading technique.  Bipolar electrocautery was used to obtain hemostasis.  The palmar fascia was sharply incised.  The transverse carpal ligament was identified and sharply incised.  It was incised distally first.  Care was taken to ensure complete decompression distally.  It was then incised proximally.  There was poor visualization of the distal antebrachial fascia, therefore the incision was extended into the distal forearm and the antebrachial fascia released.  The nerve was nearly subcutaneous.  The nerve was examined.  There was an hourglass deformity.  The motor branch was identified and was intact.  The wound was copiously irrigated with sterile saline.  It was then closed with 4-0 nylon in a horizontal mattress fashion.  It was injected with 9 mL of 0.25% plain Marcaine to aid in postoperative analgesia.  It was dressed with sterile Xeroform, 4x4s, an ABD, and wrapped with Kerlix and an Ace bandage.  Tourniquet was deflated at 38 minutes.  Fingertips were pink with brisk capillary refill after deflation of the tourniquet.  Operative drapes were broken down.  The patient was awoken from anesthesia safely.  She was transferred back to stretcher and taken to the PACU in stable condition.  I will see her  back in the office in 1 week for postoperative followup.  I will give her a prescription for Norco 5/325 1-2 po q6 hours prn pain, dispense #20.    Tennis Must, MD Electronically signed, 08/29/16

## 2016-08-29 NOTE — Anesthesia Procedure Notes (Signed)
Procedure Name: MAC Date/Time: 08/29/2016 9:05 AM Performed by: Baxter Flattery Pre-anesthesia Checklist: Patient identified, Emergency Drugs available, Suction available and Patient being monitored Patient Re-evaluated:Patient Re-evaluated prior to inductionOxygen Delivery Method: Simple face mask Preoxygenation: Pre-oxygenation with 100% oxygen Intubation Type: IV induction Ventilation: Mask ventilation without difficulty Dental Injury: Teeth and Oropharynx as per pre-operative assessment

## 2016-08-29 NOTE — H&P (Signed)
Laurie Galloway is an 82 y.o. female.   Chief Complaint: left carpal tunnel syndrome HPI: 82 yo female with numbness and tingling in fingertips and nocturnal symptoms.  Positive nerve conduction studies.  She wishes to have a carpal tunnel release.  Allergies:  Allergies  Allergen Reactions  . Penicillins Rash  . Sulfa Antibiotics Rash    Past Medical History:  Diagnosis Date  . Arthritis    hands  . Carpal tunnel syndrome of left wrist 08/2016  . Dental crowns present   . Family history of adverse reaction to anesthesia    pt's daughter has hx. of post-op N/V  . Hypertension    states under control with meds., has been on med. x "long time"  . PONV (postoperative nausea and vomiting)   . White coat hypertension     Past Surgical History:  Procedure Laterality Date  . ANTERIOR AND POSTERIOR VAGINAL REPAIR  11/08/2002  . APPENDECTOMY    . BOWEL RESECTION  01/08/2012   Procedure: SMALL BOWEL RESECTION;  Surgeon: Imogene Burn. Georgette Dover, MD;  Location: [facility];  Service: General;  Laterality: N/A;  . CATARACT EXTRACTION W/ INTRAOCULAR LENS IMPLANT Left 01/09/2011  . CATARACT EXTRACTION W/ INTRAOCULAR LENS IMPLANT Right 03/06/2011  . COLONOSCOPY WITH PROPOFOL N/A 08/24/2013   Procedure: COLONOSCOPY WITH PROPOFOL;  Surgeon: Garlan Fair, MD;  Location: [facility];  Service: Endoscopy;  Laterality: N/A;  . ESOPHAGOGASTRODUODENOSCOPY (EGD) WITH PROPOFOL N/A 08/24/2013   Procedure: ESOPHAGOGASTRODUODENOSCOPY (EGD) WITH PROPOFOL;  Surgeon: Garlan Fair, MD;  Location: [facility];  Service: Endoscopy;  Laterality: N/A;  . EXTERNAL EAR SURGERY Bilateral    stapedectomy   . LAPAROSCOPIC ASSISTED VAGINAL HYSTERECTOMY  11/08/2002  . LAPAROSCOPIC BILATERAL SALPINGO OOPHERECTOMY Bilateral 11/08/2002  . LAPAROSCOPIC LYSIS OF ADHESIONS  11/08/2002  . LAPAROTOMY  01/08/2012   Procedure: EXPLORATORY LAPAROTOMY;  Surgeon: Imogene Burn. Georgette Dover, MD;  Location: [facility];  Service: General;  Laterality:  N/A;  . LUMBAR FUSION  03/19/2010   L3-4, L4-5  . LUMBAR LAMINECTOMY/DECOMPRESSION MICRODISCECTOMY  03/19/2010   L3-4, L4-5  . SHOULDER ARTHROSCOPY W/ ROTATOR CUFF REPAIR    . TONSILLECTOMY     age 45    Family History: Family History  Problem Relation Age of Onset  . Anesthesia problems Daughter     post-op N/V    Social History:   reports that she has never smoked. She has never used smokeless tobacco. She reports that she does not drink alcohol or use drugs.  Medications: Medications Prior to Admission  Medication Sig Dispense Refill  . CALCIUM-VITAMIN D PO Take by mouth daily.    . Ergocalciferol (VITAMIN D2) 2000 UNITS TABS Take 1 tablet by mouth daily.    Marland Kitchen losartan (COZAAR) 100 MG tablet Take 100 mg by mouth every morning.     . meloxicam (MOBIC) 7.5 MG tablet Take 7.5 mg by mouth 2 (two) times daily.    . Misc Natural Products (GLUCOSAMINE CHOND MSM FORMULA PO) Take by mouth 2 (two) times daily.    . Misc Natural Products (GLUCOSAMINE CHONDROITIN TRIPLE PO) Take by mouth daily.    . Multiple Vitamin (MULTIVITAMIN) tablet Take 1 tablet by mouth daily.    . Multiple Vitamins-Minerals (PRESERVISION AREDS 2 PO) Take 1 tablet by mouth 2 (two) times daily.    . Omega-3 Fatty Acids (FISH OIL PO) Take 1,000 mg by mouth.    Vladimir Faster Glycol-Propyl Glycol (SYSTANE) 0.4-0.3 % SOLN Place 1 drop into both eyes 4 (  four) times daily.     . verapamil (VERELAN PM) 360 MG 24 hr capsule Take 360 mg by mouth every morning.     Marland Kitchen alendronate (FOSAMAX) 70 MG tablet Take 70 mg by mouth once a week. Take with a full glass of water on an empty stomach.      No results found for this or any previous visit (from the past 48 hour(s)).  No results found.   A comprehensive review of systems was negative.  Blood pressure (!) 181/88, pulse 74, temperature 98 F (36.7 C), temperature source Oral, resp. rate 18, height 5\' 3"  (1.6 m), weight 65.8 kg (145 lb), SpO2 97 %.  General appearance:  alert, cooperative and appears stated age Head: Normocephalic, without obvious abnormality, atraumatic Neck: supple, symmetrical, trachea midline Resp: clear to auscultation bilaterally Cardio: regular rate and rhythm GI: non-tender Extremities: Intact sensation and capillary refill all digits.  +epl/fpl/io.  No wounds.  Pulses: 2+ and symmetric Skin: Skin color, texture, turgor normal. No rashes or lesions Neurologic: Grossly normal Incision/Wound:none  Assessment/Plan Left carpal tunnel syndrome.  Non operative and operative treatment options were discussed with the patient and patient wishes to proceed with operative treatment. Risks, benefits, and alternatives of surgery were discussed and the patient agrees with the plan of care.   Zonia Caplin R 08/29/2016, 8:54 AM

## 2016-08-29 NOTE — Anesthesia Preprocedure Evaluation (Signed)
Anesthesia Evaluation  Patient identified by MRN, date of birth, ID band Patient awake    Reviewed: Allergy & Precautions, H&P , NPO status , Patient's Chart, lab work & pertinent test results  History of Anesthesia Complications (+) PONV and history of anesthetic complications  Airway Mallampati: II  TM Distance: >3 FB Neck ROM: full    Dental no notable dental hx. (+) Teeth Intact, Dental Advisory Given   Pulmonary neg pulmonary ROS,    Pulmonary exam normal breath sounds clear to auscultation       Cardiovascular Exercise Tolerance: Good hypertension, Pt. on medications Normal cardiovascular exam Rhythm:regular Rate:Normal  History tachycardia   Neuro/Psych negative neurological ROS  negative psych ROS   GI/Hepatic negative GI ROS, Neg liver ROS,   Endo/Other  Hyperthyroidism Subclinical hyperthyroidism  Renal/GU negative Renal ROS  negative genitourinary   Musculoskeletal  (+) Arthritis ,   Abdominal   Peds  Hematology negative hematology ROS (+)   Anesthesia Other Findings   Reproductive/Obstetrics negative OB ROS                             Anesthesia Physical  Anesthesia Plan  ASA: II  Anesthesia Plan: MAC and Bier Block   Post-op Pain Management:    Induction: Intravenous  Airway Management Planned: Simple Face Mask  Additional Equipment:   Intra-op Plan:   Post-operative Plan:   Informed Consent: I have reviewed the patients History and Physical, chart, labs and discussed the procedure including the risks, benefits and alternatives for the proposed anesthesia with the patient or authorized representative who has indicated his/her understanding and acceptance.   Dental Advisory Given  Plan Discussed with: CRNA and Surgeon  Anesthesia Plan Comments:         Anesthesia Quick Evaluation

## 2016-08-29 NOTE — Brief Op Note (Signed)
08/29/2016  9:52 AM  PATIENT:  Weldon Picking  82 y.o. female  PRE-OPERATIVE DIAGNOSIS:  left carpal tunnel syndrome  POST-OPERATIVE DIAGNOSIS:  left carpal tunnel syndrome  PROCEDURE:  Procedure(s): CARPAL TUNNEL RELEASE, left (Left)  SURGEON:  Surgeon(s) and Role:    * Leanora Cover, MD - Primary  PHYSICIAN ASSISTANT:   ASSISTANTS: none   ANESTHESIA:   Bier block with sedation  EBL:  Total I/O In: 900 [I.V.:900] Out: -   BLOOD ADMINISTERED:none  DRAINS: none   LOCAL MEDICATIONS USED:  MARCAINE     SPECIMEN:  No Specimen  DISPOSITION OF SPECIMEN:  N/A  COUNTS:  YES  TOURNIQUET:   Total Tourniquet Time Documented: Forearm (Left) - 38 minutes Total: Forearm (Left) - 38 minutes   DICTATION: .Note written in EPIC  PLAN OF CARE: Discharge to home after PACU  PATIENT DISPOSITION:  PACU - hemodynamically stable.

## 2016-08-29 NOTE — Anesthesia Procedure Notes (Signed)
Performed by: Ishan Sanroman W       

## 2016-08-29 NOTE — Discharge Instructions (Addendum)

## 2016-08-30 ENCOUNTER — Encounter (HOSPITAL_BASED_OUTPATIENT_CLINIC_OR_DEPARTMENT_OTHER): Payer: Self-pay | Admitting: Orthopedic Surgery

## 2016-09-16 DIAGNOSIS — M79642 Pain in left hand: Secondary | ICD-10-CM | POA: Diagnosis not present

## 2016-09-16 DIAGNOSIS — L980 Pyogenic granuloma: Secondary | ICD-10-CM | POA: Diagnosis not present

## 2016-09-18 DIAGNOSIS — H353221 Exudative age-related macular degeneration, left eye, with active choroidal neovascularization: Secondary | ICD-10-CM | POA: Diagnosis not present

## 2016-09-18 DIAGNOSIS — H35421 Microcystoid degeneration of retina, right eye: Secondary | ICD-10-CM | POA: Diagnosis not present

## 2016-09-18 DIAGNOSIS — H353113 Nonexudative age-related macular degeneration, right eye, advanced atrophic without subfoveal involvement: Secondary | ICD-10-CM | POA: Diagnosis not present

## 2016-09-18 DIAGNOSIS — H43813 Vitreous degeneration, bilateral: Secondary | ICD-10-CM | POA: Diagnosis not present

## 2016-09-23 DIAGNOSIS — L980 Pyogenic granuloma: Secondary | ICD-10-CM | POA: Diagnosis not present

## 2016-09-23 DIAGNOSIS — G5603 Carpal tunnel syndrome, bilateral upper limbs: Secondary | ICD-10-CM | POA: Diagnosis not present

## 2016-09-27 DIAGNOSIS — L980 Pyogenic granuloma: Secondary | ICD-10-CM | POA: Diagnosis not present

## 2016-09-27 DIAGNOSIS — M79642 Pain in left hand: Secondary | ICD-10-CM | POA: Diagnosis not present

## 2016-12-25 DIAGNOSIS — I8393 Asymptomatic varicose veins of bilateral lower extremities: Secondary | ICD-10-CM | POA: Diagnosis not present

## 2016-12-25 DIAGNOSIS — M150 Primary generalized (osteo)arthritis: Secondary | ICD-10-CM | POA: Diagnosis not present

## 2016-12-25 DIAGNOSIS — J301 Allergic rhinitis due to pollen: Secondary | ICD-10-CM | POA: Diagnosis not present

## 2016-12-25 DIAGNOSIS — D692 Other nonthrombocytopenic purpura: Secondary | ICD-10-CM | POA: Diagnosis not present

## 2016-12-25 DIAGNOSIS — Z0000 Encounter for general adult medical examination without abnormal findings: Secondary | ICD-10-CM | POA: Diagnosis not present

## 2016-12-25 DIAGNOSIS — I10 Essential (primary) hypertension: Secondary | ICD-10-CM | POA: Diagnosis not present

## 2016-12-25 DIAGNOSIS — E782 Mixed hyperlipidemia: Secondary | ICD-10-CM | POA: Diagnosis not present

## 2016-12-25 DIAGNOSIS — R911 Solitary pulmonary nodule: Secondary | ICD-10-CM | POA: Diagnosis not present

## 2016-12-25 DIAGNOSIS — M810 Age-related osteoporosis without current pathological fracture: Secondary | ICD-10-CM | POA: Diagnosis not present

## 2017-01-16 DIAGNOSIS — K006 Disturbances in tooth eruption: Secondary | ICD-10-CM | POA: Diagnosis not present

## 2017-01-21 DIAGNOSIS — H353112 Nonexudative age-related macular degeneration, right eye, intermediate dry stage: Secondary | ICD-10-CM | POA: Diagnosis not present

## 2017-01-21 DIAGNOSIS — H43813 Vitreous degeneration, bilateral: Secondary | ICD-10-CM | POA: Diagnosis not present

## 2017-01-21 DIAGNOSIS — H353222 Exudative age-related macular degeneration, left eye, with inactive choroidal neovascularization: Secondary | ICD-10-CM | POA: Diagnosis not present

## 2017-01-21 DIAGNOSIS — H35421 Microcystoid degeneration of retina, right eye: Secondary | ICD-10-CM | POA: Diagnosis not present

## 2017-01-24 DIAGNOSIS — R69 Illness, unspecified: Secondary | ICD-10-CM | POA: Diagnosis not present

## 2017-01-27 DIAGNOSIS — M8588 Other specified disorders of bone density and structure, other site: Secondary | ICD-10-CM | POA: Diagnosis not present

## 2017-01-27 DIAGNOSIS — Z780 Asymptomatic menopausal state: Secondary | ICD-10-CM | POA: Diagnosis not present

## 2017-02-25 ENCOUNTER — Other Ambulatory Visit: Payer: Self-pay | Admitting: Family Medicine

## 2017-02-25 DIAGNOSIS — Z1231 Encounter for screening mammogram for malignant neoplasm of breast: Secondary | ICD-10-CM

## 2017-04-08 ENCOUNTER — Ambulatory Visit
Admission: RE | Admit: 2017-04-08 | Discharge: 2017-04-08 | Disposition: A | Discharge status: Home / Self Care | Payer: Commercial Managed Care - HMO | Source: Ambulatory Visit | Attending: Family Medicine | Admitting: Family Medicine

## 2017-04-08 DIAGNOSIS — Z1231 Encounter for screening mammogram for malignant neoplasm of breast: Secondary | ICD-10-CM | POA: Diagnosis not present

## 2017-04-23 DIAGNOSIS — H353112 Nonexudative age-related macular degeneration, right eye, intermediate dry stage: Secondary | ICD-10-CM | POA: Diagnosis not present

## 2017-04-23 DIAGNOSIS — H353222 Exudative age-related macular degeneration, left eye, with inactive choroidal neovascularization: Secondary | ICD-10-CM | POA: Diagnosis not present

## 2017-08-25 DIAGNOSIS — Z23 Encounter for immunization: Secondary | ICD-10-CM | POA: Diagnosis not present

## 2017-09-22 DIAGNOSIS — R300 Dysuria: Secondary | ICD-10-CM | POA: Diagnosis not present

## 2017-10-13 ENCOUNTER — Emergency Department (HOSPITAL_COMMUNITY): Payer: Medicare HMO

## 2017-10-13 ENCOUNTER — Encounter (HOSPITAL_COMMUNITY): Payer: Self-pay | Admitting: Internal Medicine

## 2017-10-13 ENCOUNTER — Inpatient Hospital Stay (HOSPITAL_COMMUNITY)
Admission: EM | Admit: 2017-10-13 | Discharge: 2017-10-17 | DRG: 482 | Disposition: A | Discharge status: Skilled Nursing Facility | Payer: Medicare HMO | Attending: Internal Medicine | Admitting: Internal Medicine

## 2017-10-13 ENCOUNTER — Other Ambulatory Visit: Payer: Self-pay

## 2017-10-13 DIAGNOSIS — W010XXA Fall on same level from slipping, tripping and stumbling without subsequent striking against object, initial encounter: Secondary | ICD-10-CM | POA: Diagnosis not present

## 2017-10-13 DIAGNOSIS — R278 Other lack of coordination: Secondary | ICD-10-CM | POA: Diagnosis not present

## 2017-10-13 DIAGNOSIS — I10 Essential (primary) hypertension: Secondary | ICD-10-CM | POA: Diagnosis present

## 2017-10-13 DIAGNOSIS — S72009A Fracture of unspecified part of neck of unspecified femur, initial encounter for closed fracture: Secondary | ICD-10-CM | POA: Diagnosis not present

## 2017-10-13 DIAGNOSIS — Z9841 Cataract extraction status, right eye: Secondary | ICD-10-CM | POA: Diagnosis not present

## 2017-10-13 DIAGNOSIS — S8991XA Unspecified injury of right lower leg, initial encounter: Secondary | ICD-10-CM | POA: Diagnosis not present

## 2017-10-13 DIAGNOSIS — Z9621 Cochlear implant status: Secondary | ICD-10-CM | POA: Diagnosis not present

## 2017-10-13 DIAGNOSIS — T148XXA Other injury of unspecified body region, initial encounter: Secondary | ICD-10-CM | POA: Diagnosis not present

## 2017-10-13 DIAGNOSIS — Z981 Arthrodesis status: Secondary | ICD-10-CM | POA: Diagnosis not present

## 2017-10-13 DIAGNOSIS — Z961 Presence of intraocular lens: Secondary | ICD-10-CM | POA: Diagnosis present

## 2017-10-13 DIAGNOSIS — Z9842 Cataract extraction status, left eye: Secondary | ICD-10-CM | POA: Diagnosis not present

## 2017-10-13 DIAGNOSIS — Z79899 Other long term (current) drug therapy: Secondary | ICD-10-CM | POA: Diagnosis not present

## 2017-10-13 DIAGNOSIS — M25552 Pain in left hip: Secondary | ICD-10-CM | POA: Diagnosis not present

## 2017-10-13 DIAGNOSIS — S72002A Fracture of unspecified part of neck of left femur, initial encounter for closed fracture: Secondary | ICD-10-CM | POA: Diagnosis not present

## 2017-10-13 DIAGNOSIS — E0590 Thyrotoxicosis, unspecified without thyrotoxic crisis or storm: Secondary | ICD-10-CM | POA: Diagnosis not present

## 2017-10-13 DIAGNOSIS — K5900 Constipation, unspecified: Secondary | ICD-10-CM | POA: Diagnosis not present

## 2017-10-13 DIAGNOSIS — Z66 Do not resuscitate: Secondary | ICD-10-CM | POA: Diagnosis not present

## 2017-10-13 DIAGNOSIS — R627 Adult failure to thrive: Secondary | ICD-10-CM | POA: Diagnosis not present

## 2017-10-13 DIAGNOSIS — G8929 Other chronic pain: Secondary | ICD-10-CM | POA: Diagnosis not present

## 2017-10-13 DIAGNOSIS — K5909 Other constipation: Secondary | ICD-10-CM | POA: Diagnosis not present

## 2017-10-13 DIAGNOSIS — S72142A Displaced intertrochanteric fracture of left femur, initial encounter for closed fracture: Principal | ICD-10-CM | POA: Diagnosis present

## 2017-10-13 DIAGNOSIS — Y92009 Unspecified place in unspecified non-institutional (private) residence as the place of occurrence of the external cause: Secondary | ICD-10-CM | POA: Diagnosis not present

## 2017-10-13 DIAGNOSIS — E039 Hypothyroidism, unspecified: Secondary | ICD-10-CM | POA: Diagnosis present

## 2017-10-13 DIAGNOSIS — W109XXA Fall (on) (from) unspecified stairs and steps, initial encounter: Secondary | ICD-10-CM | POA: Diagnosis present

## 2017-10-13 DIAGNOSIS — M6281 Muscle weakness (generalized): Secondary | ICD-10-CM | POA: Diagnosis not present

## 2017-10-13 DIAGNOSIS — S72002D Fracture of unspecified part of neck of left femur, subsequent encounter for closed fracture with routine healing: Secondary | ICD-10-CM | POA: Diagnosis not present

## 2017-10-13 DIAGNOSIS — R2681 Unsteadiness on feet: Secondary | ICD-10-CM | POA: Diagnosis not present

## 2017-10-13 DIAGNOSIS — Z90710 Acquired absence of both cervix and uterus: Secondary | ICD-10-CM

## 2017-10-13 DIAGNOSIS — M62838 Other muscle spasm: Secondary | ICD-10-CM | POA: Diagnosis not present

## 2017-10-13 DIAGNOSIS — Z01818 Encounter for other preprocedural examination: Secondary | ICD-10-CM | POA: Diagnosis not present

## 2017-10-13 DIAGNOSIS — R41841 Cognitive communication deficit: Secondary | ICD-10-CM | POA: Diagnosis not present

## 2017-10-13 HISTORY — DX: Cochlear implant status: Z96.21

## 2017-10-13 HISTORY — DX: Fracture of unspecified part of neck of left femur, initial encounter for closed fracture: S72.002A

## 2017-10-13 LAB — CBC WITH DIFFERENTIAL/PLATELET
Basophils Absolute: 0 10*3/uL (ref 0.0–0.1)
Basophils Relative: 0 %
Eosinophils Absolute: 0.2 10*3/uL (ref 0.0–0.7)
Eosinophils Relative: 1 %
HCT: 41.1 % (ref 36.0–46.0)
Hemoglobin: 13.7 g/dL (ref 12.0–15.0)
Lymphocytes Relative: 10 %
Lymphs Abs: 1 10*3/uL (ref 0.7–4.0)
MCH: 31.2 pg (ref 26.0–34.0)
MCHC: 33.3 g/dL (ref 30.0–36.0)
MCV: 93.6 fL (ref 78.0–100.0)
Monocytes Absolute: 0.9 10*3/uL (ref 0.1–1.0)
Monocytes Relative: 8 %
Neutro Abs: 8.8 10*3/uL — ABNORMAL HIGH (ref 1.7–7.7)
Neutrophils Relative %: 81 %
Platelets: 335 10*3/uL (ref 150–400)
RBC: 4.39 MIL/uL (ref 3.87–5.11)
RDW: 14.1 % (ref 11.5–15.5)
WBC: 10.8 10*3/uL — ABNORMAL HIGH (ref 4.0–10.5)

## 2017-10-13 LAB — BASIC METABOLIC PANEL
Anion gap: 9 (ref 5–15)
BUN: 21 mg/dL — ABNORMAL HIGH (ref 6–20)
CO2: 27 mmol/L (ref 22–32)
Calcium: 9.4 mg/dL (ref 8.9–10.3)
Chloride: 103 mmol/L (ref 101–111)
Creatinine, Ser: 0.75 mg/dL (ref 0.44–1.00)
GFR calc Af Amer: >60 mL/min (ref >60)
GFR calc non Af Amer: >60 mL/min (ref >60)
Glucose, Bld: 118 mg/dL — ABNORMAL HIGH (ref 65–99)
Potassium: 4 mmol/L (ref 3.5–5.1)
Sodium: 139 mmol/L (ref 135–145)

## 2017-10-13 LAB — TYPE AND SCREEN
ABO/RH(D): O POS
Antibody Screen: NEGATIVE

## 2017-10-13 LAB — PROTIME-INR
INR: 1
Prothrombin Time: 13.1 seconds (ref 11.4–15.2)

## 2017-10-13 MED ORDER — MORPHINE SULFATE (PF) 4 MG/ML IV SOLN
1.0000 mg | INTRAVENOUS | Status: DC | PRN
  Administered 2017-10-14 (×2): 2 mg via INTRAVENOUS
  Filled 2017-10-13 (×2): qty 1

## 2017-10-13 MED ORDER — POLYETHYLENE GLYCOL 3350 17 G PO PACK: 17.0000 g | PACK | Freq: Every day | ORAL | Status: DC | PRN

## 2017-10-13 MED ORDER — DEXTROSE 5 % IV SOLN
500.0000 mg | Freq: Four times a day (QID) | INTRAVENOUS | Status: DC | PRN
  Filled 2017-10-13: qty 5

## 2017-10-13 MED ORDER — HYDROCODONE-ACETAMINOPHEN 5-325 MG PO TABS
1.0000 | ORAL_TABLET | Freq: Four times a day (QID) | ORAL | Status: DC | PRN
  Administered 2017-10-15 – 2017-10-17 (×9): 1 via ORAL
  Administered 2017-10-17 (×2): 2 via ORAL
  Filled 2017-10-13 (×9): qty 1
  Filled 2017-10-13: qty 2
  Filled 2017-10-13: qty 1
  Filled 2017-10-13: qty 2

## 2017-10-13 MED ORDER — MORPHINE SULFATE (PF) 4 MG/ML IV SOLN
4.0000 mg | INTRAVENOUS | Status: DC | PRN
  Administered 2017-10-13 (×2): 4 mg via INTRAVENOUS
  Filled 2017-10-13 (×2): qty 1

## 2017-10-13 MED ORDER — VERAPAMIL HCL ER 180 MG PO TBCR
360.0000 mg | EXTENDED_RELEASE_TABLET | Freq: Every morning | ORAL | Status: DC
  Administered 2017-10-15 – 2017-10-17 (×3): 360 mg via ORAL
  Filled 2017-10-13 (×3): qty 2

## 2017-10-13 MED ORDER — ONDANSETRON HCL 4 MG/2ML IJ SOLN
4.0000 mg | Freq: Once | INTRAMUSCULAR | Status: AC
  Administered 2017-10-13: 4 mg via INTRAVENOUS
  Filled 2017-10-13: qty 2

## 2017-10-13 MED ORDER — LOSARTAN POTASSIUM 50 MG PO TABS
100.0000 mg | ORAL_TABLET | Freq: Every morning | ORAL | Status: DC
  Administered 2017-10-15 – 2017-10-17 (×3): 100 mg via ORAL
  Filled 2017-10-13 (×3): qty 2

## 2017-10-13 MED ORDER — SODIUM CHLORIDE 0.9 % IV SOLN
Freq: Once | INTRAVENOUS | Status: AC
  Administered 2017-10-13: 17:00:00 via INTRAVENOUS

## 2017-10-13 MED ORDER — METHOCARBAMOL 500 MG PO TABS
500.0000 mg | ORAL_TABLET | Freq: Four times a day (QID) | ORAL | Status: DC | PRN
  Administered 2017-10-15 – 2017-10-17 (×8): 500 mg via ORAL
  Filled 2017-10-13 (×9): qty 1

## 2017-10-13 NOTE — Clinical Social Work Note (Signed)
Clinical Social Work Assessment  Patient Details  Name: Laurie Galloway MRN: [id number] Date of Birth: 15-Apr-1930  Date of referral:  10/13/17               Reason for consult:  Facility Placement                Permission sought to share information with:  Chartered certified accountant granted to share information::  Yes, Verbal Permission Granted  Name::        Agency::     Relationship::     Contact Information:     Housing/Transportation Living arrangements for the past 2 months:  Single Family Home Source of Information:  Patient Patient Interpreter Needed:  None Criminal Activity/Legal Involvement Pertinent to Current Situation/Hospitalization:    Significant Relationships:  Adult Children Lives with:  Adult Children(Lives with a special needs child but has another supportive daughter) Do you feel safe going back to the place where you live?  Yes Need for family participation in patient care:  No (Coment)  Care giving concerns:  Pt deeply desires to return home, partially due to her special needs adult daughter requiring her care.  Pt has another fully supportive adult daughter who is advocating for the pt to D/C to a SNF and for the mother to seek admission and find out who has open SNF beds.  Pt at this time is open to seek admission but desires to decide once treatment progresses and pt knows more about what she is or is not capable of doing at home with [facility].    Social Worker assessment / plan:  CSW met with pt and confirmed pt's plan to seek admission to a SNF and "find out who has open SNF beds".  CSW provided active listening and validated pt's concerns and pt's daughter's concerns.   CSW DEPT was f=given verbal permission to complete FL-2 and send referrals out to SNF facilities via the hub per pt's request.  Pt still wants to make final decision at a later time regarding D/C.  Pt has been living independently prior to being admitted to [facility].  CSW educated pt and  pt's daughter that in the past others have gone home rather than to a SNF for rehab, realized they could not perform ADL's with their limited family support, then returned to the ED and found transition to a SNF to be very difficult process and at times impossible, but pt stated she desired to make that decision at a later date.  CSW validated that the decision is the pt's decision.  Pt's supportive adult daughter want pt to go to a SNF.  Employment status:  Retired Nurse, adult PT Recommendations:  Not assessed at this time Information / Referral to community resources:     Patient/Family's Response to care:  Patient alert and oriented.  Patient and pt's daughter agreeable to plan.  Pt's daughter supportive and strongly involved in pt.'s care.  Pt and pt's daughter pleasant and appreciated CSW intervention.    Patient/Family's Understanding of and Emotional Response to Diagnosis, Current Treatment, and Prognosis:  Pt and family understand current prognosis and treatment.   Emotional Assessment Appearance:  Appears stated age Attitude/Demeanor/Rapport:    Affect (typically observed):  Adaptable, Accepting, Appropriate, Calm Orientation:  Oriented to Situation, Oriented to  Time, Oriented to Place, Oriented to Self Alcohol / Substance use:    Psych involvement (Current and /or in the community):     Discharge Needs  Concerns to be addressed:  Childcare Concerns Readmission within the last 30 days:  No Current discharge risk:  None Barriers to Discharge:  No Barriers Identified   Claudine Mouton, LCSWA 10/13/2017, 8:20 PM

## 2017-10-13 NOTE — ED Notes (Signed)
Bed: WA18 Expected date:  Expected time:  Means of arrival:  Comments: EMS-hip pain 

## 2017-10-13 NOTE — Progress Notes (Signed)
Report received from [place]. Pt received to room 1608 via stretcher and transferred to bed and aligned for comfort. Call [place] for safety education initiated. Pt verbalized understanding.

## 2017-10-13 NOTE — ED Triage Notes (Addendum)
Per EMS, pt is coming from home after experiencing a fall. Pt reports losing her balance and denies hitting her head. Pt denies taking any anticoagulants. Pt usually ambulatory with no assistive devices. Pt AO x4. Pt received 50 mcg fentanyl prior to arrival.

## 2017-10-13 NOTE — ED Provider Notes (Signed)
Emergency Department Provider Note   I have reviewed the triage vital signs and the nursing notes.   HISTORY  Chief Complaint Hip Pain (left)   HPI Laurie Galloway is a 83 y.o. female with PMH of carpal tunnel syndrome and HTN presents to the emergency department for evaluation after mechanical fall at home.  The patient was stepping down one step when she lost her balance and fell to the side.  She describes excruciating pain in the left hip.  She denies any head trauma or loss of consciousness.  She lives at home with her special needs daughter.  She was able to call her other daughter who ultimately called EMS.  She denies any numbness or tingling in the extremity.  Last PO intake was noon. No nausea, vomiting, or confusion since the fall.    Past Medical History:  Diagnosis Date  . Arthritis    hands  . Carpal tunnel syndrome of left wrist 08/2016  . Dental crowns present   . Family history of adverse reaction to anesthesia    pt's daughter has hx. of post-op N/V  . Hypertension    states under control with meds., has been on med. x "long time"  . PONV (postoperative nausea and vomiting)   . White coat hypertension     Patient Active Problem List   Diagnosis Date Noted  . Closed left hip fracture, initial encounter ([place]) 10/13/2017  . Hip fracture ([place]) 10/13/2017  . Cochlear implant in place 10/13/2017  . Subclinical hyperthyroidism 06/18/2013  . Small bowel obstruction, partial ([place]) 04/01/2012  . Small bowel obstruction ([place]) 01/07/2012  . HTN (hypertension) 01/07/2012  . Tachycardia 01/07/2012  . Gastrointestinal bleeding 01/07/2012  . Chronic back pain 01/07/2012  . Epigastric pain 01/07/2012    Past Surgical History:  Procedure Laterality Date  . ANTERIOR AND POSTERIOR VAGINAL REPAIR  11/08/2002  . APPENDECTOMY    . CATARACT EXTRACTION W/ INTRAOCULAR LENS IMPLANT Left 01/09/2011  . CATARACT EXTRACTION W/ INTRAOCULAR LENS IMPLANT Right 03/06/2011  .  EXTERNAL EAR SURGERY Bilateral    stapedectomy   . LAPAROSCOPIC ASSISTED VAGINAL HYSTERECTOMY  11/08/2002  . LAPAROSCOPIC BILATERAL SALPINGO OOPHERECTOMY Bilateral 11/08/2002  . LAPAROSCOPIC LYSIS OF ADHESIONS  11/08/2002  . LUMBAR FUSION  03/19/2010   L3-4, L4-5  . LUMBAR LAMINECTOMY/DECOMPRESSION MICRODISCECTOMY  03/19/2010   L3-4, L4-5  . SHOULDER ARTHROSCOPY W/ ROTATOR CUFF REPAIR    . TONSILLECTOMY     age 72      Allergies Penicillins; Sulfa antibiotics; and Sulfacetamide sodium  Family History  Problem Relation Age of Onset  . Anesthesia problems Daughter        post-op N/V    Social History Social History   Tobacco Use  . Smoking status: Never Smoker  . Smokeless tobacco: Never Used  Substance Use Topics  . Alcohol use: No  . Drug use: No    Review of Systems  Constitutional: No fever/chills Eyes: No visual changes. ENT: No sore throat. Cardiovascular: Denies chest pain. Respiratory: Denies shortness of breath. Gastrointestinal: No abdominal pain.  No nausea, no vomiting.  No diarrhea.  No constipation. Genitourinary: Negative for dysuria. Musculoskeletal: Negative for back pain. Positive left hip pain.  Skin: Negative for rash. Neurological: Negative for headaches, focal weakness or numbness.  10-point ROS otherwise negative.  ____________________________________________   PHYSICAL EXAM:  VITAL SIGNS: ED Triage Vitals  Enc Vitals Group     BP 10/13/17 1532 (!) 182/150     Pulse Rate  10/13/17 1532 83     Resp 10/13/17 1532 18     Temp 10/13/17 1532 97.9 F (36.6 C)     Temp Source 10/13/17 1532 Oral     SpO2 10/13/17 1530 97 %   Constitutional: Alert and oriented. Well appearing and in no acute distress. Eyes: Conjunctivae are normal. PERRL.  Head: Atraumatic. Nose: No congestion/rhinnorhea. Mouth/Throat: Mucous membranes are moist.  Oropharynx non-erythematous. Neck: No stridor.  No cervical spine tenderness to  palpation. Cardiovascular: Normal rate, regular rhythm. Good peripheral circulation. Grossly normal heart sounds.   Respiratory: Normal respiratory effort.  No retractions. Lungs CTAB. Gastrointestinal: Soft and nontender. No distention.  Musculoskeletal: Exquisite left hip tenderness with any movement. Mild left knee pain without bruising or effusion. Normal pulses and sensation in the B/L LEs.  Neurologic:  Normal speech and language. No gross focal neurologic deficits are appreciated.  Skin:  Skin is warm, dry and intact. No rash noted.   ____________________________________________   LABS (all labs ordered are listed, but only abnormal results are displayed)  Labs Reviewed  BASIC METABOLIC PANEL - Abnormal; Notable for the following components:      Result Value   Glucose, Bld 118 (*)    BUN 21 (*)    All other components within normal limits  CBC WITH DIFFERENTIAL/PLATELET - Abnormal; Notable for the following components:   WBC 10.8 (*)    Neutro Abs 8.8 (*)    All other components within normal limits  PROTIME-INR  CBC  BASIC METABOLIC PANEL  ALBUMIN  VITAMIN D 25 HYDROXY (VIT D DEFICIENCY, FRACTURES)  TYPE AND SCREEN   ____________________________________________  EKG   EKG Interpretation  Date/Time:  Monday October 13 2017 16:09:45 EST Ventricular Rate:  88 PR Interval:    QRS Duration: 101 QT Interval:  389 QTC Calculation: 471 R Axis:   75 Text Interpretation:  Sinus rhythm Ventricular premature complex Prolonged PR interval Right atrial enlargement No STEMI.  Confirmed by Nanda Quinton [id number]) on 10/13/2017 4:30:27 PM       ____________________________________________  RADIOLOGY  Dg Chest 1 View  Result Date: 10/13/2017 CLINICAL DATA:  Preoperative exam for left hip fracture. EXAM: CHEST 1 VIEW COMPARISON:  Chest x-ray dated February 18, 2013. FINDINGS: The cardiomediastinal silhouette is normal in size. Normal pulmonary vascularity. No focal  consolidation, pleural effusion, or pneumothorax. No acute osseous abnormality. IMPRESSION: No active disease. Electronically Signed   By: Titus Dubin M.D.   On: 10/13/2017 17:25   Dg Hip Unilat W Or Wo Pelvis 2-3 Views Left  Result Date: 10/13/2017 CLINICAL DATA:  Fall EXAM: DG HIP (WITH OR WITHOUT PELVIS) 2-3V LEFT COMPARISON:  None. FINDINGS: There is an intertrochanteric fracture of the proximal left femur that is comminuted with moderate overriding. The femoral head remains situated in the acetabulum. No pelvic fracture or diastasis. There is at least moderate right hip osteoarthrosis. IMPRESSION: Comminuted intertrochanteric fracture of the left femur without femoroacetabular dislocation. Electronically Signed   By: Ulyses Jarred M.D.   On: 10/13/2017 17:20    ____________________________________________   PROCEDURES  Procedure(s) performed:   Procedures  None ____________________________________________   INITIAL IMPRESSION / ASSESSMENT AND PLAN / ED COURSE  Pertinent labs & imaging results that were available during my care of the patient were reviewed by me and considered in my medical decision making (see chart for details).  Patient presents to the emergency department for evaluation of left hip pain after mechanical fall.  Clinically the patient has a broken  hip.  Will obtain imaging, labs, pain medication.  She has followed with Dr. Fredna Dow and Dr. Annette Stable in the past with orthopedic work.   06:32 PM Spoke with Dr. Alvan Dame with Ortho. Plan for NPO after midnight. Patient can stay here at [facility].  Discussed patient's case with Hospitalist, Dr. Roel Cluck to request admission. Patient and family (if present) updated with plan. Care transferred to Hospitalist service.  I reviewed all nursing notes, vitals, pertinent old records, EKGs, labs, imaging (as available).  ____________________________________________  FINAL CLINICAL IMPRESSION(S) / ED DIAGNOSES  Final  diagnoses:  Closed fracture of left hip, initial encounter ([place])     MEDICATIONS GIVEN DURING THIS VISIT:  Medications  losartan (COZAAR) tablet 100 mg (not administered)  verapamil (CALAN-SR) CR tablet 360 mg (not administered)  HYDROcodone-acetaminophen (NORCO/VICODIN) 5-325 MG per tablet 1-2 tablet (not administered)  morphine 4 MG/ML injection 1-2 mg (not administered)  methocarbamol (ROBAXIN) tablet 500 mg (not administered)    Or  methocarbamol (ROBAXIN) 500 mg in dextrose 5 % 50 mL IVPB (not administered)  polyethylene glycol (MIRALAX / GLYCOLAX) packet 17 g (not administered)  0.9 %  sodium chloride infusion ( Intravenous New Bag/Given 10/13/17 1632)  ondansetron (ZOFRAN) injection 4 mg (4 mg Intravenous Given 10/13/17 1632)    Note:  This document was prepared using Dragon voice recognition software and may include unintentional dictation errors.  Nanda Quinton, MD Emergency Medicine    Long, [facility], MD 10/13/17 [phone]

## 2017-10-13 NOTE — H&P (Signed)
Laurie Galloway:[id number] DOB: Apr 05, 1930 DOA: 10/13/2017     PCP: Mayra Neer, MD   Outpatient Specialists:  Dr. Fredna Dow and Dr. Annette Stable       Patient coming from:    home Lives  With family    Chief Complaint: Left Hip Pain after a fall  HPI: Laurie Galloway is a 83 y.o. female with medical history significant of HTN hypothyroidism  Small bowel obstruction  Presented with a fall that occurred today while walking down the stairs and carrying things in her hands she accidentally missed step and fell down the stairs denies hitting her head. She fell onto her left side and experienced severe pain and was unable to stand up. Patient was able to call her family who called EMS for her. She did water aerobics on regular bases no chest pain or shortness of breath able to walk up a flight of stairs.    Regarding pertinent Chronic problems: History of hypertension Couple tunnel syndrome has been seen last year by [facility]  Dr. Fredna Dow and Dr. Annette Stable in the past.  Denies hx of CAD.   IN ER:  Temp (24hrs), Avg:97.9 F (36.6 C), Min:97.9 F (36.6 C), Max:97.9 F (36.6 C)      on arrival  ED Triage Vitals  Enc Vitals Group     BP 10/13/17 1532 (!) 182/150     Pulse Rate 10/13/17 1532 83     Resp 10/13/17 1532 18     Temp 10/13/17 1532 97.9 F (36.6 C)     Temp Source 10/13/17 1532 Oral     SpO2 10/13/17 1530 97 %     Weight --      Height --      Head Circumference --      Peak Flow --      Pain Score 10/13/17 1835 10     Pain Loc --      Pain Edu? --      Excl. in [place]? --     Latest RR 18 90% BP 136/64 Na 139 K 4.0 BUN 21 Cr 0.75 WBC 10.8 Hg 13.7 PLT 335 INR 1.0 Left Hip: Comminuted intertrochanteric fracture of the left femur  CXR; Non acute Following Medications were ordered in ER: Medications  morphine 4 MG/ML injection 4 mg (4 mg Intravenous Given 10/13/17 1834)  0.9 %  sodium chloride infusion ( Intravenous New Bag/Given 10/13/17 1632)  ondansetron  (ZOFRAN) injection 4 mg (4 mg Intravenous Given 10/13/17 1632)     ER provider discussed case with:  Alvan Dame Who recommends: NPO post midnight admit to [facility] for possible OR time tomorrow We'll see patient in consult in the morning    Hospitalist was called for admission for Left hip frx due to mechanical fall  Review of Systems:    Pertinent positives include: left hip pain  Constitutional:  No weight loss, night sweats, Fevers, chills, fatigue, weight loss  HEENT:  No headaches, Difficulty swallowing,Tooth/dental problems,Sore throat,  No sneezing, itching, ear ache, nasal congestion, post nasal drip,  Cardio-vascular:  No chest pain, Orthopnea, PND, anasarca, dizziness, palpitations.no Bilateral lower extremity swelling  GI:  No heartburn, indigestion, abdominal pain, nausea, vomiting, diarrhea, change in bowel habits, loss of appetite, melena, blood in stool, hematemesis Resp:  no shortness of breath at rest. No dyspnea on exertion, No excess mucus, no productive cough, No non-productive cough, No coughing up of blood.No change in color of mucus.No wheezing. Skin:  no rash  or lesions. No jaundice GU:  no dysuria, change in color of urine, no urgency or frequency. No straining to urinate.  No flank pain.  Musculoskeletal:  No joint pain or no joint swelling. No decreased range of motion. No back pain.  Psych:  No change in mood or affect. No depression or anxiety. No memory loss.  Neuro: no localizing neurological complaints, no tingling, no weakness, no double vision, no gait abnormality, no slurred speech, no confusion  As per HPI otherwise 10 point review of systems negative.   Past Medical History: Past Medical History:  Diagnosis Date  . Arthritis    hands  . Carpal tunnel syndrome of left wrist 08/2016  . Dental crowns present   . Family history of adverse reaction to anesthesia    pt's daughter has hx. of post-op N/V  . Hypertension    states under control with  meds., has been on med. x "long time"  . PONV (postoperative nausea and vomiting)   . [place] coat hypertension    Past Surgical History:  Procedure Laterality Date  . ANTERIOR AND POSTERIOR VAGINAL REPAIR  11/08/2002  . APPENDECTOMY    . CATARACT EXTRACTION W/ INTRAOCULAR LENS IMPLANT Left 01/09/2011  . CATARACT EXTRACTION W/ INTRAOCULAR LENS IMPLANT Right 03/06/2011  . EXTERNAL EAR SURGERY Bilateral    stapedectomy   . LAPAROSCOPIC ASSISTED VAGINAL HYSTERECTOMY  11/08/2002  . LAPAROSCOPIC BILATERAL SALPINGO OOPHERECTOMY Bilateral 11/08/2002  . LAPAROSCOPIC LYSIS OF ADHESIONS  11/08/2002  . LUMBAR FUSION  03/19/2010   L3-4, L4-5  . LUMBAR LAMINECTOMY/DECOMPRESSION MICRODISCECTOMY  03/19/2010   L3-4, L4-5  . SHOULDER ARTHROSCOPY W/ ROTATOR CUFF REPAIR    . TONSILLECTOMY     age 27     Social History:  Ambulatory independently     reports that  has never smoked. she has never used smokeless tobacco. She reports that she does not drink alcohol or use drugs.  Allergies:   Allergies  Allergen Reactions  . Penicillins Rash  . Sulfa Antibiotics Rash  . Sulfacetamide Sodium Rash       Family History:   Family History  Problem Relation Age of Onset  . Anesthesia problems Daughter        post-op N/V    Medications: Prior to Admission medications   Medication Sig Start Date End Date Taking? Authorizing Provider  alendronate (FOSAMAX) 70 MG tablet Take 70 mg by mouth once a week. Take with a full glass of water on an empty stomach.   Yes [provider]  betamethasone valerate (VALISONE) 0.1 % cream Apply 1 application daily topically. 07/01/13  Yes [provider]  CALCIUM-VITAMIN D PO Take by mouth daily.   Yes [provider]  Ergocalciferol (VITAMIN D2) 2000 UNITS TABS Take 1 tablet by mouth daily.   Yes [provider]  losartan (COZAAR) 100 MG tablet Take 100 mg by mouth every morning.    Yes [provider]  meloxicam  (MOBIC) 7.5 MG tablet Take 7.5 mg by mouth 2 (two) times daily.   Yes [provider]  Misc Natural Products (GLUCOSAMINE CHONDROITIN TRIPLE PO) Take by mouth daily.   Yes [provider]  Multiple Vitamin (MULTIVITAMIN) tablet Take 1 tablet by mouth daily.   Yes [provider]  Multiple Vitamins-Minerals (PRESERVISION AREDS 2 PO) Take 1 tablet by mouth 2 (two) times daily.   Yes [provider]  Omega-3 Fatty Acids (FISH OIL PO) Take 1,000 mg by mouth.   Yes [provider]  Polyethyl Glycol-Propyl Glycol (SYSTANE) 0.4-0.3 % SOLN Place 1 drop into both eyes 4 (four) times daily.    Yes [provider]  verapamil (VERELAN PM) 360 MG 24 hr capsule Take 360 mg by mouth every morning.    Yes [provider]  HYDROcodone-acetaminophen (NORCO) 5-325 MG tablet 1-2 tabs po q6 hours prn pain Patient not taking: Reported on 10/13/2017 08/29/16   Leanora Cover, MD    Physical Exam: Patient Vitals for the past 24 hrs:  BP Temp Temp src Pulse Resp SpO2  10/13/17 1536 (!) 172/148 - - - - -  10/13/17 1532 (!) 182/150 97.9 F (36.6 C) Oral 83 18 97 %  10/13/17 1530 - - - - - 97 %    1. General:  in No Acute distress  well -appearing 2. Psychological: Alert and  Oriented 3. Head/ENT:    Dry Mucous Membranes                          Head Non traumatic, neck supple                         Poor Dentition 4. SKIN:   decreased Skin turgor,  Skin clean Dry and intact no rash 5. Heart: Regular rate and rhythm no  Murmur, no Rub or gallop 6. Lungs:  Clear to auscultation bilaterally, no wheezes or crackles   7. Abdomen: Soft, non-tender, Non distended  bowel sounds present 8. Lower extremities: no clubbing, cyanosis, or edema 9. Neurologically Grossly intact, moving all 4 extremities equally   10. MSK: Normal range of motion limited in left hip   body mass index is unknown because there is no height or weight on file.  Labs on Admission:    Labs on Admission: I have personally reviewed following labs and imaging studies  CBC: Recent Labs  Lab 10/13/17 1636  WBC 10.8*  NEUTROABS 8.8*  HGB 13.7  HCT 41.1  MCV 93.6  PLT 453   Basic Metabolic Panel: Recent Labs  Lab 10/13/17 1636  NA 139  K 4.0  CL 103  CO2 27  GLUCOSE 118*  BUN 21*  CREATININE 0.75  CALCIUM 9.4   GFR: CrCl cannot be calculated (Unknown ideal weight.). Liver Function Tests: No results for input(s): AST, ALT, ALKPHOS, BILITOT, PROT, ALBUMIN in the last 168 hours. No results for input(s): LIPASE, AMYLASE in the last 168 hours. No results for input(s): AMMONIA in the last 168 hours. Coagulation Profile: Recent Labs  Lab 10/13/17 1636  INR 1.00   Cardiac Enzymes: No results for input(s): CKTOTAL, CKMB, CKMBINDEX, TROPONINI in the last 168 hours. BNP (last 3 results) No results for input(s): PROBNP in the last 8760 hours. HbA1C: No results for input(s): HGBA1C in the last 72 hours. CBG: No results for input(s): GLUCAP in the last 168 hours. Lipid Profile: No results for input(s): CHOL, HDL, LDLCALC, TRIG, CHOLHDL, LDLDIRECT in the last 72 hours. Thyroid Function Tests: No results for input(s): TSH, T4TOTAL, FREET4, T3FREE, THYROIDAB in the last 72 hours. Anemia Panel: No results for input(s): VITAMINB12, FOLATE, FERRITIN, TIBC, IRON, RETICCTPCT in the last 72 hours. Urine analysis:  Sepsis Labs: @LABRCNTIP (procalcitonin:4,lacticidven:4) )No results found for this or any previous visit (from the past 240 hour(s)).     UA not ordered  No results found for: HGBA1C  CrCl cannot be calculated (Unknown ideal weight.).  BNP (last 3 results) No results  for input(s): PROBNP in the last 8760 hours.   ECG REPORT  Independently reviewed Rate: 88  Rhythm: NSR ST&T Change: No acute ischemic changes   QTC 471  There were no vitals filed for this visit.   Cultures: No results found for: SDES, [place], CULT, REPTSTATUS    Radiological Exams on Admission: Dg Chest 1 View  Result Date: 10/13/2017 CLINICAL DATA:  Preoperative exam for left hip fracture. EXAM: CHEST 1 VIEW COMPARISON:  Chest x-ray dated February 18, 2013. FINDINGS: The cardiomediastinal silhouette is normal in size. Normal pulmonary vascularity. No focal consolidation, pleural effusion, or pneumothorax. No acute osseous abnormality. IMPRESSION: No active disease. Electronically Signed   By: Titus Dubin M.D.   On: 10/13/2017 17:25   Dg Hip Unilat W Or Wo Pelvis 2-3 Views Left  Result Date: 10/13/2017 CLINICAL DATA:  Fall EXAM: DG HIP (WITH OR WITHOUT PELVIS) 2-3V LEFT COMPARISON:  None. FINDINGS: There is an intertrochanteric fracture of the proximal left femur that is comminuted with moderate overriding. The femoral head remains situated in the acetabulum. No pelvic fracture or diastasis. There is at least moderate right hip osteoarthrosis. IMPRESSION: Comminuted intertrochanteric fracture of the left femur without femoroacetabular dislocation. Electronically Signed   By: Ulyses Jarred M.D.   On: 10/13/2017 17:20    Chart has been reviewed    Assessment/Plan  83 y.o. female with medical history significant of HTN hypothyroidism  Small bowel obstruction  Admitted for Comminuted intertrochanteric fracture of the left femur   Present on Admission: . Closed left hip fracture, initial encounter ([place]) -  - management as per orthopedics,  plan to operate  in  a.m.   Keep nothing by mouth post midnight. Patient  not on anticoagulation or antiplatelet agents   Ordered type and screen, Place Foley, order a vitamin D level  Patient at baseline  able to walk a flight of stairs or 100 feet      Patient denies any chest pain or shortness of breath currently and/or with exertion,   ECG showing no evidence of acute ischemia  no known history of coronary artery disease, COPD Liver failure   CKD  Given advanced age patient is at least moderate  Risk   which has been discussed with family but at this point no furthther cardiac workup is indicated.    Marland Kitchen HTN (hypertension) - stable continue home medications when able to tolerate by mouth.    Constipation - she takes Miralax restart once able to tolerate PO  Other plan as per orders.  DVT prophylaxis:  SCD    Code Status:   DNR/DNI   as per patient    Family Communication:   Family at  Bedside  plan of care was discussed with  Daughters,  Disposition Plan:     likely will need placement for rehabilitation                        Would benefit from PT/OT eval prior to DC      Social Work     consulted                          Consults called: Orthopedics Admission status  inpatient      Level of care      medical floor        I have spent a total of 56 min on this admission   Roshaun Pound 10/13/2017, 8:37  PM    [facility]  Pager [phone]   after 2 AM please page floor coverage PA If 7AM-7PM, please contact the day team taking care of the patient  [email or web address]  Password [email or web address]

## 2017-10-14 ENCOUNTER — Inpatient Hospital Stay (HOSPITAL_COMMUNITY): Payer: Medicare HMO

## 2017-10-14 ENCOUNTER — Inpatient Hospital Stay (HOSPITAL_COMMUNITY): Payer: Medicare HMO | Admitting: Anesthesiology

## 2017-10-14 ENCOUNTER — Encounter (HOSPITAL_COMMUNITY): Payer: Self-pay | Admitting: Anesthesiology

## 2017-10-14 ENCOUNTER — Encounter (HOSPITAL_COMMUNITY)
Admission: EM | Disposition: A | Discharge status: Skilled Nursing Facility | Payer: Self-pay | Source: Home / Self Care | Attending: Internal Medicine

## 2017-10-14 HISTORY — PX: INTRAMEDULLARY (IM) NAIL INTERTROCHANTERIC: SHX5875

## 2017-10-14 LAB — CBC
HCT: 35.8 % — ABNORMAL LOW (ref 36.0–46.0)
Hemoglobin: 11.8 g/dL — ABNORMAL LOW (ref 12.0–15.0)
MCH: 30.7 pg (ref 26.0–34.0)
MCHC: 33 g/dL (ref 30.0–36.0)
MCV: 93.2 fL (ref 78.0–100.0)
Platelets: 329 10*3/uL (ref 150–400)
RBC: 3.84 MIL/uL — ABNORMAL LOW (ref 3.87–5.11)
RDW: 14 % (ref 11.5–15.5)
WBC: 10.4 10*3/uL (ref 4.0–10.5)

## 2017-10-14 LAB — BASIC METABOLIC PANEL
Anion gap: 5 (ref 5–15)
BUN: 23 mg/dL — ABNORMAL HIGH (ref 6–20)
CO2: 28 mmol/L (ref 22–32)
Calcium: 8.5 mg/dL — ABNORMAL LOW (ref 8.9–10.3)
Chloride: 102 mmol/L (ref 101–111)
Creatinine, Ser: 0.67 mg/dL (ref 0.44–1.00)
GFR calc Af Amer: >60 mL/min (ref >60)
GFR calc non Af Amer: >60 mL/min (ref >60)
Glucose, Bld: 141 mg/dL — ABNORMAL HIGH (ref 65–99)
Potassium: 4.3 mmol/L (ref 3.5–5.1)
Sodium: 135 mmol/L (ref 135–145)

## 2017-10-14 LAB — ALBUMIN: Albumin: 3.5 g/dL (ref 3.5–5.0)

## 2017-10-14 SURGERY — FIXATION, FRACTURE, INTERTROCHANTERIC, WITH INTRAMEDULLARY ROD
Anesthesia: General | Site: Hip | Laterality: Left

## 2017-10-14 MED ORDER — PHENYLEPHRINE 40 MCG/ML (10ML) SYRINGE FOR IV PUSH (FOR BLOOD PRESSURE SUPPORT)
PREFILLED_SYRINGE | INTRAVENOUS | Status: DC | PRN
  Administered 2017-10-14: 80 ug via INTRAVENOUS

## 2017-10-14 MED ORDER — CEFAZOLIN SODIUM-DEXTROSE 2-4 GM/100ML-% IV SOLN
2.0000 g | INTRAVENOUS | Status: AC
  Administered 2017-10-14: 2 g via INTRAVENOUS

## 2017-10-14 MED ORDER — CHLORHEXIDINE GLUCONATE 4 % EX LIQD
60.0000 mL | Freq: Once | CUTANEOUS | Status: AC
  Administered 2017-10-14: 4 via TOPICAL

## 2017-10-14 MED ORDER — 0.9 % SODIUM CHLORIDE (POUR BTL) OPTIME
TOPICAL | Status: DC | PRN
  Administered 2017-10-14: 1000 mL

## 2017-10-14 MED ORDER — METOCLOPRAMIDE HCL 5 MG PO TABS
5.0000 mg | ORAL_TABLET | Freq: Three times a day (TID) | ORAL | Status: DC | PRN
Start: 2017-10-14 — End: 2017-10-17

## 2017-10-14 MED ORDER — DOCUSATE SODIUM 100 MG PO CAPS
100.0000 mg | ORAL_CAPSULE | Freq: Two times a day (BID) | ORAL | Status: DC
  Administered 2017-10-14: 100 mg via ORAL
  Filled 2017-10-14: qty 1

## 2017-10-14 MED ORDER — SODIUM CHLORIDE 0.9 % IV SOLN: INTRAVENOUS | Status: DC

## 2017-10-14 MED ORDER — MAGNESIUM CITRATE PO SOLN: 1.0000 | Freq: Once | ORAL | Status: DC | PRN

## 2017-10-14 MED ORDER — ONDANSETRON HCL 4 MG/2ML IJ SOLN
INTRAMUSCULAR | Status: DC | PRN
  Administered 2017-10-14: 4 mg via INTRAVENOUS

## 2017-10-14 MED ORDER — CEFAZOLIN SODIUM-DEXTROSE 2-4 GM/100ML-% IV SOLN
INTRAVENOUS | Status: AC
  Filled 2017-10-14: qty 100

## 2017-10-14 MED ORDER — ONDANSETRON HCL 4 MG PO TABS: 4.0000 mg | ORAL_TABLET | Freq: Four times a day (QID) | ORAL | Status: DC | PRN

## 2017-10-14 MED ORDER — METOPROLOL TARTRATE 5 MG/5ML IV SOLN
5.0000 mg | Freq: Three times a day (TID) | INTRAVENOUS | Status: DC
  Administered 2017-10-14 – 2017-10-15 (×2): 5 mg via INTRAVENOUS
  Filled 2017-10-14 (×4): qty 5

## 2017-10-14 MED ORDER — DEXAMETHASONE SODIUM PHOSPHATE 10 MG/ML IJ SOLN
INTRAMUSCULAR | Status: AC
  Filled 2017-10-14: qty 1

## 2017-10-14 MED ORDER — HYDROCODONE-ACETAMINOPHEN 5-325 MG PO TABS: 1.0000 | ORAL_TABLET | Freq: Four times a day (QID) | ORAL | 0 refills | Status: DC | PRN

## 2017-10-14 MED ORDER — FENTANYL CITRATE (PF) 100 MCG/2ML IJ SOLN
INTRAMUSCULAR | Status: AC
  Filled 2017-10-14: qty 2

## 2017-10-14 MED ORDER — LABETALOL HCL 5 MG/ML IV SOLN
5.0000 mg | INTRAVENOUS | Status: DC | PRN
  Administered 2017-10-14 (×2): 5 mg via INTRAVENOUS

## 2017-10-14 MED ORDER — STERILE WATER FOR IRRIGATION IR SOLN
Status: DC | PRN
  Administered 2017-10-14: 1000 mL

## 2017-10-14 MED ORDER — ASPIRIN EC 325 MG PO TBEC
325.0000 mg | DELAYED_RELEASE_TABLET | Freq: Two times a day (BID) | ORAL | Status: DC
  Administered 2017-10-15 – 2017-10-17 (×5): 325 mg via ORAL
  Filled 2017-10-14 (×5): qty 1

## 2017-10-14 MED ORDER — ASPIRIN 81 MG PO CHEW: 81.0000 mg | CHEWABLE_TABLET | Freq: Two times a day (BID) | ORAL | 0 refills | Status: DC

## 2017-10-14 MED ORDER — LIDOCAINE 2% (20 MG/ML) 5 ML SYRINGE
INTRAMUSCULAR | Status: DC | PRN
  Administered 2017-10-14: 80 mg via INTRAVENOUS

## 2017-10-14 MED ORDER — LACTATED RINGERS IV SOLN
INTRAVENOUS | Status: DC | PRN
  Administered 2017-10-14: 15:00:00 via INTRAVENOUS

## 2017-10-14 MED ORDER — ONDANSETRON HCL 4 MG/2ML IJ SOLN: 4.0000 mg | Freq: Four times a day (QID) | INTRAMUSCULAR | Status: DC | PRN

## 2017-10-14 MED ORDER — DEXAMETHASONE SODIUM PHOSPHATE 10 MG/ML IJ SOLN
INTRAMUSCULAR | Status: DC | PRN
  Administered 2017-10-14: 10 mg via INTRAVENOUS

## 2017-10-14 MED ORDER — CEFAZOLIN SODIUM-DEXTROSE 2-4 GM/100ML-% IV SOLN
2.0000 g | Freq: Four times a day (QID) | INTRAVENOUS | Status: AC
  Administered 2017-10-15 (×2): 2 g via INTRAVENOUS
  Filled 2017-10-14 (×2): qty 100

## 2017-10-14 MED ORDER — METHOCARBAMOL 500 MG PO TABS: 500.0000 mg | ORAL_TABLET | Freq: Four times a day (QID) | ORAL | 0 refills | Status: DC | PRN

## 2017-10-14 MED ORDER — BISACODYL 10 MG RE SUPP
10.0000 mg | Freq: Every day | RECTAL | Status: DC | PRN
  Filled 2017-10-14: qty 1

## 2017-10-14 MED ORDER — ONDANSETRON HCL 4 MG/2ML IJ SOLN
INTRAMUSCULAR | Status: AC
  Filled 2017-10-14: qty 2

## 2017-10-14 MED ORDER — ASPIRIN 81 MG PO CHEW: 81.0000 mg | CHEWABLE_TABLET | Freq: Two times a day (BID) | ORAL | 0 refills | Status: AC

## 2017-10-14 MED ORDER — HYDROCODONE-ACETAMINOPHEN 5-325 MG PO TABS: 1.0000 | ORAL_TABLET | ORAL | 0 refills | Status: DC | PRN

## 2017-10-14 MED ORDER — FENTANYL CITRATE (PF) 100 MCG/2ML IJ SOLN
INTRAMUSCULAR | Status: DC | PRN
  Administered 2017-10-14 (×2): 25 ug via INTRAVENOUS

## 2017-10-14 MED ORDER — PHENOL 1.4 % MT LIQD: 1.0000 | OROMUCOSAL | Status: DC | PRN

## 2017-10-14 MED ORDER — SODIUM CHLORIDE 0.9 % IV SOLN
10.0000 mg | Freq: Two times a day (BID) | INTRAVENOUS | Status: DC
  Administered 2017-10-14 – 2017-10-15 (×3): 10 mg via INTRAVENOUS
  Filled 2017-10-14 (×5): qty 1

## 2017-10-14 MED ORDER — LABETALOL HCL 5 MG/ML IV SOLN
INTRAVENOUS | Status: AC
  Filled 2017-10-14: qty 4

## 2017-10-14 MED ORDER — PROPOFOL 10 MG/ML IV BOLUS
INTRAVENOUS | Status: DC | PRN
  Administered 2017-10-14: 120 mg via INTRAVENOUS

## 2017-10-14 MED ORDER — POVIDONE-IODINE 10 % EX SWAB: 2.0000 "application " | Freq: Once | CUTANEOUS | Status: DC

## 2017-10-14 MED ORDER — MENTHOL 3 MG MT LOZG: 1.0000 | LOZENGE | OROMUCOSAL | Status: DC | PRN

## 2017-10-14 MED ORDER — FERROUS SULFATE 325 (65 FE) MG PO TABS
325.0000 mg | ORAL_TABLET | Freq: Three times a day (TID) | ORAL | Status: DC
  Administered 2017-10-15 – 2017-10-16 (×3): 325 mg via ORAL
  Filled 2017-10-14 (×4): qty 1

## 2017-10-14 MED ORDER — DEXAMETHASONE SODIUM PHOSPHATE 10 MG/ML IJ SOLN: 8.0000 mg | Freq: Once | INTRAMUSCULAR | Status: DC

## 2017-10-14 MED ORDER — FENTANYL CITRATE (PF) 100 MCG/2ML IJ SOLN
25.0000 ug | INTRAMUSCULAR | Status: DC | PRN
  Administered 2017-10-14 (×2): 50 ug via INTRAVENOUS

## 2017-10-14 MED ORDER — PROPOFOL 10 MG/ML IV BOLUS
INTRAVENOUS | Status: AC
  Filled 2017-10-14: qty 20

## 2017-10-14 MED ORDER — METOCLOPRAMIDE HCL 5 MG/ML IJ SOLN: 5.0000 mg | Freq: Three times a day (TID) | INTRAMUSCULAR | Status: DC | PRN

## 2017-10-14 SURGICAL SUPPLY — 44 items
BAG SPEC THK2 15X12 ZIP CLS (MISCELLANEOUS) ×1
BAG ZIPLOCK 12X15 (MISCELLANEOUS) ×3 IMPLANT
BIT DRILL 4.3MMS DISTAL GRDTED (BIT) IMPLANT
BLADE SAW SGTL 11.0X1.19X90.0M (BLADE) IMPLANT
BNDG GAUZE ELAST 4 BULKY (GAUZE/BANDAGES/DRESSINGS) ×3 IMPLANT
COVER PERINEAL POST (MISCELLANEOUS) ×3 IMPLANT
COVER SURGICAL LIGHT HANDLE (MISCELLANEOUS) ×3 IMPLANT
DRAPE INCISE IOBAN 66X45 STRL (DRAPES) ×3 IMPLANT
DRAPE STERI IOBAN 125X83 (DRAPES) ×3 IMPLANT
DRILL 4.3MMS DISTAL GRADUATED (BIT) ×3
DRSG AQUACEL AG ADV 3.5X 4 (GAUZE/BANDAGES/DRESSINGS) ×6 IMPLANT
DRSG AQUACEL AG ADV 3.5X 6 (GAUZE/BANDAGES/DRESSINGS) ×3 IMPLANT
DRSG MEPILEX BORDER 4X12 (GAUZE/BANDAGES/DRESSINGS) ×2 IMPLANT
DRSG MEPILEX BORDER 4X4 (GAUZE/BANDAGES/DRESSINGS) ×2 IMPLANT
DURAPREP 26ML APPLICATOR (WOUND CARE) ×3 IMPLANT
ELECT REM PT RETURN 15FT ADLT (MISCELLANEOUS) ×3 IMPLANT
GAUZE SPONGE 4X4 12PLY STRL (GAUZE/BANDAGES/DRESSINGS) ×3 IMPLANT
GLOVE BIOGEL PI IND STRL 7.5 (GLOVE) ×1 IMPLANT
GLOVE BIOGEL PI IND STRL 8.5 (GLOVE) ×1 IMPLANT
GLOVE BIOGEL PI INDICATOR 7.5 (GLOVE) ×6
GLOVE BIOGEL PI INDICATOR 8.5 (GLOVE) ×2
GLOVE ECLIPSE 8.0 STRL XLNG CF (GLOVE) ×2 IMPLANT
GLOVE ORTHO TXT STRL SZ7.5 (GLOVE) ×6 IMPLANT
GLOVE SURG ORTHO 8.0 STRL STRW (GLOVE) ×3 IMPLANT
GLOVE SURG SS PI 7.5 STRL IVOR (GLOVE) ×2 IMPLANT
GOWN SPEC L3 XXLG W/TWL (GOWN DISPOSABLE) ×2 IMPLANT
GOWN STRL REUS W/TWL LRG LVL3 (GOWN DISPOSABLE) ×3 IMPLANT
GOWN STRL REUS W/TWL XL LVL3 (GOWN DISPOSABLE) ×4 IMPLANT
GUIDEPIN 3.2X17.5 THRD DISP (PIN) ×2 IMPLANT
GUIDEWIRE BALL NOSE 80CM (WIRE) ×2 IMPLANT
HIP FR NAIL LAG SCREW 10.5X110 (Orthopedic Implant) ×3 IMPLANT
HIP FRAC NAIL LEFT 11X360MM (Orthopedic Implant) ×3 IMPLANT
KIT BASIN OR (CUSTOM PROCEDURE TRAY) ×3 IMPLANT
MANIFOLD NEPTUNE II (INSTRUMENTS) ×3 IMPLANT
NAIL HIP FRAC LEFT 11X360MM (Orthopedic Implant) IMPLANT
PACK GENERAL/GYN (CUSTOM PROCEDURE TRAY) ×3 IMPLANT
POSITIONER SURGICAL ARM (MISCELLANEOUS) ×3 IMPLANT
SCREW BONE CORTICAL 5.0X36 (Screw) ×2 IMPLANT
SCREW LAG HIP FR NAIL 10.5X110 (Orthopedic Implant) IMPLANT
STAPLER VISISTAT (STAPLE) ×2 IMPLANT
SUT VIC AB 1 CT1 27 (SUTURE) ×3
SUT VIC AB 1 CT1 27XBRD ANTBC (SUTURE) ×1 IMPLANT
SUT VIC AB 2-0 CT1 27 (SUTURE) ×6
SUT VIC AB 2-0 CT1 27XBRD (SUTURE) ×2 IMPLANT

## 2017-10-14 NOTE — Anesthesia Postprocedure Evaluation (Signed)
Anesthesia Post Note  Patient: Laurie Galloway  Procedure(s) Performed: INTRAMEDULLARY (IM) NAIL INTERTROCHANTRIC (Left Hip)     Patient location during evaluation: PACU Anesthesia Type: General Level of consciousness: awake and alert Pain management: pain level controlled Vital Signs Assessment: post-procedure vital signs reviewed and stable Respiratory status: spontaneous breathing, nonlabored ventilation, respiratory function stable and patient connected to nasal cannula oxygen Cardiovascular status: blood pressure returned to baseline and stable Postop Assessment: no apparent nausea or vomiting Anesthetic complications: no    Last Vitals:  Vitals:   10/14/17 1815 10/14/17 1829  BP: (!) 163/66 (!) 149/57  Pulse: 79   Resp: 18 16  Temp:  36.8 C  SpO2: 97% 96%    Last Pain:  Vitals:   10/14/17 1437  TempSrc: Oral  PainSc:                  Effie Berkshire

## 2017-10-14 NOTE — Progress Notes (Signed)
Give labetalol 5 mg Iv q 5 min x 4 doses, per Dr Smith Robert.

## 2017-10-14 NOTE — Anesthesia Preprocedure Evaluation (Addendum)
Anesthesia Evaluation  Patient identified by MRN, date of birth, ID band Patient awake    Reviewed: Allergy & Precautions, NPO status , Patient's Chart, lab work & pertinent test results  History of Anesthesia Complications (+) PONV and history of anesthetic complications  Airway Mallampati: II  TM Distance: >3 FB Neck ROM: Full    Dental  (+) Teeth Intact, Dental Advisory Given   Pulmonary neg pulmonary ROS,    breath sounds clear to auscultation       Cardiovascular hypertension, Pt. on medications  Rhythm:Regular Rate:Normal     Neuro/Psych  Neuromuscular disease    GI/Hepatic negative GI ROS, Neg liver ROS,   Endo/Other  Hyperthyroidism   Renal/GU negative Renal ROS     Musculoskeletal  (+) Arthritis ,   Abdominal   Peds  Hematology negative hematology ROS (+)   Anesthesia Other Findings   Reproductive/Obstetrics                            Anesthesia Physical Anesthesia Plan  ASA: II  Anesthesia Plan: General   Post-op Pain Management:    Induction: Intravenous  PONV Risk Score and Plan: 4 or greater and Ondansetron and Dexamethasone  Airway Management Planned: LMA  Additional Equipment:   Intra-op Plan:   Post-operative Plan: Extubation in OR  Informed Consent: I have reviewed the patients History and Physical, chart, labs and discussed the procedure including the risks, benefits and alternatives for the proposed anesthesia with the patient or authorized representative who has indicated his/her understanding and acceptance.   Dental advisory given  Plan Discussed with: CRNA  Anesthesia Plan Comments:        Anesthesia Quick Evaluation

## 2017-10-14 NOTE — Anesthesia Procedure Notes (Signed)
Procedure Name: LMA Insertion Date/Time: 10/14/2017 3:54 PM Performed by: Dione Booze, CRNA Pre-anesthesia Checklist: Emergency Drugs available, Suction available, Patient being monitored and Patient identified Patient Re-evaluated:Patient Re-evaluated prior to induction Oxygen Delivery Method: Circle system utilized Preoxygenation: Pre-oxygenation with 100% oxygen Induction Type: IV induction LMA: LMA with gastric port inserted LMA Size: 4.0 Number of attempts: 1 Placement Confirmation: positive ETCO2 and breath sounds checked- equal and bilateral Tube secured with: Tape Dental Injury: Teeth and Oropharynx as per pre-operative assessment

## 2017-10-14 NOTE — Transfer of Care (Signed)
Immediate Anesthesia Transfer of Care Note  Patient: Laurie Galloway  Procedure(s) Performed: INTRAMEDULLARY (IM) NAIL INTERTROCHANTRIC (Left Hip)  Patient Location: PACU  Anesthesia Type:General  Level of Consciousness: sedated  Airway & Oxygen Therapy: Patient Spontanous Breathing and Patient connected to face mask oxygen  Post-op Assessment: Report given to RN and Post -op Vital signs reviewed and stable  Post vital signs: Reviewed and stable  Last Vitals:  Vitals:   10/14/17 1711 10/14/17 1715  BP: (!) 155/91   Pulse:  89  Resp: (!) 9 14  Temp: (!) (P) 36.3 C   SpO2: (P) 100% 100%    Last Pain:  Vitals:   10/14/17 1437  TempSrc: Oral  PainSc:       Patients Stated Pain Goal: 2 (10/40/45 9136)  Complications: No apparent anesthesia complications

## 2017-10-14 NOTE — Brief Op Note (Signed)
10/13/2017 - 10/14/2017  3:56 PM  PATIENT:  Weldon Picking  83 y.o. female  PRE-OPERATIVE DIAGNOSIS:  Comminuted left intertrochanteric femur fracture  POST-OPERATIVE DIAGNOSIS:  Comminuted left intertrochanteric femur fracture  PROCEDURE:  Procedure(s): INTRAMEDULLARY (IM) NAIL INTERTROCHANTRIC (Left)  SURGEON:  Surgeon(s) and Role:    Paralee Cancel, MD - Primary  PHYSICIAN ASSISTANT: None   ANESTHESIA:   general  EBL: <150cc  BLOOD ADMINISTERED:none  DRAINS: none   LOCAL MEDICATIONS USED:  NONE  SPECIMEN:  No Specimen  DISPOSITION OF SPECIMEN:  N/A  COUNTS:  YES  TOURNIQUET:  * No tourniquets in log *  DICTATION: .Other Dictation: Dictation Number [id number]  PLAN OF CARE: Admit to inpatient   PATIENT DISPOSITION:  PACU - hemodynamically stable.   Delay start of Pharmacological VTE agent (>24hrs) due to surgical blood loss or risk of bleeding: no

## 2017-10-14 NOTE — Progress Notes (Addendum)
Nurse paged hospitalist, Carolin Sicks, to clarify if IV pepcid is to be hung now or if it needs to go with patient to pre-op.   Per Carolin Sicks, nurse will hang IV pepcid now.

## 2017-10-14 NOTE — Progress Notes (Signed)
PROGRESS NOTE  Laurie Galloway:[id number] DOB: 11-22-1930 DOA: 10/13/2017 PCP: Mayra Neer, MD   LOS: 1 day   Brief Narrative / Interim history: 83 year-old female presented yesterday afternoon with left hip pain following a mechanical fall at home. She states that she was descending a step into her sunroom when she lost her balance and fell onto the left hip. Pt was unable to ambulate and came to the hospital via EMS. Imagining shows comminuted intertrochanteric fracture of left femur. Pt admitted to medicine service with plans for surgery today.      Assessment & Plan: Active Problems:   HTN (hypertension)   Subclinical hyperthyroidism   Closed left hip fracture, initial encounter [facility])   Hip fracture (HCC)   Cochlear implant in place   Closed left hip fracture Orthopedics plan for surgical repair this morning. Pt NPO since midnight. Not on [facility] or antiplatelet therapy. Type and screen sent, PT/INR normal. Checking vitamin D. Pain control with morphine, robaxin, and hydrocodone/apap. Pt will need PT/OT after surgery.  Hypertension Stable. Continuing losartan, metoprolol, and verapamil.    DVT prophylaxis: SCD Code Status: full code Family Communication: not at bedside Disposition Plan: pending PT recs  Consultants:   Orthopedics   Procedures:     Antimicrobials:     Subjective: Doing well, pain is controlled. No cp, sob, n/v.   Objective: Vitals:   10/13/17 1930 10/13/17 2021 10/14/17 0524 10/14/17 0805  BP: (!) 151/64 (!) 165/76 (!) 160/88 (!) 151/85  Pulse: 98 94 (!) 103 93  Resp: (!) 21 19 18    Temp:  98.6 F (37 C) 98.1 F (36.7 C)   TempSrc:  Oral Oral   SpO2: 93% 94% 94%   Weight:  64 kg (141 lb)    Height:  5\' 3"  (1.6 m)      Intake/Output Summary (Last 24 hours) at 10/14/2017 0951 Last data filed at 10/14/2017 0525 Gross per 24 hour  Intake 0 ml  Output 125 ml  Net -125 ml   Filed Weights   10/13/17 2021  Weight: 64 kg (141 lb)     Examination:  Constitutional: NAD Eyes: lids and conjunctivae normal ENMT: Mucous membranes are moist.  Neck: normal, supple, no masses, no thyromegaly Respiratory: clear to auscultation bilaterally, no wheezing, no crackles. Normal respiratory effort. No accessory muscle use.  Cardiovascular: Regular rate and rhythm, no murmurs / rubs / gallops. No LE edema. 2+ pedal pulses. No carotid bruits.  Abdomen: no tenderness. Bowel sounds positive.  Musculoskeletal: external rotation of RLE Skin: no rashes, lesions, ulcers. No induration Neurologic: Grossly non focal  Psychiatric: Normal judgment and insight. Alert and oriented x 3. Normal mood.    Data Reviewed: I have independently reviewed following labs and imaging studies   EKG Sinus rhythm at 88, 1st degree AV block, QTc 471. No ischemic changes.   CBC: Recent Labs  Lab 10/13/17 1636 10/14/17 0525  WBC 10.8* 10.4  NEUTROABS 8.8*  --   HGB 13.7 11.8*  HCT 41.1 35.8*  MCV 93.6 93.2  PLT 335 818   Basic Metabolic Panel: Recent Labs  Lab 10/13/17 1636 10/14/17 0525  NA 139 135  K 4.0 4.3  CL 103 102  CO2 27 28  GLUCOSE 118* 141*  BUN 21* 23*  CREATININE 0.75 0.67  CALCIUM 9.4 8.5*   GFR: Estimated Creatinine Clearance: 46.3 mL/min (by C-G formula based on SCr of 0.67 mg/dL). Liver Function Tests: Recent Labs  Lab 10/14/17 0525  ALBUMIN  3.5   No results for input(s): LIPASE, AMYLASE in the last 168 hours. No results for input(s): AMMONIA in the last 168 hours. Coagulation Profile: Recent Labs  Lab 10/13/17 1636  INR 1.00   Cardiac Enzymes: No results for input(s): CKTOTAL, CKMB, CKMBINDEX, TROPONINI in the last 168 hours. BNP (last 3 results) No results for input(s): PROBNP in the last 8760 hours. HbA1C: No results for input(s): HGBA1C in the last 72 hours. CBG: No results for input(s): GLUCAP in the last 168 hours. Lipid Profile: No results for input(s): CHOL, HDL, LDLCALC, TRIG, CHOLHDL,  LDLDIRECT in the last 72 hours. Thyroid Function Tests: No results for input(s): TSH, T4TOTAL, FREET4, T3FREE, THYROIDAB in the last 72 hours. Anemia Panel: No results for input(s): VITAMINB12, FOLATE, FERRITIN, TIBC, IRON, RETICCTPCT in the last 72 hours. Urine analysis:    Component Value Date/Time   COLORURINE YELLOW 04/01/2012 0642   APPEARANCEUR CLOUDY (A) 04/01/2012 0642   LABSPEC 1.023 04/01/2012 0642   PHURINE 7.0 04/01/2012 0642   GLUCOSEU NEGATIVE 04/01/2012 0642   HGBUR NEGATIVE 04/01/2012 0642   BILIRUBINUR NEGATIVE 04/01/2012 0642   KETONESUR 40 (A) 04/01/2012 0642   PROTEINUR NEGATIVE 04/01/2012 0642   UROBILINOGEN 0.2 04/01/2012 0642   NITRITE NEGATIVE 04/01/2012 0642   LEUKOCYTESUR NEGATIVE 04/01/2012 0642   Sepsis Labs: Invalid input(s): PROCALCITONIN, LACTICIDVEN  No results found for this or any previous visit (from the past 240 hour(s)).    Radiology Studies: Dg Chest 1 View  Result Date: 10/13/2017 CLINICAL DATA:  Preoperative exam for left hip fracture. EXAM: CHEST 1 VIEW COMPARISON:  Chest x-ray dated February 18, 2013. FINDINGS: The cardiomediastinal silhouette is normal in size. Normal pulmonary vascularity. No focal consolidation, pleural effusion, or pneumothorax. No acute osseous abnormality. IMPRESSION: No active disease. Electronically Signed   By: Titus Dubin M.D.   On: 10/13/2017 17:25   Dg Hip Unilat W Or Wo Pelvis 2-3 Views Left  Result Date: 10/13/2017 CLINICAL DATA:  Fall EXAM: DG HIP (WITH OR WITHOUT PELVIS) 2-3V LEFT COMPARISON:  None. FINDINGS: There is an intertrochanteric fracture of the proximal left femur that is comminuted with moderate overriding. The femoral head remains situated in the acetabulum. No pelvic fracture or diastasis. There is at least moderate right hip osteoarthrosis. IMPRESSION: Comminuted intertrochanteric fracture of the left femur without femoroacetabular dislocation. Electronically Signed   By: Ulyses Jarred M.D.    On: 10/13/2017 17:20     Scheduled Meds: . [START ON 10/15/2017] losartan  100 mg Oral q morning - 10a  . metoprolol tartrate  5 mg Intravenous Q8H  . [START ON 10/15/2017] verapamil  360 mg Oral q morning - 10a   Continuous Infusions: . methocarbamol (ROBAXIN)  IV         Time spent: 15 minutes    Audery Amel, PA-S 10/14/2017, 9:51 AM   @[email or web address] @

## 2017-10-14 NOTE — Consult Note (Signed)
Reason for Consult: Left hip fracture Referring Physician: Darnette Lampron is an 83 y.o. female.  HPI: 83 yo female fell in her house on hard cement floor in sun porch.  Immediate pain and inability to bear weight. Brought to ER for evaluation.  Reports no other extremity complaints.  Denies chest pain or loss of consciousness ar time of fall.   Patient known to me through care of family members  Past Medical History:  Diagnosis Date  . Arthritis    hands  . Carpal tunnel syndrome of left wrist 08/2016  . Dental crowns present   . Family history of adverse reaction to anesthesia    pt's daughter has hx. of post-op N/V  . Hypertension    states under control with meds., has been on med. x "long time"  . PONV (postoperative nausea and vomiting)   . White coat hypertension     Past Surgical History:  Procedure Laterality Date  . ANTERIOR AND POSTERIOR VAGINAL REPAIR  11/08/2002  . APPENDECTOMY    . CATARACT EXTRACTION W/ INTRAOCULAR LENS IMPLANT Left 01/09/2011  . CATARACT EXTRACTION W/ INTRAOCULAR LENS IMPLANT Right 03/06/2011  . EXTERNAL EAR SURGERY Bilateral    stapedectomy   . LAPAROSCOPIC ASSISTED VAGINAL HYSTERECTOMY  11/08/2002  . LAPAROSCOPIC BILATERAL SALPINGO OOPHERECTOMY Bilateral 11/08/2002  . LAPAROSCOPIC LYSIS OF ADHESIONS  11/08/2002  . LUMBAR FUSION  03/19/2010   L3-4, L4-5  . LUMBAR LAMINECTOMY/DECOMPRESSION MICRODISCECTOMY  03/19/2010   L3-4, L4-5  . SHOULDER ARTHROSCOPY W/ ROTATOR CUFF REPAIR    . TONSILLECTOMY     age 51    Family History  Problem Relation Age of Onset  . Anesthesia problems Daughter        post-op N/V    Social History:  reports that  has never smoked. she has never used smokeless tobacco. She reports that she does not drink alcohol or use drugs.  Allergies:  Allergies  Allergen Reactions  . Penicillins Rash  . Sulfa Antibiotics Rash  . Sulfacetamide Sodium Rash    Medications:  I have reviewed the patient's current  medications. Scheduled: . [START ON 10/15/2017] losartan  100 mg Oral q morning - 10a  . metoprolol tartrate  5 mg Intravenous Q8H  . [START ON 10/15/2017] verapamil  360 mg Oral q morning - 10a    Results for orders placed or performed during the hospital encounter of 10/13/17 (from the past 24 hour(s))  Basic metabolic panel     Status: Abnormal   Collection Time: 10/13/17  4:36 PM  Result Value Ref Range   Sodium 139 135 - 145 mmol/L   Potassium 4.0 3.5 - 5.1 mmol/L   Chloride 103 101 - 111 mmol/L   CO2 27 22 - 32 mmol/L   Glucose, Bld 118 (H) 65 - 99 mg/dL   BUN 21 (H) 6 - 20 mg/dL   Creatinine, Ser 0.75 0.44 - 1.00 mg/dL   Calcium 9.4 8.9 - 10.3 mg/dL   GFR calc non Af Amer >60 >60 mL/min   GFR calc Af Amer >60 >60 mL/min   Anion gap 9 5 - 15  CBC with Differential     Status: Abnormal   Collection Time: 10/13/17  4:36 PM  Result Value Ref Range   WBC 10.8 (H) 4.0 - 10.5 K/uL   RBC 4.39 3.87 - 5.11 MIL/uL   Hemoglobin 13.7 12.0 - 15.0 g/dL   HCT 41.1 36.0 - 46.0 %   MCV 93.6 78.0 -  100.0 fL   MCH 31.2 26.0 - 34.0 pg   MCHC 33.3 30.0 - 36.0 g/dL   RDW 14.1 11.5 - 15.5 %   Platelets 335 150 - 400 K/uL   Neutrophils Relative % 81 %   Neutro Abs 8.8 (H) 1.7 - 7.7 K/uL   Lymphocytes Relative 10 %   Lymphs Abs 1.0 0.7 - 4.0 K/uL   Monocytes Relative 8 %   Monocytes Absolute 0.9 0.1 - 1.0 K/uL   Eosinophils Relative 1 %   Eosinophils Absolute 0.2 0.0 - 0.7 K/uL   Basophils Relative 0 %   Basophils Absolute 0.0 0.0 - 0.1 K/uL  Protime-INR     Status: None   Collection Time: 10/13/17  4:36 PM  Result Value Ref Range   Prothrombin Time 13.1 11.4 - 15.2 seconds   INR 1.00   Type and screen [place]     Status: None   Collection Time: 10/13/17  4:36 PM  Result Value Ref Range   ABO/RH(D) O POS    Antibody Screen NEG    Sample Expiration 10/16/2017   CBC     Status: Abnormal   Collection Time: 10/14/17  5:25 AM  Result Value Ref Range   WBC 10.4  4.0 - 10.5 K/uL   RBC 3.84 (L) 3.87 - 5.11 MIL/uL   Hemoglobin 11.8 (L) 12.0 - 15.0 g/dL   HCT 35.8 (L) 36.0 - 46.0 %   MCV 93.2 78.0 - 100.0 fL   MCH 30.7 26.0 - 34.0 pg   MCHC 33.0 30.0 - 36.0 g/dL   RDW 14.0 11.5 - 15.5 %   Platelets 329 150 - 400 K/uL  Basic metabolic panel     Status: Abnormal   Collection Time: 10/14/17  5:25 AM  Result Value Ref Range   Sodium 135 135 - 145 mmol/L   Potassium 4.3 3.5 - 5.1 mmol/L   Chloride 102 101 - 111 mmol/L   CO2 28 22 - 32 mmol/L   Glucose, Bld 141 (H) 65 - 99 mg/dL   BUN 23 (H) 6 - 20 mg/dL   Creatinine, Ser 0.67 0.44 - 1.00 mg/dL   Calcium 8.5 (L) 8.9 - 10.3 mg/dL   GFR calc non Af Amer >60 >60 mL/min   GFR calc Af Amer >60 >60 mL/min   Anion gap 5 5 - 15  Albumin     Status: None   Collection Time: 10/14/17  5:25 AM  Result Value Ref Range   Albumin 3.5 3.5 - 5.0 g/dL    X-ray: CLINICAL DATA:  Fall  EXAM: DG HIP (WITH OR WITHOUT PELVIS) 2-3V LEFT  COMPARISON:  None.  FINDINGS: There is an intertrochanteric fracture of the proximal left femur that is comminuted with moderate overriding. The femoral head remains situated in the acetabulum. No pelvic fracture or diastasis. There is at least moderate right hip osteoarthrosis.  IMPRESSION: Comminuted intertrochanteric fracture of the left femur without femoroacetabular dislocation.   Electronically Signed   By: Ulyses Jarred M.D.  ROS: Constitutional: No fever/chills Eyes: No visual changes. ENT: No sore throat. Cardiovascular: Denies chest pain. Respiratory: Denies shortness of breath. Gastrointestinal: No abdominal pain.  No nausea, no vomiting.  No diarrhea.  No constipation. Genitourinary: Negative for dysuria. Musculoskeletal: Negative for back pain. Positive left hip pain.  Skin: Negative for rash. Neurological: Negative for headaches, focal weakness or numbness    Blood pressure (!) 151/85, pulse 93, temperature 98.1 F (36.7 C), temperature  source  Oral, resp. rate 18, height 5\' 3"  (1.6 m), weight 64 kg (141 lb), SpO2 94 %.  Physical Exam  Awake alert oriented Obvious discomfort Left LE shortened, ER, NVI Upper extremities normal Right LE normal General medical exam reviewed for pertinent info - stable  Assessment/Plan: Comminuted complex left peri-trochanteric proximal femur fracture, closed  Plan: Will need ORIF Plan to address this pm Npo Order set completed Reviewed necessity of procedure and complexity of problem Reviewed post op expectations Will plan to review with family  Mauri Pole 10/14/2017, 10:52 AM

## 2017-10-14 NOTE — Op Note (Signed)
Laurie Galloway, Laurie Galloway                  ACCOUNT NO.:  [id number]  MEDICAL RECORD NO.:  [id number]  LOCATION:  WA18                         FACILITY:  [facility]  PHYSICIAN:  Pietro Cassis. Alvan Dame, M.D.  DATE OF BIRTH:  [birth year]  DATE OF PROCEDURE:  10/14/2017 DATE OF DISCHARGE:                              OPERATIVE REPORT   PREOPERATIVE DIAGNOSIS:  Comminuted, complex left peritrochanteric femur fracture.  POSTOPERATIVE DIAGNOSIS:  Comminuted, complex left peritrochanteric femur fracture.  PROCEDURE:  Open reduction and internal fixation of left peritrochanteric femur fracture utilizing a Biomet Affixus nail 11 x 360 mm with a 130-degree lag screw and a distal interlock.  SURGEON:  Pietro Cassis. Alvan Dame, M.D.  ASSISTANT:  Surgical team.  ANESTHESIA:  General.  SPECIMENS:  None.  COMPLICATIONS:  None.  BLOOD LOSS:  Probably less than 150 mL.  INDICATIONS FOR PROCEDURE:  Laurie Galloway is an 83 year old female who unfortunately had a fall on her sun porch at home.  She landed on concrete.  She had immediate onset of pain and inability to bear weight, and was brought to the emergency room where radiographs revealed a comminuted, displaced left peritrochanteric femur fracture.  She was admitted to the Hospitalist for stabilization and per protocol.  She was seen and evaluated.  Risks, benefits and necessity of the procedure were discussed and reviewed.  Risks of infection, nonunion, malunion, need for future surgeries were discussed and reviewed in the setting of the benefit of fracture management and healing.  Consent was obtained.  PROCEDURE IN DETAIL:  The patient was brought to the operative theater. Once adequate anesthesia, preoperative antibiotics, Ancef administered, the patient was positioned supine on the Hana table.  Her right leg was flexed and abducted out of the way with bony prominences padded.  Once I was satisfied with the overall positioning, we evaluated the fracture with  traction and slight internal rotation, confirming the near anatomic reduction.  Given these findings, the left hip was then prepped and draped from the iliac crest to below the knee for a long nail and distal interlock.  The leg was then prepped and draped with a shower-curtain technique.  Time- out was performed identifying the patient, planned procedure and extremity.  Fluoroscopy was brought back to the field identifying the tip of the trochanter.  An incision was made proximal to this laterally. Soft tissue dissection was carried down to and then through the gluteus fascia.  A guidewire was then inserted into the tip of the trochanter past the fracture site into the shaft of the femur, confirmed radiographically.  I then reamed the trochanter to allow for entry of the nail.  I selected 11-mm x 360-mm nail after measuring the nail length.  I selected a long nail based on the peritroch nature of the fracture.  The nail was then passed by hand past the fracture site to the distal aspect of the femur without complication anteriorly.  Once I had the nail in appropriate position, lag guidewire was placed through the lag screw insertion into the center of the head in AP and lateral planes, measured and selected a 110-mm lag screw.  I then drilled for this.  Lag screw was then positioned.  Traction was let off the foot and I then medialized the shaft of the femur to the fracture.  I decided then to lock this down as a fixed angle device due to the nature of the fracture itself.  Once this was done, the jig was removed and final radiographs were obtained in AP and lateral plane.  Now, attention was directed to the distal interlock with traction off the leg was abducted slightly.  I then positioned fluoroscopy to allow for perfect circle technique distally.  Once this was visualized, an incision was made laterally.  I then drilled and placed a 36-mm screw through the static position.  This  was confirmed again in AP and lateral planes.  Once this was done, all final pictures were taken.  All wounds were irrigated with normal saline solution.  The proximal wound was closed in layers with #1 Vicryl in the gluteal fascia.  The remainder of the wound was closed with 2-0 Vicryl and staples on the skin.  I selected staples due to the thin nature of her epidermis.  The wounds were then cleaned, dried and dressed sterilely using Mepilex dressing.  She was then brought to the recovery room in stable condition, tolerating the procedure well, findings were reviewed with her family.  I will have her be partial weightbearing for probably up to 6 weeks to allow for fracture healing to prevent complications with nonunion or malunion.     Pietro Cassis Alvan Dame, M.D.     MDO/MEDQ  D:  10/14/2017  T:  10/14/2017  Job:  [id number]

## 2017-10-14 NOTE — Anesthesia Procedure Notes (Signed)
Date/Time: 10/14/2017 5:05 PM Performed by: Cynda Familia, CRNA Pre-anesthesia Checklist: Patient identified, Emergency Drugs available, Suction available, Patient being monitored and Timeout performed Oxygen Delivery Method: Simple face mask Placement Confirmation: breath sounds checked- equal and bilateral and positive ETCO2 Dental Injury: Teeth and Oropharynx as per pre-operative assessment

## 2017-10-15 ENCOUNTER — Encounter (HOSPITAL_COMMUNITY): Payer: Self-pay | Admitting: Orthopedic Surgery

## 2017-10-15 DIAGNOSIS — S72002A Fracture of unspecified part of neck of left femur, initial encounter for closed fracture: Secondary | ICD-10-CM

## 2017-10-15 DIAGNOSIS — I10 Essential (primary) hypertension: Secondary | ICD-10-CM

## 2017-10-15 LAB — BASIC METABOLIC PANEL
Anion gap: 5 (ref 5–15)
BUN: 23 mg/dL — ABNORMAL HIGH (ref 6–20)
CO2: 25 mmol/L (ref 22–32)
Calcium: 8.1 mg/dL — ABNORMAL LOW (ref 8.9–10.3)
Chloride: 106 mmol/L (ref 101–111)
Creatinine, Ser: 0.64 mg/dL (ref 0.44–1.00)
GFR calc Af Amer: >60 mL/min (ref >60)
GFR calc non Af Amer: >60 mL/min (ref >60)
Glucose, Bld: 120 mg/dL — ABNORMAL HIGH (ref 65–99)
Potassium: 4.2 mmol/L (ref 3.5–5.1)
Sodium: 136 mmol/L (ref 135–145)

## 2017-10-15 LAB — CBC
HCT: 32.1 % — ABNORMAL LOW (ref 36.0–46.0)
Hemoglobin: 10.8 g/dL — ABNORMAL LOW (ref 12.0–15.0)
MCH: 31.7 pg (ref 26.0–34.0)
MCHC: 33.6 g/dL (ref 30.0–36.0)
MCV: 94.1 fL (ref 78.0–100.0)
Platelets: 282 10*3/uL (ref 150–400)
RBC: 3.41 MIL/uL — ABNORMAL LOW (ref 3.87–5.11)
RDW: 14.4 % (ref 11.5–15.5)
WBC: 12.1 10*3/uL — ABNORMAL HIGH (ref 4.0–10.5)

## 2017-10-15 LAB — VITAMIN D 25 HYDROXY (VIT D DEFICIENCY, FRACTURES): Vit D, 25-Hydroxy: 46.2 ng/mL (ref 30.0–100.0)

## 2017-10-15 MED ORDER — SENNOSIDES-DOCUSATE SODIUM 8.6-50 MG PO TABS
1.0000 | ORAL_TABLET | Freq: Two times a day (BID) | ORAL | Status: DC
  Administered 2017-10-15 – 2017-10-17 (×4): 1 via ORAL
  Filled 2017-10-15 (×4): qty 1

## 2017-10-15 NOTE — Progress Notes (Addendum)
CSW following to assist with patient disposition. The patient and daughter both prefer home.   The patient will work with  PT after surgery to help determine disposition.   Kathrin Greathouse, Latanya Presser, MSW Clinical Social Worker  [phone] 10/15/2017  9:16 AM

## 2017-10-15 NOTE — Progress Notes (Addendum)
PROGRESS NOTE  Laurie Galloway YCX:[id number] DOB: 11-01-1930 DOA: 10/13/2017 PCP: Mayra Neer, MD  HPI/Recap of past 24 hours: Post op day one, family at bedside Foley in place  Assessment/Plan: Active Problems:   HTN (hypertension)   Subclinical hyperthyroidism   Closed left hip fracture, initial encounter ([place])   Hip fracture ([place])   Cochlear implant in place  Mechanical fall with left hip fracture:  -S/p INTRAMEDULLARY (IM) NAIL INTERTROCHANTRIC (Left) on 11/13 -Wound care, pain control, activity level, DVT prophylaxis per ortho  HTN;  She was briefly on iv metop perioepratively Now able to take oral meds, resume home  home meds losartan, verapamil  Constipation: stool softener   FTT:  Chronic back pain radiating down to right leg, now s/p left hip surgery Will likely benefit from SNF. Will need to follow with neurospine surgeon and orthopedic surgeon.   Code Status: DNR  Family Communication: patient and daughter at bedside  Disposition Plan: SNF, patient is the sole caregiver to her disabled daughter at home prior to the fall    Consultants:  orthopedics  Procedures: INTRAMEDULLARY (IM) NAIL INTERTROCHANTRIC (Left) on 11/13 by orthopedics Dr Alvan Dame    Antibiotics:  Perioperative ancef   Objective: BP 137/66 (BP Location: Left Arm)   Pulse 86   Temp 99.1 F (37.3 C) (Oral)   Resp 18   Ht 5\' 3"  (1.6 m)   Wt 64 kg (141 lb)   SpO2 97%   BMI 24.98 kg/m   Intake/Output Summary (Last 24 hours) at 10/15/2017 1026 Last data filed at 10/15/2017 0900 Gross per 24 hour  Intake 1520 ml  Output 1150 ml  Net 370 ml   Filed Weights   10/13/17 2021  Weight: 64 kg (141 lb)    Exam: Patient is examined daily including today on 10/15/2017, exams remain the same as of yesterday except that has changed    General:  NAD  Cardiovascular: RRR  Respiratory: CTABL  Abdomen: Soft/ND/NT, positive BS  Musculoskeletal: left hip post op changes,  dressing intact, neurovascular intact distally  Neuro: alert, oriented   Data Reviewed: Basic Metabolic Panel: Recent Labs  Lab 10/13/17 1636 10/14/17 0525 10/15/17 0613  NA 139 135 136  K 4.0 4.3 4.2  CL 103 102 106  CO2 27 28 25   GLUCOSE 118* 141* 120*  BUN 21* 23* 23*  CREATININE 0.75 0.67 0.64  CALCIUM 9.4 8.5* 8.1*   Liver Function Tests: Recent Labs  Lab 10/14/17 0525  ALBUMIN 3.5   No results for input(s): LIPASE, AMYLASE in the last 168 hours. No results for input(s): AMMONIA in the last 168 hours. CBC: Recent Labs  Lab 10/13/17 1636 10/14/17 0525 10/15/17 0613  WBC 10.8* 10.4 12.1*  NEUTROABS 8.8*  --   --   HGB 13.7 11.8* 10.8*  HCT 41.1 35.8* 32.1*  MCV 93.6 93.2 94.1  PLT 335 329 282   Cardiac Enzymes:   No results for input(s): CKTOTAL, CKMB, CKMBINDEX, TROPONINI in the last 168 hours. BNP (last 3 results) No results for input(s): BNP in the last 8760 hours.  ProBNP (last 3 results) No results for input(s): PROBNP in the last 8760 hours.  CBG: No results for input(s): GLUCAP in the last 168 hours.  No results found for this or any previous visit (from the past 240 hour(s)).   Studies: Dg C-arm 1-60 Min-no Report  Result Date: 10/14/2017 Fluoroscopy was utilized by the requesting physician.  No radiographic interpretation.   Dg Femur Min  2 Views Left  Result Date: 10/14/2017 CLINICAL DATA:  Intraoperative imaging for left-sided femoral IM nail placement due to hip fracture. EXAM: LEFT FEMUR 2 VIEWS; DG C-ARM 1-60 MIN-NO REPORT COMPARISON:  Radiographs from 10/13/2017 FINDINGS: Left femoral IM nail traverses the intertrochanteric fracture and demonstrates good positioning. The distal extent of the inter trochanteric nail was not included on these images. IMPRESSION: 1. Proximal portion of left femoral IM nail traverses the intratrochanteric fracture and appears appropriately positioned. Electronically Signed   By: Van Clines M.D.   On:  10/14/2017 19:01    Scheduled Meds: . aspirin EC  325 mg Oral BID  . ferrous sulfate  325 mg Oral TID PC  . losartan  100 mg Oral q morning - 10a  . metoprolol tartrate  5 mg Intravenous Q8H  . senna-docusate  1 tablet Oral BID  . verapamil  360 mg Oral q morning - 10a    Continuous Infusions: . famotidine (PEPCID) IV Stopped (10/14/17 2337)  . methocarbamol (ROBAXIN)  IV       Time spent: 4mins I have personally reviewed and interpreted on  10/15/2017 daily labs,  imagings as discussed above under date review session and assessment and plans.  I reviewed all nursing notes, pharmacy notes, consultant notes,  vitals, pertinent old records  I have discussed plan of care as described above with RN , patient and family on 10/15/2017   Gwynn Chalker MD, PhD  [facility] Pager [phone]. If 7PM-7AM, please contact night-coverage at [email or web address], password [facility] 10/15/2017, 10:26 AM  LOS: 2 days

## 2017-10-15 NOTE — Progress Notes (Signed)
Plan for d/c to SNF, discharge planning per CSW. [phone] 

## 2017-10-15 NOTE — Progress Notes (Signed)
     Subjective: 1 Day Post-Op Procedure(s) (LRB): INTRAMEDULLARY (IM) NAIL INTERTROCHANTRIC (Left)   Patient reports pain as mild, pain controlled.  No events throughout the night.  Feels that the hip has minimal pain and wishes to try to be discharged home depending on progress with PT.   Objective:   VITALS:   Vitals:   10/15/17 0101 10/15/17 0559  BP: (!) 134/59 (!) 163/95  Pulse: 73 92  Resp: 18 17  Temp: 98.3 F (36.8 C) (!) 97.4 F (36.3 C)  SpO2: 96% 97%    Dorsiflexion/Plantar flexion intact Incision: dressing C/D/I No cellulitis present Compartment soft  LABS Recent Labs    10/13/17 1636 10/14/17 0525 10/15/17 0613  HGB 13.7 11.8* 10.8*  HCT 41.1 35.8* 32.1*  WBC 10.8* 10.4 12.1*  PLT 335 329 282    Recent Labs    10/13/17 1636 10/14/17 0525 10/15/17 0613  NA 139 135 136  K 4.0 4.3 4.2  BUN 21* 23* 23*  CREATININE 0.75 0.67 0.64  GLUCOSE 118* 141* 120*     Assessment/Plan: 1 Day Post-Op Procedure(s) (LRB): INTRAMEDULLARY (IM) NAIL INTERTROCHANTRIC (Left) Up with therapy Discharge disposition to be determined Plan to change the dressings to Aquacel dressings prior to discharge.  Ortho recommendations:  ASA 81 mg bid for 4 weeks for anticoagulation, unless other medically indicated.  Norco for pain management (Rx written).  Robaxin for muscle spasms (Rx written).  MiraLax and Colace for constipation  Iron 325 mg tid for 2-3 weeks   PWB 50%  on the left leg.  Dressing to remain in place until follow in clinic in 2 weeks.  Dressing is waterproof and may shower with it in place.  Follow up in 2 weeks at [facility]. Follow up with OLIN,Elysha Daw D in 2 weeks.  Contact information:  [facility] [address], [address] [place] Kylian Loh   PAC  10/15/2017, 9:43 AM

## 2017-10-15 NOTE — NC FL2 (Signed)
[place] LEVEL OF CARE SCREENING TOOL     IDENTIFICATION  Patient Name: Laurie Galloway Birthdate: May 20, 1930 Sex: female Admission Date (Current Location): 10/13/2017  [facility] and [place] Number:  Herbalist and Address:  [facility],  [facility] [address], [place]      Provider Number: [id number]  Attending Physician Name and Address:  Florencia Reasons, MD  Relative Name and Phone Number:       Current Level of Care: Hospital Recommended Level of Care: [place] Prior Approval Number:    Date Approved/Denied:   PASRR Number:    Discharge Plan: SNF    Current Diagnoses: Patient Active Problem List   Diagnosis Date Noted  . Closed left hip fracture, initial encounter ([place]) 10/13/2017  . Hip fracture ([place]) 10/13/2017  . Cochlear implant in place 10/13/2017  . Subclinical hyperthyroidism 06/18/2013  . Small bowel obstruction, partial ([place]) 04/01/2012  . Small bowel obstruction ([place]) 01/07/2012  . HTN (hypertension) 01/07/2012  . Tachycardia 01/07/2012  . Gastrointestinal bleeding 01/07/2012  . Chronic back pain 01/07/2012  . Epigastric pain 01/07/2012    Orientation RESPIRATION BLADDER Height & Weight     Self, Time, Situation, Place  Normal Continent Weight: 141 lb (64 kg) Height:  5\' 3"  (160 cm)  BEHAVIORAL SYMPTOMS/MOOD NEUROLOGICAL BOWEL NUTRITION STATUS      Continent Diet  AMBULATORY STATUS COMMUNICATION OF NEEDS Skin   Extensive Assist Verbally Normal                       Personal Care Assistance Level of Assistance  Bathing, Feeding, Dressing Bathing Assistance: Limited assistance Feeding assistance: Independent Dressing Assistance: Limited assistance     Functional Limitations Info  Sight, Hearing, Speech Sight Info: Adequate Hearing Info: Adequate Speech Info: Adequate    SPECIAL CARE FACTORS FREQUENCY  PT (By licensed PT), OT (By licensed OT)     PT Frequency: 5x/week OT Frequency:  5x/week            Contractures Contractures Info: Not present    Additional Factors Info  Code Status, Allergies, Psychotropic Code Status Info: DNR Allergies Info: Penicillins, Sulfa Antibiotics, Sulfacetamide Sodium           Current Medications (10/15/2017):  This is the current hospital active medication list Current Facility-Administered Medications  Medication Dose Route Frequency Provider Last Rate Last Dose  . aspirin EC tablet 325 mg  325 mg Oral BID Danae Orleans, PA-C   325 mg at 10/15/17 0756  . bisacodyl (DULCOLAX) suppository 10 mg  10 mg Rectal Daily PRN Danae Orleans, PA-C      . famotidine (PEPCID) 10 mg in sodium chloride 0.9 % 25 mL  10 mg Intravenous Q12H Rosita Fire, MD   Stopped at 10/14/17 2337  . ferrous sulfate tablet 325 mg  325 mg Oral TID PC Babish, Matthew, PA-C      . HYDROcodone-acetaminophen (NORCO/VICODIN) 5-325 MG per tablet 1-2 tablet  1-2 tablet Oral Q6H PRN Toy Baker, MD   1 tablet at 10/15/17 1153  . losartan (COZAAR) tablet 100 mg  100 mg Oral q morning - 10a Doutova, Anastassia, MD   100 mg at 10/15/17 1132  . magnesium citrate solution 1 Bottle  1 Bottle Oral Once PRN Babish, Matthew, PA-C      . menthol-cetylpyridinium (CEPACOL) lozenge 3 mg  1 lozenge Oral PRN Danae Orleans, PA-C       Or  .  phenol (CHLORASEPTIC) mouth spray 1 spray  1 spray Mouth/Throat PRN Babish, Matthew, PA-C      . methocarbamol (ROBAXIN) tablet 500 mg  500 mg Oral Q6H PRN Toy Baker, MD   500 mg at 10/15/17 0636   Or  . methocarbamol (ROBAXIN) 500 mg in dextrose 5 % 50 mL IVPB  500 mg Intravenous Q6H PRN Doutova, Anastassia, MD      . metoCLOPramide (REGLAN) tablet 5-10 mg  5-10 mg Oral Q8H PRN Babish, Matthew, PA-C       Or  . metoCLOPramide (REGLAN) injection 5-10 mg  5-10 mg Intravenous Q8H PRN Babish, Matthew, PA-C      . metoprolol tartrate (LOPRESSOR) injection 5 mg  5 mg Intravenous Q8H Rosita Fire, MD   5 mg at  10/15/17 0636  . morphine 4 MG/ML injection 1-2 mg  1-2 mg Intravenous Q3H PRN Toy Baker, MD   2 mg at 10/14/17 1150  . ondansetron (ZOFRAN) tablet 4 mg  4 mg Oral Q6H PRN Danae Orleans, PA-C       Or  . ondansetron (ZOFRAN) injection 4 mg  4 mg Intravenous Q6H PRN Babish, Matthew, PA-C      . polyethylene glycol (MIRALAX / GLYCOLAX) packet 17 g  17 g Oral Daily PRN Doutova, Anastassia, MD      . senna-docusate (Senokot-S) tablet 1 tablet  1 tablet Oral BID Florencia Reasons, MD   1 tablet at 10/15/17 1153  . verapamil (CALAN-SR) CR tablet 360 mg  360 mg Oral q morning - 10a Doutova, Anastassia, MD   360 mg at 10/15/17 1131     Discharge Medications: Please see discharge summary for a list of discharge medications.  Relevant Imaging Results:  Relevant Lab Results:   Additional Information ssn:[id number]  Lia Hopping, LCSW

## 2017-10-15 NOTE — Progress Notes (Signed)
Patient reports difficulty getting up due to pain and prefers to rehab at [facility].  Patient gave CSW permission to fax clinical information to local SNF's. CSW informed patient she will need prior authorization  before going to SNF, which can take up to 48 hours after facility has been chosen.   CSW provided daughter and patient with SNF list.   CSW will continue to assist with discharge.   Kathrin Greathouse, Latanya Presser, MSW Clinical Social Worker  [phone] 10/15/2017  3:02 PM

## 2017-10-15 NOTE — Evaluation (Signed)
Physical Therapy Evaluation Patient Details Name: Laurie Galloway MRN: [id number] DOB: [birth year] Today's Date: 10/15/2017   History of Present Illness  83 yo female sustained fall with left Comminuted, complex left peritrochanteric femur on 11/112/18. S/P IM nail .  Clinical Impression  The patient is requiring 2 assist today for mobility. Patient will benefit from SNF for rehab as patient is caregiver for daughter. Pt admitted with above diagnosis. Pt currently with functional limitations due to the deficits listed below (see PT Problem List).  Pt will benefit from skilled PT to increase their independence and safety with mobility to allow discharge to the venue listed below.       Follow Up Recommendations SNF    Equipment Recommendations  Rolling walker with 5" wheels    Recommendations for Other Services       Precautions / Restrictions Precautions Precautions: Fall Restrictions Weight Bearing Restrictions: Yes LLE Weight Bearing: Partial weight bearing LLE Partial Weight Bearing Percentage or Pounds: not indicated in order, pt. basically TDWB today.      Mobility  Bed Mobility Overal bed mobility: Needs Assistance Bed Mobility: Supine to Sit;Sit to Supine     Supine to sit: Max assist;+2 for physical assistance;+2 for safety/equipment Sit to supine: +2 for safety/equipment;+2 for physical assistance;Total assist   General bed mobility comments: use of bed pad and pillow under the left leg to slide to bed edge, use of trapeaze to self lift buttocks. Total assist to return to supine with pad and pilloww.  Transfers Overall transfer level: Needs assistance Equipment used: Rolling walker (2 wheeled) Transfers: Sit to/from Stand Sit to Stand: Mod assist;+2 physical assistance;+2 safety/equipment;From elevated surface         General transfer comment: cues for hand and left leg position. support of the left leg to decrease flexion of the left leg during transition. Able  to slide step  3 ' along bed edge.   Ambulation/Gait                Stairs            Wheelchair Mobility    Modified Rankin (Stroke Patients Only)       Balance                                             Pertinent Vitals/Pain Pain Assessment: 0-10 Pain Score: 8  Pain Location: left hip Pain Descriptors / Indicators: Aching;Shooting;Contraction;Discomfort Pain Intervention(s): Repositioned;Premedicated before session;Ice applied;Monitored during session;Limited activity within patient's tolerance    Home Living Family/patient expects to be discharged to:: Skilled nursing facility Living Arrangements: Children               Additional Comments: 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    Prior Function Level of Independence: Independent               Hand Dominance        Extremity/Trunk Assessment   Upper Extremity Assessment Upper Extremity Assessment: Defer to OT evaluation    Lower Extremity Assessment Lower Extremity Assessment: LLE deficits/detail;RLE deficits/detail RLE Deficits / Details: wfl LLE Deficits / Details: requires assistance to move the left leg using pillow to slide the leg, minimal weight on the left leg    Cervical / Trunk Assessment Cervical / Trunk Assessment: Kyphotic  Communication   Communication: No difficulties  Cognition Arousal/Alertness: Awake/alert  Behavior During Therapy: WFL for tasks assessed/performed Overall Cognitive Status: Within Functional Limits for tasks assessed                                        General Comments      Exercises     Assessment/Plan    PT Assessment Patient needs continued PT services  PT Problem List Decreased strength;Decreased range of motion;Decreased activity tolerance;Decreased balance;Decreased mobility;Decreased knowledge of precautions;Decreased safety awareness;Pain;Decreased knowledge of use of DME       PT  Treatment Interventions DME instruction;Gait training;Stair training;Functional mobility training;Therapeutic activities;Patient/family education;Therapeutic exercise    PT Goals (Current goals can be found in the Care Plan section)  Acute Rehab PT Goals Patient Stated Goal: to go home PT Goal Formulation: With patient/family Time For Goal Achievement: 10/29/17 Potential to Achieve Goals: Good    Frequency Min 3X/week   Barriers to discharge Decreased caregiver support is caregiver of special needs daughter.    Co-evaluation PT/OT/SLP Co-Evaluation/Treatment: Yes             AM-PAC PT "6 Clicks" Daily Activity  Outcome Measure Difficulty turning over in bed (including adjusting bedclothes, sheets and blankets)?: Unable Difficulty moving from lying on back to sitting on the side of the bed? : Unable Difficulty sitting down on and standing up from a chair with arms (e.g., wheelchair, bedside commode, etc,.)?: Unable Help needed moving to and from a bed to chair (including a wheelchair)?: Total Help needed walking in hospital room?: Total Help needed climbing 3-5 steps with a railing? : Total 6 Click Score: 6    End of Session   Activity Tolerance: Patient tolerated treatment well Patient left: in bed;with call bell/phone within reach;with family/visitor present Nurse Communication: Mobility status PT Visit Diagnosis: Difficulty in walking, not elsewhere classified (R26.2);Pain Pain - Right/Left: Left Pain - part of body: Hip    Time: 1330-1405 PT Time Calculation (min) (ACUTE ONLY): 35 min   Charges:   PT Evaluation $PT Eval Low Complexity: 1 Low PT Treatments $Therapeutic Activity: 8-22 mins   PT G CodesTresa Endo PT [phone]   Claretha Cooper 10/15/2017, 2:57 PM

## 2017-10-16 DIAGNOSIS — K5909 Other constipation: Secondary | ICD-10-CM

## 2017-10-16 LAB — CBC
HCT: 27.1 % — ABNORMAL LOW (ref 36.0–46.0)
Hemoglobin: 9 g/dL — ABNORMAL LOW (ref 12.0–15.0)
MCH: 31.6 pg (ref 26.0–34.0)
MCHC: 33.2 g/dL (ref 30.0–36.0)
MCV: 95.1 fL (ref 78.0–100.0)
Platelets: 226 10*3/uL (ref 150–400)
RBC: 2.85 MIL/uL — ABNORMAL LOW (ref 3.87–5.11)
RDW: 14.6 % (ref 11.5–15.5)
WBC: 10.7 10*3/uL — ABNORMAL HIGH (ref 4.0–10.5)

## 2017-10-16 LAB — BASIC METABOLIC PANEL
Anion gap: 4 — ABNORMAL LOW (ref 5–15)
BUN: 19 mg/dL (ref 6–20)
CO2: 28 mmol/L (ref 22–32)
Calcium: 7.8 mg/dL — ABNORMAL LOW (ref 8.9–10.3)
Chloride: 106 mmol/L (ref 101–111)
Creatinine, Ser: 0.54 mg/dL (ref 0.44–1.00)
GFR calc Af Amer: >60 mL/min (ref >60)
GFR calc non Af Amer: >60 mL/min (ref >60)
Glucose, Bld: 106 mg/dL — ABNORMAL HIGH (ref 65–99)
Potassium: 4 mmol/L (ref 3.5–5.1)
Sodium: 138 mmol/L (ref 135–145)

## 2017-10-16 MED ORDER — FERROUS SULFATE 325 (65 FE) MG PO TABS
325.0000 mg | ORAL_TABLET | Freq: Every day | ORAL | Status: DC
  Administered 2017-10-17: 325 mg via ORAL
  Filled 2017-10-16: qty 1

## 2017-10-16 MED ORDER — POLYETHYLENE GLYCOL 3350 17 G PO PACK
17.0000 g | PACK | Freq: Every day | ORAL | Status: DC
  Administered 2017-10-16 – 2017-10-17 (×2): 17 g via ORAL
  Filled 2017-10-16 (×2): qty 1

## 2017-10-16 MED ORDER — FAMOTIDINE 20 MG PO TABS
10.0000 mg | ORAL_TABLET | Freq: Every day | ORAL | Status: DC
  Administered 2017-10-16 – 2017-10-17 (×2): 10 mg via ORAL
  Filled 2017-10-16 (×2): qty 1

## 2017-10-16 NOTE — Progress Notes (Addendum)
PROGRESS NOTE  Laurie Galloway PPI:[id number] DOB: 1930/07/21 DOA: 10/13/2017 PCP: Mayra Neer, MD  HPI/Recap of past 24 hours:  Post op day two, no pain at rest, report pain with activity No bm Foley removed  Assessment/Plan: Active Problems:   HTN (hypertension)   Subclinical hyperthyroidism   Closed left hip fracture, initial encounter ([place])   Hip fracture (HCC)   Cochlear implant in place  Mechanical fall with left hip fracture:  -S/p INTRAMEDULLARY (IM) NAIL INTERTROCHANTRIC (Left) on 11/13 -Wound care, pain control, activity level, DVT prophylaxis per ortho  HTN;  She was briefly on iv metop perioepratively Now able to take oral meds, resume home  home meds losartan, verapamil  Constipation: stool softener   FTT:  Chronic back pain radiating down to right leg, now s/p left hip surgery Will likely benefit from SNF. Will need to follow with neurospine surgeon and orthopedic surgeon.   Code Status: DNR  Family Communication: patient and daughter at bedside  Disposition Plan: SNF, patient is the sole caregiver to her disabled daughter at home prior to the fall    Consultants:  orthopedics  Procedures: INTRAMEDULLARY (IM) NAIL INTERTROCHANTRIC (Left) on 11/13 by orthopedics Dr Alvan Dame    Antibiotics:  Perioperative ancef   Objective: BP (!) 148/62 (BP Location: Left Arm)   Pulse 87   Temp 98 F (36.7 C) (Oral)   Resp 18   Ht 5\' 3"  (1.6 m)   Wt 64 kg (141 lb)   SpO2 96%   BMI 24.98 kg/m   Intake/Output Summary (Last 24 hours) at 10/16/2017 1132 Last data filed at 10/16/2017 1017 Gross per 24 hour  Intake 960 ml  Output 100 ml  Net 860 ml   Filed Weights   10/13/17 2021  Weight: 64 kg (141 lb)    Exam: Patient is examined daily including today on 10/16/2017, exams remain the same as of yesterday except that has changed    General:  NAD  Cardiovascular: RRR  Respiratory: CTABL  Abdomen: Soft/ND/NT, positive BS  Musculoskeletal:  left hip post op changes, dressing intact, neurovascular intact distally  Neuro: alert, oriented   Data Reviewed: Basic Metabolic Panel: Recent Labs  Lab 10/13/17 1636 10/14/17 0525 10/15/17 0613 10/16/17 0522  NA 139 135 136 138  K 4.0 4.3 4.2 4.0  CL 103 102 106 106  CO2 27 28 25 28   GLUCOSE 118* 141* 120* 106*  BUN 21* 23* 23* 19  CREATININE 0.75 0.67 0.64 0.54  CALCIUM 9.4 8.5* 8.1* 7.8*   Liver Function Tests: Recent Labs  Lab 10/14/17 0525  ALBUMIN 3.5   No results for input(s): LIPASE, AMYLASE in the last 168 hours. No results for input(s): AMMONIA in the last 168 hours. CBC: Recent Labs  Lab 10/13/17 1636 10/14/17 0525 10/15/17 0613 10/16/17 0522  WBC 10.8* 10.4 12.1* 10.7*  NEUTROABS 8.8*  --   --   --   HGB 13.7 11.8* 10.8* 9.0*  HCT 41.1 35.8* 32.1* 27.1*  MCV 93.6 93.2 94.1 95.1  PLT 335 329 282 226   Cardiac Enzymes:   No results for input(s): CKTOTAL, CKMB, CKMBINDEX, TROPONINI in the last 168 hours. BNP (last 3 results) No results for input(s): BNP in the last 8760 hours.  ProBNP (last 3 results) No results for input(s): PROBNP in the last 8760 hours.  CBG: No results for input(s): GLUCAP in the last 168 hours.  No results found for this or any previous visit (from the past 240 hour(s)).  Studies: No results found.  Scheduled Meds: . aspirin EC  325 mg Oral BID  . famotidine  10 mg Oral Daily  . [START ON 10/17/2017] ferrous sulfate  325 mg Oral Q breakfast  . losartan  100 mg Oral q morning - 10a  . polyethylene glycol  17 g Oral Daily  . senna-docusate  1 tablet Oral BID  . verapamil  360 mg Oral q morning - 10a    Continuous Infusions: . methocarbamol (ROBAXIN)  IV       Time spent: 43mins I have personally reviewed and interpreted on  10/16/2017 daily labs,  imagings as discussed above under date review session and assessment and plans.  I reviewed all nursing notes, pharmacy notes, consultant notes,  vitals, pertinent  old records  I have discussed plan of care as described above with RN , patient and family on 10/16/2017   Hykeem Ojeda MD, PhD  [facility] Pager [phone]. If 7PM-7AM, please contact night-coverage at [email or web address], password [facility] 10/16/2017, 11:32 AM  LOS: 3 days

## 2017-10-16 NOTE — Progress Notes (Signed)
Physical Therapy Treatment Patient Details Name: Laurie Galloway MRN: [id number] DOB: 09-16-1930 Today's Date: 10/16/2017    History of Present Illness 83 yo female sustained fall with left Comminuted, complex left peritrochanteric femur on 11/112/18. S/P IM nail .    PT Comments    The patient is making slow progress. Increased pain with movement of left hip. Plans SNF.   Follow Up Recommendations  SNF     Equipment Recommendations  Rolling walker with 5" wheels    Recommendations for Other Services       Precautions / Restrictions Precautions Precautions: Fall Restrictions Weight Bearing Restrictions: Yes LLE Weight Bearing: Partial weight bearing LLE Partial Weight Bearing Percentage or Pounds: not indicated in order, pt. basically TDWB today.    Mobility  Bed Mobility Overal bed mobility: Needs Assistance Bed Mobility: Sit to Supine       Sit to supine: Total assist;+2 for physical assistance;+2 for safety/equipment   General bed mobility comments: assist for LLE and trunk.  Transfers Overall transfer level: Needs assistance Equipment used: Rolling walker (2 wheeled) Transfers: Sit to/from [facility] Sit to Stand: Max assist;+2 physical assistance;+2 safety/equipment Stand pivot transfers: +2 physical assistance;Max assist;+2 safety/equipment       General transfer comment: much difficulty placing a small amount of weight on the left leg for short steps. Support provided , cues for posture, tends to lean forward.  Ambulation/Gait                 Stairs            Wheelchair Mobility    Modified Rankin (Stroke Patients Only)       Balance                                            Cognition Arousal/Alertness: Awake/alert                                            Exercises      General Comments        Pertinent Vitals/Pain Faces Pain Scale: Hurts whole lot Pain Location:  left hip Pain Descriptors / Indicators: Aching;Sore;Stabbing Pain Intervention(s): Limited activity within patient's tolerance;Monitored during session;Premedicated before session;Repositioned;Ice applied    Home Living                      Prior Function            PT Goals (current goals can now be found in the care plan section) Progress towards PT goals: Progressing toward goals    Frequency    Min 3X/week      PT Plan Current plan remains appropriate    Co-evaluation              AM-PAC PT "6 Clicks" Daily Activity  Outcome Measure  Difficulty turning over in bed (including adjusting bedclothes, sheets and blankets)?: Unable Difficulty moving from lying on back to sitting on the side of the bed? : Unable Difficulty sitting down on and standing up from a chair with arms (e.g., wheelchair, bedside commode, etc,.)?: Unable Help needed moving to and from a bed to chair (including a wheelchair)?: Total Help needed walking in hospital room?: Total Help needed climbing 3-5 steps  with a railing? : Total 6 Click Score: 6    End of Session   Activity Tolerance: Patient limited by pain Patient left: in bed;with call bell/phone within reach;with family/visitor present Nurse Communication: Mobility status PT Visit Diagnosis: Difficulty in walking, not elsewhere classified (R26.2);Pain Pain - Right/Left: Left Pain - part of body: Hip     Time: 3225-6720 PT Time Calculation (min) (ACUTE ONLY): 43 min  Charges:  $Therapeutic Activity: 38-52 mins                    G Codes:          Claretha Cooper 10/16/2017, 4:10 PM

## 2017-10-16 NOTE — Progress Notes (Signed)
Nurse spoke with Dr. Erlinda Hong concerning IV pepcid. Per Dr. Erlinda Hong, change pepcid IV to oral pepcid. Nurse discontinued IV pepcid and ordered pepcid by mouth.

## 2017-10-16 NOTE — Care Management Important Message (Signed)
Important Message  Patient Details  Name: Laurie Galloway MRN: [id number] Date of Birth: 1930-11-10   Medicare Important Message Given:  Yes    Kerin Salen 10/16/2017, 9:46 AMImportant Message  Patient Details  Name: Laurie Galloway MRN: [id number] Date of Birth: October 29, 1930   Medicare Important Message Given:  Yes    Kerin Salen 10/16/2017, 9:46 AM

## 2017-10-16 NOTE — Evaluation (Signed)
Occupational Therapy Evaluation Patient Details Name: Laurie Galloway MRN: [id number] DOB: [birth year] Today's Date: 10/16/2017    History of Present Illness 83 yo female sustained fall with left Comminuted, complex left peritrochanteric femur on 11/112/18. S/P IM nail .   Clinical Impression   Pt was admitted for the above. 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  Pt currently needs max +2 for bed mobility, mod +2 for sit to stand during adls and up to total A +2 for hygiene.  Will educate on AE to promote independence. Goals in acute are for min to mod A +1. She will benefit from continued OT at SNF to resume her independent PLOF    Follow Up Recommendations  SNF    Equipment Recommendations  3 in 1 bedside commode    Recommendations for Other Services       Precautions / Restrictions Precautions Precautions: Fall Restrictions Weight Bearing Restrictions: Yes LLE Weight Bearing: Partial weight bearing      Mobility Bed Mobility         Supine to sit: Max assist;+2 for physical assistance;+2 for safety/equipment     General bed mobility comments: assist for LLE and trunk. cues for sequence  Transfers   Equipment used: Rolling walker (2 wheeled)   Sit to Stand: Mod assist;+2 physical assistance;+2 safety/equipment         General transfer comment: cues for UE/LE placement    Balance                                           ADL either performed or assessed with clinical judgement   ADL Overall ADL's : Needs assistance/impaired Eating/Feeding: Independent   Grooming: Set up;Sitting   Upper Body Bathing: Set up;Sitting   Lower Body Bathing: Maximal assistance;+2 for physical assistance;Sit to/from stand   Upper Body Dressing : Minimal assistance;Sitting   Lower Body Dressing: Total assistance;+2 for physical assistance;Sit to/from stand   Toilet Transfer: Moderate assistance;+2 for physical  assistance;Stand-pivot;BSC;RW   Toileting- Clothing Manipulation and Hygiene: Total assistance;+2 for physical assistance;Sit to/from stand         General ADL Comments: Performed SPT to [facility] then to chair. Pt had difficulty with directionality and moving non-operated leg back to surface     Vision         Perception     Praxis      Pertinent Vitals/Pain Pain Assessment: Faces Faces Pain Scale: Hurts whole lot Pain Location: left hip Pain Descriptors / Indicators: Aching;Sore Pain Intervention(s): Limited activity within patient's tolerance;Monitored during session;Premedicated before session;Repositioned     Hand Dominance     Extremity/Trunk Assessment Upper Extremity Assessment Upper Extremity Assessment: Generalized weakness(grossly 4-/5)           Communication Communication Communication: No difficulties   Cognition Arousal/Alertness: Awake/alert Behavior During Therapy: WFL for tasks assessed/performed Overall Cognitive Status: Impaired/Different from baseline                                 General Comments: mostly wfls; difficulty with directionality (stepped back when cued to advance leg forward)   General Comments       Exercises     Shoulder Instructions      Home Living Family/patient expects to be discharged to:: Skilled nursing facility Living Arrangements:  Children                               Additional Comments: 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      Prior Functioning/Environment Level of Independence: Independent                 OT Problem List: Decreased strength;Decreased activity tolerance;Impaired balance (sitting and/or standing);Decreased knowledge of use of DME or AE;Pain      OT Treatment/Interventions: Self-care/ADL training;DME and/or AE instruction;Patient/family education;Balance training    OT Goals(Current goals can be found in the care plan section) Acute Rehab OT  Goals Patient Stated Goal: get better, less pain OT Goal Formulation: With patient Potential to Achieve Goals: Good ADL Goals Pt Will Perform Lower Body Bathing: with min assist;with adaptive equipment;sit to/from stand Pt Will Perform Lower Body Dressing: with mod assist;with adaptive equipment;sit to/from stand Pt Will Transfer to Toilet: with min assist;stand pivot transfer;bedside commode Pt Will Perform Toileting - Clothing Manipulation and hygiene: with mod assist;sit to/from stand  OT Frequency: Min 2X/week   Barriers to D/C:            Co-evaluation              AM-PAC PT "6 Clicks" Daily Activity     Outcome Measure Help from another person eating meals?: None Help from another person taking care of personal grooming?: A Little Help from another person toileting, which includes using toliet, bedpan, or urinal?: A Lot Help from another person bathing (including washing, rinsing, drying)?: A Lot Help from another person to put on and taking off regular upper body clothing?: A Little Help from another person to put on and taking off regular lower body clothing?: A Lot 6 Click Score: 16   End of Session Equipment Utilized During Treatment: Gait belt;Rolling walker  Activity Tolerance: Patient limited by fatigue Patient left: in chair;with call bell/phone within reach;with family/visitor present  OT Visit Diagnosis: Pain Pain - Right/Left: Left Pain - part of body: Hip                Time: 0488-8916 OT Time Calculation (min): 31 min Charges:  OT General Charges $OT Visit: 1 Visit OT Evaluation $OT Eval Low Complexity: 1 Low OT Treatments $Self Care/Home Management : 8-22 mins G-Codes:     [place], OTR/L [phone] 10/16/2017  [name] 10/16/2017, 10:26 AM

## 2017-10-16 NOTE — Progress Notes (Signed)
     Subjective: 2 Days Post-Op Procedure(s) (LRB): INTRAMEDULLARY (IM) NAIL INTERTROCHANTRIC (Left)   Patient reports pain as mild, pain controlled. No events throughout the night.  Working some better with PT than she did yesterday.    Objective:   VITALS:   Vitals:   10/16/17 0612 10/16/17 1104  BP: (!) 156/74 (!) 148/62  Pulse: 74 87  Resp: 16 18  Temp: 97.8 F (36.6 C) 98 F (36.7 C)  SpO2: 96% 96%    Dorsiflexion/Plantar flexion intact Incision: dressing C/D/I No cellulitis present Compartment soft  LABS Recent Labs    10/14/17 0525 10/15/17 0613 10/16/17 0522  HGB 11.8* 10.8* 9.0*  HCT 35.8* 32.1* 27.1*  WBC 10.4 12.1* 10.7*  PLT 329 282 226    Recent Labs    10/14/17 0525 10/15/17 0613 10/16/17 0522  NA 135 136 138  K 4.3 4.2 4.0  BUN 23* 23* 19  CREATININE 0.67 0.64 0.54  GLUCOSE 141* 120* 106*     Assessment/Plan: 2 Days Post-Op Procedure(s) (LRB): INTRAMEDULLARY (IM) NAIL INTERTROCHANTRIC (Left) Up with therapy Discharge disposition to be determined, probably benefit from SNF since not doing well with PT. Plan to change the dressings to Aquacel dressings prior to discharge.    Ortho recommendations:  ASA 81 mg bid for 4 weeks for anticoagulation, unless other medically indicated.  Norco for pain management (Rx written).  Robaxin for muscle spasms (Rx written).  MiraLax and Colace for constipation  Iron 325 mg tid for 2-3 weeks   PWB 50%  on the left leg.  Dressing to remain in place until follow in clinic in 2 weeks.  Dressing is waterproof and may shower with it in place.  Follow up in 2 weeks at [facility]. Follow up with OLIN,Tanda Morrissey D in 2 weeks.  Contact information:  [facility] [address], [address] [place] Talya Quain   PAC  10/16/2017, 11:40 AM

## 2017-10-16 NOTE — Progress Notes (Signed)
Patient and daughter agreeable to [facility]. Facility aware of patient choice and has started authorization.   Kathrin Greathouse, Latanya Presser, MSW Clinical Social Worker  [phone] 10/16/2017  1:15 PM

## 2017-10-17 DIAGNOSIS — S72009A Fracture of unspecified part of neck of unspecified femur, initial encounter for closed fracture: Secondary | ICD-10-CM | POA: Diagnosis not present

## 2017-10-17 DIAGNOSIS — Z9621 Cochlear implant status: Secondary | ICD-10-CM

## 2017-10-17 DIAGNOSIS — S72002D Fracture of unspecified part of neck of left femur, subsequent encounter for closed fracture with routine healing: Secondary | ICD-10-CM | POA: Diagnosis not present

## 2017-10-17 DIAGNOSIS — K5909 Other constipation: Secondary | ICD-10-CM | POA: Diagnosis not present

## 2017-10-17 DIAGNOSIS — I82403 Acute embolism and thrombosis of unspecified deep veins of lower extremity, bilateral: Secondary | ICD-10-CM | POA: Diagnosis not present

## 2017-10-17 DIAGNOSIS — S72002A Fracture of unspecified part of neck of left femur, initial encounter for closed fracture: Secondary | ICD-10-CM | POA: Diagnosis not present

## 2017-10-17 DIAGNOSIS — R278 Other lack of coordination: Secondary | ICD-10-CM | POA: Diagnosis not present

## 2017-10-17 DIAGNOSIS — M6281 Muscle weakness (generalized): Secondary | ICD-10-CM | POA: Diagnosis not present

## 2017-10-17 DIAGNOSIS — S8991XA Unspecified injury of right lower leg, initial encounter: Secondary | ICD-10-CM | POA: Diagnosis not present

## 2017-10-17 DIAGNOSIS — R41841 Cognitive communication deficit: Secondary | ICD-10-CM | POA: Diagnosis not present

## 2017-10-17 DIAGNOSIS — S72142D Displaced intertrochanteric fracture of left femur, subsequent encounter for closed fracture with routine healing: Secondary | ICD-10-CM | POA: Diagnosis not present

## 2017-10-17 DIAGNOSIS — D6489 Other specified anemias: Secondary | ICD-10-CM | POA: Diagnosis not present

## 2017-10-17 DIAGNOSIS — M62838 Other muscle spasm: Secondary | ICD-10-CM | POA: Diagnosis not present

## 2017-10-17 DIAGNOSIS — I10 Essential (primary) hypertension: Secondary | ICD-10-CM | POA: Diagnosis not present

## 2017-10-17 DIAGNOSIS — M810 Age-related osteoporosis without current pathological fracture: Secondary | ICD-10-CM | POA: Diagnosis not present

## 2017-10-17 DIAGNOSIS — M545 Low back pain: Secondary | ICD-10-CM | POA: Diagnosis not present

## 2017-10-17 DIAGNOSIS — R2681 Unsteadiness on feet: Secondary | ICD-10-CM | POA: Diagnosis not present

## 2017-10-17 DIAGNOSIS — S20219A Contusion of unspecified front wall of thorax, initial encounter: Secondary | ICD-10-CM | POA: Diagnosis not present

## 2017-10-17 DIAGNOSIS — M25552 Pain in left hip: Secondary | ICD-10-CM | POA: Diagnosis not present

## 2017-10-17 LAB — CBC
HCT: 27.7 % — ABNORMAL LOW (ref 36.0–46.0)
Hemoglobin: 9.1 g/dL — ABNORMAL LOW (ref 12.0–15.0)
MCH: 31.3 pg (ref 26.0–34.0)
MCHC: 32.9 g/dL (ref 30.0–36.0)
MCV: 95.2 fL (ref 78.0–100.0)
Platelets: 241 10*3/uL (ref 150–400)
RBC: 2.91 MIL/uL — ABNORMAL LOW (ref 3.87–5.11)
RDW: 14.5 % (ref 11.5–15.5)
WBC: 10.7 10*3/uL — ABNORMAL HIGH (ref 4.0–10.5)

## 2017-10-17 LAB — BASIC METABOLIC PANEL
Anion gap: 4 — ABNORMAL LOW (ref 5–15)
BUN: 19 mg/dL (ref 6–20)
CO2: 29 mmol/L (ref 22–32)
Calcium: 7.8 mg/dL — ABNORMAL LOW (ref 8.9–10.3)
Chloride: 105 mmol/L (ref 101–111)
Creatinine, Ser: 0.57 mg/dL (ref 0.44–1.00)
GFR calc Af Amer: >60 mL/min (ref >60)
GFR calc non Af Amer: >60 mL/min (ref >60)
Glucose, Bld: 106 mg/dL — ABNORMAL HIGH (ref 65–99)
Potassium: 3.9 mmol/L (ref 3.5–5.1)
Sodium: 138 mmol/L (ref 135–145)

## 2017-10-17 MED ORDER — FERROUS SULFATE 325 (65 FE) MG PO TABS: 325.0000 mg | ORAL_TABLET | Freq: Every day | ORAL | 0 refills | Status: DC

## 2017-10-17 MED ORDER — POLYETHYLENE GLYCOL 3350 17 G PO PACK: 17.0000 g | PACK | Freq: Every day | ORAL | 0 refills | Status: DC

## 2017-10-17 MED ORDER — BISACODYL 10 MG RE SUPP: 10.0000 mg | Freq: Every day | RECTAL | 0 refills | Status: DC | PRN

## 2017-10-17 MED ORDER — SENNOSIDES-DOCUSATE SODIUM 8.6-50 MG PO TABS: 1.0000 | ORAL_TABLET | Freq: Two times a day (BID) | ORAL | 0 refills | Status: DC

## 2017-10-17 NOTE — Plan of Care (Signed)
Plan of care discussed and reviewed with patient.  Denies any questions at this time.

## 2017-10-17 NOTE — Progress Notes (Signed)
Called report to [place] in [place] to [employer]. No questions. Provided direct number in case nurse had any questions.

## 2017-10-17 NOTE — Progress Notes (Signed)
Physical Therapy Treatment Patient Details Name: Laurie Galloway MRN: [id number] DOB: 29-Aug-1930 Today's Date: 10/17/2017    History of Present Illness 83 yo female sustained fall with left Comminuted, complex left peritrochanteric femur on 11/112/18. S/P IM nail .    PT Comments    Required max assist +2 for safety and equipment. Required mod assist with L LE during bed mobility, with max assist to stand from elevated surface. Required 50% VC's for hand placement and management of LE's. Ambulated 6 feet in bilateral platform EVA walker, requiring tactile cues to progress L LE forward. Positioned in recliner with pillows for pressure relief.    Follow Up Recommendations  SNF     Equipment Recommendations  Rolling walker with 5" wheels    Recommendations for Other Services       Precautions / Restrictions Precautions Precautions: Fall Restrictions Weight Bearing Restrictions: Yes LLE Weight Bearing: Partial weight bearing LLE Partial Weight Bearing Percentage or Pounds: 50    Mobility  Bed Mobility Overal bed mobility: Needs Assistance Bed Mobility: Sit to Supine     Supine to sit: +2 for physical assistance;+2 for safety/equipment;Mod assist     General bed mobility comments: assist for LLE and trunk  Transfers Overall transfer level: Needs assistance Equipment used: Rolling walker (2 wheeled) Transfers: Sit to/from Stand Sit to Stand: Max assist;+2 physical assistance;+2 safety/equipment Stand pivot transfers: +2 physical assistance;Max assist;+2 safety/equipment       General transfer comment: elevated surface, in bilateral platform walker, difficulty placing small amount of weight on the L LE for support.   Ambulation/Gait Ambulation/Gait assistance: +2 physical assistance;+2 safety/equipment Ambulation Distance (Feet): 6 Feet Assistive device: Bilateral platform walker Gait Pattern/deviations: Decreased stance time - left;Decreased step length -  left;Decreased stride length;Step-to pattern;Decreased weight shift to left;Trunk flexed;Narrow base of support Gait velocity: decreased Gait velocity interpretation: Below normal speed for age/gender General Gait Details: required tactile cueing to progress L LE forward, VC's required for wb through the R LE and to keep the L LE extended at the knee.    Stairs            Wheelchair Mobility    Modified Rankin (Stroke Patients Only)       Balance                                            Cognition Arousal/Alertness: Awake/alert Behavior During Therapy: WFL for tasks assessed/performed Overall Cognitive Status: Within Functional Limits for tasks assessed                                        Exercises      General Comments        Pertinent Vitals/Pain Pain Assessment: 0-10 Pain Score: 8  Pain Location: left hip Pain Descriptors / Indicators: Aching;Sore;Stabbing Pain Intervention(s): Limited activity within patient's tolerance;Monitored during session;Repositioned;Ice applied    Home Living                      Prior Function            PT Goals (current goals can now be found in the care plan section)      Frequency    Min 3X/week      PT Plan Current plan  remains appropriate    Co-evaluation              AM-PAC PT "6 Clicks" Daily Activity  Outcome Measure  Difficulty turning over in bed (including adjusting bedclothes, sheets and blankets)?: Unable Difficulty moving from lying on back to sitting on the side of the bed? : Unable Difficulty sitting down on and standing up from a chair with arms (e.g., wheelchair, bedside commode, etc,.)?: Unable Help needed moving to and from a bed to chair (including a wheelchair)?: Total Help needed walking in hospital room?: Total Help needed climbing 3-5 steps with a railing? : Total 6 Click Score: 6    End of Session Equipment Utilized During  Treatment: (bilateral platform walker) Activity Tolerance: Patient limited by pain Patient left: with call bell/phone within reach;in chair Nurse Communication: Mobility status PT Visit Diagnosis: Difficulty in walking, not elsewhere classified (R26.2);Pain Pain - Right/Left: Left Pain - part of body: Hip     Time: 3295-1884 PT Time Calculation (min) (ACUTE ONLY): 25 min  Charges:  $Gait Training: 8-22 mins                    G Codes:       Almond Lint, SPTA [facility]  PTA [facility]  Acute  Rehab Pager      [phone]

## 2017-10-17 NOTE — Clinical Social Work Placement (Addendum)
1:44pm [facility] called for transport.  Patient daughter plans to meet her at the facility.  Nurse given number to call report.   CLINICAL SOCIAL WORK PLACEMENT  NOTE  Date:  10/17/2017  Patient Details  Name: Laurie Galloway MRN: [id number] Date of Birth: [birth year]  Clinical Social Work is seeking post-discharge placement for this patient at the [place] level of care (*CSW will initial, date and re-position this form in  chart as items are completed):  Yes   Patient/family provided with [facility] Work Department's list of facilities offering this level of care within the geographic area requested by the patient (or if unable, by the patient's family).  Yes   Patient/family informed of their freedom to choose among providers that offer the needed level of care, that participate in Medicare, Medicaid or managed care program needed by the patient, have an available bed and are willing to accept the patient.  Yes   Patient/family informed of [facility]'s ownership interest in [facility] and [facility], as well as of the fact that they are under no obligation to receive care at these facilities.  PASRR submitted to EDS on 10/17/17( [id number]  )     PASRR number received on 10/17/17     Existing PASRR number confirmed on       FL2 transmitted to all facilities in geographic area requested by pt/family on       FL2 transmitted to all facilities within larger geographic area on 10/14/17     Patient informed that his/her managed care company has contracts with or will negotiate with certain facilities, including the following:        Yes   Patient/family informed of bed offers received.  Patient chooses bed at [place], [place]     Physician recommends and patient chooses bed at      Patient to be transferred to [facility] on 10/17/17.  Patient to be transferred to facility by [facility]     Patient family notified on 10/17/17  of transfer.  Name of family member notified:  Daughter-Anette     PHYSICIAN Please sign DNR     Additional Comment:    _______________________________________________ Lia Hopping, LCSW 10/17/2017, 10:38 AM

## 2017-10-17 NOTE — Discharge Summary (Signed)
Discharge Summary  Laurie Galloway PNT:[id number] DOB: 1930/04/15  PCP: Mayra Neer, MD  Admit date: 10/13/2017 Discharge date: 10/17/2017  Time spent: >1mins  Recommendations for Outpatient Follow-up:  1. F/u with PMD within a week  for hospital discharge follow up, repeat cbc/bmp at follow up  Discharge Diagnoses:  Active Hospital Problems   Diagnosis Date Noted  . Closed left hip fracture, initial encounter ([place]) 10/13/2017  . Hip fracture ([place]) 10/13/2017  . Cochlear implant in place 10/13/2017  . Subclinical hyperthyroidism 06/18/2013  . HTN (hypertension) 01/07/2012    Resolved Hospital Problems  No resolved problems to display.    Discharge Condition: stable  Diet recommendation: heart healthy/carb modified  Filed Weights   10/13/17 2021  Weight: 64 kg (141 lb)    History of present illness: (per admitting MD  Dr Roel Cluck) PCP: Mayra Neer, MD   Outpatient Specialists: Dr. Fredna Dow and Dr. Annette Stable       Patient coming from:    home Lives  With family    Chief Complaint: Left Hip Pain after a fall  HPI: Laurie Galloway is a 83 y.o. female with medical history significant of HTN hypothyroidism  Small bowel obstruction  Presented with a fall that occurred today while walking down the stairs and carrying things in her hands she accidentally missed step and fell down the stairs denies hitting her head. She fell onto her left side and experienced severe pain and was unable to stand up. Patient was able to call her family who called EMS for her. She did water aerobics on regular bases no chest pain or shortness of breath able to walk up a flight of stairs.    Regarding pertinent Chronic problems: History of hypertension Couple tunnel syndrome has been seen last year by [facility] Dr. Fredna Dow and Dr. Annette Stable in the past.  Denies hx of CAD.   IN ER:  Temp (24hrs), Avg:97.9 F (36.6 C), Min:97.9 F (36.6 C), Max:97.9 F (36.6 C)      on arrival        ED Triage Vitals  Enc Vitals Group     BP 10/13/17 1532 (!) 182/150     Pulse Rate 10/13/17 1532 83     Resp 10/13/17 1532 18     Temp 10/13/17 1532 97.9 F (36.6 C)     Temp Source 10/13/17 1532 Oral     SpO2 10/13/17 1530 97 %     Weight --      Height --      Head Circumference --      Peak Flow --      Pain Score 10/13/17 1835 10     Pain Loc --      Pain Edu? --      Excl. in [place]? --     Latest RR 18 90% BP 136/64 Na 139 K 4.0 BUN 21 Cr 0.75 WBC 10.8 Hg 13.7 PLT 335 INR 1.0 Left Hip: Comminuted intertrochanteric fracture of the left femur  CXR; Non acute Following Medications were ordered in ER: Medications  morphine 4 MG/ML injection 4 mg (4 mg Intravenous Given 10/13/17 1834)  0.9 %  sodium chloride infusion ( Intravenous New Bag/Given 10/13/17 1632)  ondansetron (ZOFRAN) injection 4 mg (4 mg Intravenous Given 10/13/17 1632)     ER provider discussed case with:  Alvan Dame Who recommends: NPO post midnight admit to [facility] for possible OR time tomorrow We'll see patient in consult in the morning  Hospitalist was called for admission for Left hip frx due to mechanical fall     Hospital Course:  Active Problems:   HTN (hypertension)   Subclinical hyperthyroidism   Closed left hip fracture, initial encounter ([place])   Hip fracture (HCC)   Cochlear implant in place   Mechanical fall with left hip fracture:  -S/p INTRAMEDULLARY (IM) NAIL INTERTROCHANTRIC (Left)on 11/13 -Wound care, pain control, activity level, DVT prophylaxis per ortho    Ortho recommendations:  ASA 81 mg bid for 4 weeks for anticoagulation, unless other medically indicated.  Norco for pain management (Rx written).  Robaxin for muscle spasms(Rx written).  MiraLax and Colace for constipation  Iron 325 mg tid for 2-3 weeks   PWB50%on the leftleg.  Dressing to remain in place until follow in clinic in 2 weeks.  Dressing is waterproof and may shower with it in  place.  Follow up in 2 weeks at [facility]. Follow up with OLIN,MATTHEW D in 2 weeks.  Contact information:  [facility] [address] [place] [phone]    HTN;  She was briefly on iv metop perioepratively Now able to take oral meds, resume home  home meds losartan, verapamil  Constipation: stool softener   FTT:  Chronic back pain radiating down to right leg, now s/p left hip surgery SNF placement. Will need to follow with neurospine surgeon and orthopedic surgeon.   Code Status: DNR  Family Communication: patient and daughter at bedside  Disposition Plan: SNF    Consultants:  orthopedics  Procedures: INTRAMEDULLARY (IM) NAIL INTERTROCHANTRIC (Left)on 11/13 by orthopedics Dr Alvan Dame    Antibiotics:  Perioperative ancef   Discharge Exam: BP (!) 150/64   Pulse 75   Temp 97.9 F (36.6 C) (Oral)   Resp 16   Ht 5\' 3"  (1.6 m)   Wt 64 kg (141 lb)   SpO2 95%   BMI 24.98 kg/m   General: NAD Cardiovascular: RRR Respiratory: CTABL Musculoskeletal: left hip post op changes, dressing intact, neurovascular intact distally     Discharge Instructions You were cared for by a hospitalist during your hospital stay. If you have any questions about your discharge medications or the care you received while you were in the hospital after you are discharged, you can call the unit and asked to speak with the hospitalist on call if the hospitalist that took care of you is not available. Once you are discharged, your primary care physician will handle any further medical issues. Please note that NO REFILLS for any discharge medications will be authorized once you are discharged, as it is imperative that you return to your primary care physician (or establish a relationship with a primary care physician if you do not have one) for your aftercare needs so that they can reassess your need for  medications and monitor your lab values.  Discharge Instructions    Diet - low sodium heart healthy   Complete by:  As directed    Increase activity slowly   Complete by:  As directed      Allergies as of 10/17/2017      Reactions   Penicillins Rash   Sulfa Antibiotics Rash   Sulfacetamide Sodium Rash      Medication List    TAKE these medications   alendronate 70 MG tablet Commonly known as:  FOSAMAX Take 70 mg by mouth once a week. Take with a full glass of water on an empty stomach.   aspirin  81 MG chewable tablet Commonly known as:  ASPIRIN CHILDRENS Chew 1 tablet (81 mg total) 2 (two) times daily by mouth.   betamethasone valerate 0.1 % cream Commonly known as:  VALISONE Apply 1 application daily topically.   bisacodyl 10 MG suppository Commonly known as:  DULCOLAX Place 1 suppository (10 mg total) daily as needed rectally for moderate constipation.   CALCIUM-VITAMIN D PO Take by mouth daily.   ferrous sulfate 325 (65 FE) MG tablet Take 1 tablet (325 mg total) daily with breakfast by mouth. Start taking on:  10/18/2017   FISH OIL PO Take 1,000 mg by mouth.   GLUCOSAMINE CHONDROITIN TRIPLE PO Take by mouth daily.   HYDROcodone-acetaminophen 5-325 MG tablet Commonly known as:  NORCO/VICODIN Take 1 tablet every 4 (four) hours as needed by mouth for moderate pain. What changed:    how much to take  how to take this  when to take this  reasons to take this  additional instructions   losartan 100 MG tablet Commonly known as:  COZAAR Take 100 mg by mouth every morning.   meloxicam 7.5 MG tablet Commonly known as:  MOBIC Take 7.5 mg by mouth 2 (two) times daily.   methocarbamol 500 MG tablet Commonly known as:  ROBAXIN Take 1 tablet (500 mg total) every 6 (six) hours as needed by mouth for muscle spasms.   multivitamin tablet Take 1 tablet by mouth daily.   polyethylene glycol packet Commonly known as:  MIRALAX / GLYCOLAX Take 17 g daily by  mouth. Start taking on:  10/18/2017   PRESERVISION AREDS 2 PO Take 1 tablet by mouth 2 (two) times daily.   senna-docusate 8.6-50 MG tablet Commonly known as:  Senokot-S Take 1 tablet 2 (two) times daily by mouth.   SYSTANE 0.4-0.3 % Soln Generic drug:  Polyethyl Glycol-Propyl Glycol Place 1 drop into both eyes 4 (four) times daily.   verapamil 360 MG 24 hr capsule Commonly known as:  VERELAN PM Take 360 mg by mouth every morning.   Vitamin D2 2000 units Tabs Take 1 tablet by mouth daily.      Allergies  Allergen Reactions  . Penicillins Rash  . Sulfa Antibiotics Rash  . Sulfacetamide Sodium Rash    Contact information for follow-up providers    Paralee Cancel, MD. Schedule an appointment as soon as possible for a visit in 2 week(s).   Specialty:  Orthopedic Surgery Contact information: [address] [place] [address] [phone]        Mayra Neer, MD Follow up in 2 week(s).   Specialty:  Family Medicine Why:  hospital discharge follow up Contact information: [address] [place] [address] [phone]            Contact information for after-discharge care    Destination    [facility] Follow up.   Service:  Skilled Nursing Contact information: [place] [phone]                   The results of significant diagnostics from this hospitalization (including imaging, microbiology, ancillary and laboratory) are listed below for reference.    Significant Diagnostic Studies: Dg Chest 1 View  Result Date: 10/13/2017 CLINICAL DATA:  Preoperative exam for left hip fracture. EXAM: CHEST 1 VIEW COMPARISON:  Chest x-ray dated February 18, 2013. FINDINGS: The cardiomediastinal silhouette is normal in size. Normal pulmonary vascularity. No focal consolidation, pleural effusion,  or pneumothorax. No acute osseous abnormality. IMPRESSION: No  active disease. Electronically Signed   By: Titus Dubin M.D.   On: 10/13/2017 17:25   Dg C-arm 1-60 Min-no Report  Result Date: 10/14/2017 Fluoroscopy was utilized by the requesting physician.  No radiographic interpretation.   Dg Hip Unilat W Or Wo Pelvis 2-3 Views Left  Result Date: 10/13/2017 CLINICAL DATA:  Fall EXAM: DG HIP (WITH OR WITHOUT PELVIS) 2-3V LEFT COMPARISON:  None. FINDINGS: There is an intertrochanteric fracture of the proximal left femur that is comminuted with moderate overriding. The femoral head remains situated in the acetabulum. No pelvic fracture or diastasis. There is at least moderate right hip osteoarthrosis. IMPRESSION: Comminuted intertrochanteric fracture of the left femur without femoroacetabular dislocation. Electronically Signed   By: Ulyses Jarred M.D.   On: 10/13/2017 17:20   Dg Femur Min 2 Views Left  Result Date: 10/14/2017 CLINICAL DATA:  Intraoperative imaging for left-sided femoral IM nail placement due to hip fracture. EXAM: LEFT FEMUR 2 VIEWS; DG C-ARM 1-60 MIN-NO REPORT COMPARISON:  Radiographs from 10/13/2017 FINDINGS: Left femoral IM nail traverses the intertrochanteric fracture and demonstrates good positioning. The distal extent of the inter trochanteric nail was not included on these images. IMPRESSION: 1. Proximal portion of left femoral IM nail traverses the intratrochanteric fracture and appears appropriately positioned. Electronically Signed   By: Van Clines M.D.   On: 10/14/2017 19:01    Microbiology: No results found for this or any previous visit (from the past 240 hour(s)).   Labs: Basic Metabolic Panel: Recent Labs  Lab 10/13/17 1636 10/14/17 0525 10/15/17 0613 10/16/17 0522 10/17/17 0524  NA 139 135 136 138 138  K 4.0 4.3 4.2 4.0 3.9  CL 103 102 106 106 105  CO2 27 28 25 28 29   GLUCOSE 118* 141* 120* 106* 106*  BUN 21* 23* 23* 19 19  CREATININE 0.75 0.67 0.64 0.54 0.57  CALCIUM 9.4 8.5* 8.1* 7.8* 7.8*    Liver Function Tests: Recent Labs  Lab 10/14/17 0525  ALBUMIN 3.5   No results for input(s): LIPASE, AMYLASE in the last 168 hours. No results for input(s): AMMONIA in the last 168 hours. CBC: Recent Labs  Lab 10/13/17 1636 10/14/17 0525 10/15/17 0613 10/16/17 0522 10/17/17 0524  WBC 10.8* 10.4 12.1* 10.7* 10.7*  NEUTROABS 8.8*  --   --   --   --   HGB 13.7 11.8* 10.8* 9.0* 9.1*  HCT 41.1 35.8* 32.1* 27.1* 27.7*  MCV 93.6 93.2 94.1 95.1 95.2  PLT 335 329 282 226 241   Cardiac Enzymes: No results for input(s): CKTOTAL, CKMB, CKMBINDEX, TROPONINI in the last 168 hours. BNP: BNP (last 3 results) No results for input(s): BNP in the last 8760 hours.  ProBNP (last 3 results) No results for input(s): PROBNP in the last 8760 hours.  CBG: No results for input(s): GLUCAP in the last 168 hours.     Signed:  Florencia Reasons MD, PhD  [facility] 10/17/2017, 12:14 PM

## 2017-10-19 DIAGNOSIS — M545 Low back pain: Secondary | ICD-10-CM | POA: Diagnosis not present

## 2017-10-19 DIAGNOSIS — I82403 Acute embolism and thrombosis of unspecified deep veins of lower extremity, bilateral: Secondary | ICD-10-CM | POA: Diagnosis not present

## 2017-10-19 DIAGNOSIS — S72142D Displaced intertrochanteric fracture of left femur, subsequent encounter for closed fracture with routine healing: Secondary | ICD-10-CM | POA: Diagnosis not present

## 2017-10-19 DIAGNOSIS — M810 Age-related osteoporosis without current pathological fracture: Secondary | ICD-10-CM | POA: Diagnosis not present

## 2017-10-19 DIAGNOSIS — K5909 Other constipation: Secondary | ICD-10-CM | POA: Diagnosis not present

## 2017-10-19 DIAGNOSIS — D6489 Other specified anemias: Secondary | ICD-10-CM | POA: Diagnosis not present

## 2017-10-29 DIAGNOSIS — S72142D Displaced intertrochanteric fracture of left femur, subsequent encounter for closed fracture with routine healing: Secondary | ICD-10-CM | POA: Diagnosis not present

## 2017-11-02 DIAGNOSIS — S20219A Contusion of unspecified front wall of thorax, initial encounter: Secondary | ICD-10-CM | POA: Diagnosis not present

## 2017-11-02 DIAGNOSIS — S72142D Displaced intertrochanteric fracture of left femur, subsequent encounter for closed fracture with routine healing: Secondary | ICD-10-CM | POA: Diagnosis not present

## 2017-11-12 DIAGNOSIS — I10 Essential (primary) hypertension: Secondary | ICD-10-CM | POA: Diagnosis not present

## 2017-11-12 DIAGNOSIS — G8929 Other chronic pain: Secondary | ICD-10-CM | POA: Diagnosis not present

## 2017-11-12 DIAGNOSIS — W19XXXD Unspecified fall, subsequent encounter: Secondary | ICD-10-CM | POA: Diagnosis not present

## 2017-11-12 DIAGNOSIS — S72142D Displaced intertrochanteric fracture of left femur, subsequent encounter for closed fracture with routine healing: Secondary | ICD-10-CM | POA: Diagnosis not present

## 2017-11-12 DIAGNOSIS — M545 Low back pain: Secondary | ICD-10-CM | POA: Diagnosis not present

## 2017-11-12 DIAGNOSIS — Z7901 Long term (current) use of anticoagulants: Secondary | ICD-10-CM | POA: Diagnosis not present

## 2017-11-13 DIAGNOSIS — S72142D Displaced intertrochanteric fracture of left femur, subsequent encounter for closed fracture with routine healing: Secondary | ICD-10-CM | POA: Diagnosis not present

## 2017-11-13 DIAGNOSIS — M25552 Pain in left hip: Secondary | ICD-10-CM | POA: Diagnosis not present

## 2017-11-14 DIAGNOSIS — S72142D Displaced intertrochanteric fracture of left femur, subsequent encounter for closed fracture with routine healing: Secondary | ICD-10-CM | POA: Diagnosis not present

## 2017-11-14 DIAGNOSIS — I10 Essential (primary) hypertension: Secondary | ICD-10-CM | POA: Diagnosis not present

## 2017-11-14 DIAGNOSIS — M545 Low back pain: Secondary | ICD-10-CM | POA: Diagnosis not present

## 2017-11-14 DIAGNOSIS — Z7901 Long term (current) use of anticoagulants: Secondary | ICD-10-CM | POA: Diagnosis not present

## 2017-11-14 DIAGNOSIS — G8929 Other chronic pain: Secondary | ICD-10-CM | POA: Diagnosis not present

## 2017-11-14 DIAGNOSIS — W19XXXD Unspecified fall, subsequent encounter: Secondary | ICD-10-CM | POA: Diagnosis not present

## 2017-11-17 DIAGNOSIS — Z7901 Long term (current) use of anticoagulants: Secondary | ICD-10-CM | POA: Diagnosis not present

## 2017-11-17 DIAGNOSIS — G8929 Other chronic pain: Secondary | ICD-10-CM | POA: Diagnosis not present

## 2017-11-17 DIAGNOSIS — W19XXXD Unspecified fall, subsequent encounter: Secondary | ICD-10-CM | POA: Diagnosis not present

## 2017-11-17 DIAGNOSIS — S72142D Displaced intertrochanteric fracture of left femur, subsequent encounter for closed fracture with routine healing: Secondary | ICD-10-CM | POA: Diagnosis not present

## 2017-11-17 DIAGNOSIS — I10 Essential (primary) hypertension: Secondary | ICD-10-CM | POA: Diagnosis not present

## 2017-11-17 DIAGNOSIS — M545 Low back pain: Secondary | ICD-10-CM | POA: Diagnosis not present

## 2017-11-19 DIAGNOSIS — M810 Age-related osteoporosis without current pathological fracture: Secondary | ICD-10-CM | POA: Diagnosis not present

## 2017-11-19 DIAGNOSIS — Z9889 Other specified postprocedural states: Secondary | ICD-10-CM | POA: Diagnosis not present

## 2017-11-19 DIAGNOSIS — E0590 Thyrotoxicosis, unspecified without thyrotoxic crisis or storm: Secondary | ICD-10-CM | POA: Diagnosis not present

## 2017-11-19 DIAGNOSIS — M80059A Age-related osteoporosis with current pathological fracture, unspecified femur, initial encounter for fracture: Secondary | ICD-10-CM | POA: Diagnosis not present

## 2017-11-19 DIAGNOSIS — I10 Essential (primary) hypertension: Secondary | ICD-10-CM | POA: Diagnosis not present

## 2017-11-21 DIAGNOSIS — W19XXXD Unspecified fall, subsequent encounter: Secondary | ICD-10-CM | POA: Diagnosis not present

## 2017-11-21 DIAGNOSIS — M545 Low back pain: Secondary | ICD-10-CM | POA: Diagnosis not present

## 2017-11-21 DIAGNOSIS — G8929 Other chronic pain: Secondary | ICD-10-CM | POA: Diagnosis not present

## 2017-11-21 DIAGNOSIS — S72142D Displaced intertrochanteric fracture of left femur, subsequent encounter for closed fracture with routine healing: Secondary | ICD-10-CM | POA: Diagnosis not present

## 2017-11-21 DIAGNOSIS — I10 Essential (primary) hypertension: Secondary | ICD-10-CM | POA: Diagnosis not present

## 2017-11-21 DIAGNOSIS — Z7901 Long term (current) use of anticoagulants: Secondary | ICD-10-CM | POA: Diagnosis not present

## 2017-11-26 DIAGNOSIS — W19XXXD Unspecified fall, subsequent encounter: Secondary | ICD-10-CM | POA: Diagnosis not present

## 2017-11-26 DIAGNOSIS — G8929 Other chronic pain: Secondary | ICD-10-CM | POA: Diagnosis not present

## 2017-11-26 DIAGNOSIS — Z7901 Long term (current) use of anticoagulants: Secondary | ICD-10-CM | POA: Diagnosis not present

## 2017-11-26 DIAGNOSIS — M545 Low back pain: Secondary | ICD-10-CM | POA: Diagnosis not present

## 2017-11-26 DIAGNOSIS — I10 Essential (primary) hypertension: Secondary | ICD-10-CM | POA: Diagnosis not present

## 2017-11-26 DIAGNOSIS — S72142D Displaced intertrochanteric fracture of left femur, subsequent encounter for closed fracture with routine healing: Secondary | ICD-10-CM | POA: Diagnosis not present

## 2017-11-27 DIAGNOSIS — I10 Essential (primary) hypertension: Secondary | ICD-10-CM | POA: Diagnosis not present

## 2017-11-27 DIAGNOSIS — Z7901 Long term (current) use of anticoagulants: Secondary | ICD-10-CM | POA: Diagnosis not present

## 2017-11-27 DIAGNOSIS — G8929 Other chronic pain: Secondary | ICD-10-CM | POA: Diagnosis not present

## 2017-11-27 DIAGNOSIS — M545 Low back pain: Secondary | ICD-10-CM | POA: Diagnosis not present

## 2017-11-27 DIAGNOSIS — S72142D Displaced intertrochanteric fracture of left femur, subsequent encounter for closed fracture with routine healing: Secondary | ICD-10-CM | POA: Diagnosis not present

## 2017-11-27 DIAGNOSIS — W19XXXD Unspecified fall, subsequent encounter: Secondary | ICD-10-CM | POA: Diagnosis not present

## 2017-11-28 DIAGNOSIS — S72142D Displaced intertrochanteric fracture of left femur, subsequent encounter for closed fracture with routine healing: Secondary | ICD-10-CM | POA: Diagnosis not present

## 2017-11-28 DIAGNOSIS — M1711 Unilateral primary osteoarthritis, right knee: Secondary | ICD-10-CM | POA: Diagnosis not present

## 2017-12-01 DIAGNOSIS — G8929 Other chronic pain: Secondary | ICD-10-CM | POA: Diagnosis not present

## 2017-12-01 DIAGNOSIS — S72142D Displaced intertrochanteric fracture of left femur, subsequent encounter for closed fracture with routine healing: Secondary | ICD-10-CM | POA: Diagnosis not present

## 2017-12-01 DIAGNOSIS — W19XXXD Unspecified fall, subsequent encounter: Secondary | ICD-10-CM | POA: Diagnosis not present

## 2017-12-01 DIAGNOSIS — Z7901 Long term (current) use of anticoagulants: Secondary | ICD-10-CM | POA: Diagnosis not present

## 2017-12-01 DIAGNOSIS — I10 Essential (primary) hypertension: Secondary | ICD-10-CM | POA: Diagnosis not present

## 2017-12-01 DIAGNOSIS — M545 Low back pain: Secondary | ICD-10-CM | POA: Diagnosis not present

## 2017-12-03 DIAGNOSIS — S72142D Displaced intertrochanteric fracture of left femur, subsequent encounter for closed fracture with routine healing: Secondary | ICD-10-CM | POA: Diagnosis not present

## 2017-12-03 DIAGNOSIS — Z7901 Long term (current) use of anticoagulants: Secondary | ICD-10-CM | POA: Diagnosis not present

## 2017-12-03 DIAGNOSIS — M545 Low back pain: Secondary | ICD-10-CM | POA: Diagnosis not present

## 2017-12-03 DIAGNOSIS — I10 Essential (primary) hypertension: Secondary | ICD-10-CM | POA: Diagnosis not present

## 2017-12-03 DIAGNOSIS — G8929 Other chronic pain: Secondary | ICD-10-CM | POA: Diagnosis not present

## 2017-12-03 DIAGNOSIS — W19XXXD Unspecified fall, subsequent encounter: Secondary | ICD-10-CM | POA: Diagnosis not present

## 2017-12-05 DIAGNOSIS — I10 Essential (primary) hypertension: Secondary | ICD-10-CM | POA: Diagnosis not present

## 2017-12-05 DIAGNOSIS — G8929 Other chronic pain: Secondary | ICD-10-CM | POA: Diagnosis not present

## 2017-12-05 DIAGNOSIS — S72142D Displaced intertrochanteric fracture of left femur, subsequent encounter for closed fracture with routine healing: Secondary | ICD-10-CM | POA: Diagnosis not present

## 2017-12-05 DIAGNOSIS — Z7901 Long term (current) use of anticoagulants: Secondary | ICD-10-CM | POA: Diagnosis not present

## 2017-12-05 DIAGNOSIS — W19XXXD Unspecified fall, subsequent encounter: Secondary | ICD-10-CM | POA: Diagnosis not present

## 2017-12-05 DIAGNOSIS — M545 Low back pain: Secondary | ICD-10-CM | POA: Diagnosis not present

## 2017-12-08 DIAGNOSIS — I10 Essential (primary) hypertension: Secondary | ICD-10-CM | POA: Diagnosis not present

## 2017-12-08 DIAGNOSIS — Z7901 Long term (current) use of anticoagulants: Secondary | ICD-10-CM | POA: Diagnosis not present

## 2017-12-08 DIAGNOSIS — G8929 Other chronic pain: Secondary | ICD-10-CM | POA: Diagnosis not present

## 2017-12-08 DIAGNOSIS — W19XXXD Unspecified fall, subsequent encounter: Secondary | ICD-10-CM | POA: Diagnosis not present

## 2017-12-08 DIAGNOSIS — S72142D Displaced intertrochanteric fracture of left femur, subsequent encounter for closed fracture with routine healing: Secondary | ICD-10-CM | POA: Diagnosis not present

## 2017-12-08 DIAGNOSIS — M545 Low back pain: Secondary | ICD-10-CM | POA: Diagnosis not present

## 2017-12-10 DIAGNOSIS — I10 Essential (primary) hypertension: Secondary | ICD-10-CM | POA: Diagnosis not present

## 2017-12-10 DIAGNOSIS — W19XXXD Unspecified fall, subsequent encounter: Secondary | ICD-10-CM | POA: Diagnosis not present

## 2017-12-10 DIAGNOSIS — G8929 Other chronic pain: Secondary | ICD-10-CM | POA: Diagnosis not present

## 2017-12-10 DIAGNOSIS — Z7901 Long term (current) use of anticoagulants: Secondary | ICD-10-CM | POA: Diagnosis not present

## 2017-12-10 DIAGNOSIS — S72142D Displaced intertrochanteric fracture of left femur, subsequent encounter for closed fracture with routine healing: Secondary | ICD-10-CM | POA: Diagnosis not present

## 2017-12-10 DIAGNOSIS — M545 Low back pain: Secondary | ICD-10-CM | POA: Diagnosis not present

## 2017-12-12 DIAGNOSIS — W19XXXD Unspecified fall, subsequent encounter: Secondary | ICD-10-CM | POA: Diagnosis not present

## 2017-12-12 DIAGNOSIS — G8929 Other chronic pain: Secondary | ICD-10-CM | POA: Diagnosis not present

## 2017-12-12 DIAGNOSIS — M545 Low back pain: Secondary | ICD-10-CM | POA: Diagnosis not present

## 2017-12-12 DIAGNOSIS — H353231 Exudative age-related macular degeneration, bilateral, with active choroidal neovascularization: Secondary | ICD-10-CM | POA: Diagnosis not present

## 2017-12-12 DIAGNOSIS — I10 Essential (primary) hypertension: Secondary | ICD-10-CM | POA: Diagnosis not present

## 2017-12-12 DIAGNOSIS — Z7901 Long term (current) use of anticoagulants: Secondary | ICD-10-CM | POA: Diagnosis not present

## 2017-12-12 DIAGNOSIS — S72142D Displaced intertrochanteric fracture of left femur, subsequent encounter for closed fracture with routine healing: Secondary | ICD-10-CM | POA: Diagnosis not present

## 2017-12-12 DIAGNOSIS — H35421 Microcystoid degeneration of retina, right eye: Secondary | ICD-10-CM | POA: Diagnosis not present

## 2017-12-12 DIAGNOSIS — H43813 Vitreous degeneration, bilateral: Secondary | ICD-10-CM | POA: Diagnosis not present

## 2017-12-29 DIAGNOSIS — L02212 Cutaneous abscess of back [any part, except buttock]: Secondary | ICD-10-CM | POA: Diagnosis not present

## 2018-01-08 DIAGNOSIS — I10 Essential (primary) hypertension: Secondary | ICD-10-CM | POA: Diagnosis not present

## 2018-01-08 DIAGNOSIS — D692 Other nonthrombocytopenic purpura: Secondary | ICD-10-CM | POA: Diagnosis not present

## 2018-01-08 DIAGNOSIS — E0500 Thyrotoxicosis with diffuse goiter without thyrotoxic crisis or storm: Secondary | ICD-10-CM | POA: Diagnosis not present

## 2018-01-08 DIAGNOSIS — M810 Age-related osteoporosis without current pathological fracture: Secondary | ICD-10-CM | POA: Diagnosis not present

## 2018-01-08 DIAGNOSIS — M150 Primary generalized (osteo)arthritis: Secondary | ICD-10-CM | POA: Diagnosis not present

## 2018-01-08 DIAGNOSIS — R911 Solitary pulmonary nodule: Secondary | ICD-10-CM | POA: Diagnosis not present

## 2018-01-08 DIAGNOSIS — Z0000 Encounter for general adult medical examination without abnormal findings: Secondary | ICD-10-CM | POA: Diagnosis not present

## 2018-01-08 DIAGNOSIS — E782 Mixed hyperlipidemia: Secondary | ICD-10-CM | POA: Diagnosis not present

## 2018-01-08 DIAGNOSIS — I8393 Asymptomatic varicose veins of bilateral lower extremities: Secondary | ICD-10-CM | POA: Diagnosis not present

## 2018-01-09 DIAGNOSIS — H43813 Vitreous degeneration, bilateral: Secondary | ICD-10-CM | POA: Diagnosis not present

## 2018-01-09 DIAGNOSIS — M1712 Unilateral primary osteoarthritis, left knee: Secondary | ICD-10-CM | POA: Diagnosis not present

## 2018-01-09 DIAGNOSIS — Z4789 Encounter for other orthopedic aftercare: Secondary | ICD-10-CM | POA: Diagnosis not present

## 2018-01-09 DIAGNOSIS — S72142D Displaced intertrochanteric fracture of left femur, subsequent encounter for closed fracture with routine healing: Secondary | ICD-10-CM | POA: Diagnosis not present

## 2018-01-09 DIAGNOSIS — H353231 Exudative age-related macular degeneration, bilateral, with active choroidal neovascularization: Secondary | ICD-10-CM | POA: Diagnosis not present

## 2018-01-09 DIAGNOSIS — M25562 Pain in left knee: Secondary | ICD-10-CM

## 2018-01-09 DIAGNOSIS — H35421 Microcystoid degeneration of retina, right eye: Secondary | ICD-10-CM | POA: Diagnosis not present

## 2018-01-09 HISTORY — DX: Pain in left knee: M25.562

## 2018-02-06 DIAGNOSIS — H35372 Puckering of macula, left eye: Secondary | ICD-10-CM | POA: Diagnosis not present

## 2018-02-06 DIAGNOSIS — H35421 Microcystoid degeneration of retina, right eye: Secondary | ICD-10-CM | POA: Diagnosis not present

## 2018-02-06 DIAGNOSIS — H353231 Exudative age-related macular degeneration, bilateral, with active choroidal neovascularization: Secondary | ICD-10-CM | POA: Diagnosis not present

## 2018-02-06 DIAGNOSIS — H43813 Vitreous degeneration, bilateral: Secondary | ICD-10-CM | POA: Diagnosis not present

## 2018-03-03 ENCOUNTER — Other Ambulatory Visit: Payer: Self-pay | Admitting: Family Medicine

## 2018-03-03 DIAGNOSIS — Z1231 Encounter for screening mammogram for malignant neoplasm of breast: Secondary | ICD-10-CM

## 2018-03-06 DIAGNOSIS — H35421 Microcystoid degeneration of retina, right eye: Secondary | ICD-10-CM | POA: Diagnosis not present

## 2018-03-06 DIAGNOSIS — H353211 Exudative age-related macular degeneration, right eye, with active choroidal neovascularization: Secondary | ICD-10-CM | POA: Diagnosis not present

## 2018-03-06 DIAGNOSIS — H35372 Puckering of macula, left eye: Secondary | ICD-10-CM | POA: Diagnosis not present

## 2018-03-06 DIAGNOSIS — H353223 Exudative age-related macular degeneration, left eye, with inactive scar: Secondary | ICD-10-CM | POA: Diagnosis not present

## 2018-03-19 DIAGNOSIS — R3915 Urgency of urination: Secondary | ICD-10-CM | POA: Diagnosis not present

## 2018-03-19 DIAGNOSIS — Z23 Encounter for immunization: Secondary | ICD-10-CM | POA: Diagnosis not present

## 2018-04-08 DIAGNOSIS — M25552 Pain in left hip: Secondary | ICD-10-CM | POA: Diagnosis not present

## 2018-04-09 ENCOUNTER — Ambulatory Visit
Admission: RE | Admit: 2018-04-09 | Discharge: 2018-04-09 | Disposition: A | Discharge status: Home / Self Care | Payer: Medicare HMO | Source: Ambulatory Visit | Attending: Family Medicine | Admitting: Family Medicine

## 2018-04-09 DIAGNOSIS — Z1231 Encounter for screening mammogram for malignant neoplasm of breast: Secondary | ICD-10-CM | POA: Diagnosis not present

## 2018-04-15 DIAGNOSIS — H353223 Exudative age-related macular degeneration, left eye, with inactive scar: Secondary | ICD-10-CM | POA: Diagnosis not present

## 2018-04-15 DIAGNOSIS — H35372 Puckering of macula, left eye: Secondary | ICD-10-CM | POA: Diagnosis not present

## 2018-04-15 DIAGNOSIS — H353211 Exudative age-related macular degeneration, right eye, with active choroidal neovascularization: Secondary | ICD-10-CM | POA: Diagnosis not present

## 2018-04-15 DIAGNOSIS — H35421 Microcystoid degeneration of retina, right eye: Secondary | ICD-10-CM | POA: Diagnosis not present

## 2018-06-10 DIAGNOSIS — H35421 Microcystoid degeneration of retina, right eye: Secondary | ICD-10-CM | POA: Diagnosis not present

## 2018-06-10 DIAGNOSIS — H35372 Puckering of macula, left eye: Secondary | ICD-10-CM | POA: Diagnosis not present

## 2018-06-10 DIAGNOSIS — H353223 Exudative age-related macular degeneration, left eye, with inactive scar: Secondary | ICD-10-CM | POA: Diagnosis not present

## 2018-06-10 DIAGNOSIS — H353211 Exudative age-related macular degeneration, right eye, with active choroidal neovascularization: Secondary | ICD-10-CM | POA: Diagnosis not present

## 2018-07-09 DIAGNOSIS — D692 Other nonthrombocytopenic purpura: Secondary | ICD-10-CM | POA: Diagnosis not present

## 2018-07-09 DIAGNOSIS — H61001 Unspecified perichondritis of right external ear: Secondary | ICD-10-CM | POA: Diagnosis not present

## 2018-07-09 DIAGNOSIS — I10 Essential (primary) hypertension: Secondary | ICD-10-CM | POA: Diagnosis not present

## 2018-07-09 DIAGNOSIS — E0500 Thyrotoxicosis with diffuse goiter without thyrotoxic crisis or storm: Secondary | ICD-10-CM | POA: Diagnosis not present

## 2018-07-09 DIAGNOSIS — M810 Age-related osteoporosis without current pathological fracture: Secondary | ICD-10-CM | POA: Diagnosis not present

## 2018-07-24 DIAGNOSIS — H35372 Puckering of macula, left eye: Secondary | ICD-10-CM | POA: Diagnosis not present

## 2018-07-24 DIAGNOSIS — H353211 Exudative age-related macular degeneration, right eye, with active choroidal neovascularization: Secondary | ICD-10-CM | POA: Diagnosis not present

## 2018-07-24 DIAGNOSIS — H35421 Microcystoid degeneration of retina, right eye: Secondary | ICD-10-CM | POA: Diagnosis not present

## 2018-07-24 DIAGNOSIS — H353223 Exudative age-related macular degeneration, left eye, with inactive scar: Secondary | ICD-10-CM | POA: Diagnosis not present

## 2018-07-27 DIAGNOSIS — D485 Neoplasm of uncertain behavior of skin: Secondary | ICD-10-CM | POA: Diagnosis not present

## 2018-07-27 DIAGNOSIS — L820 Inflamed seborrheic keratosis: Secondary | ICD-10-CM | POA: Diagnosis not present

## 2018-07-28 ENCOUNTER — Emergency Department (HOSPITAL_COMMUNITY)
Admission: EM | Admit: 2018-07-28 | Discharge: 2018-07-28 | Disposition: A | Discharge status: Home / Self Care | Payer: Medicare HMO | Attending: Emergency Medicine | Admitting: Emergency Medicine

## 2018-07-28 ENCOUNTER — Encounter (HOSPITAL_COMMUNITY): Payer: Self-pay

## 2018-07-28 ENCOUNTER — Other Ambulatory Visit: Payer: Self-pay

## 2018-07-28 ENCOUNTER — Emergency Department (HOSPITAL_COMMUNITY): Payer: Medicare HMO

## 2018-07-28 DIAGNOSIS — Y929 Unspecified place or not applicable: Secondary | ICD-10-CM | POA: Insufficient documentation

## 2018-07-28 DIAGNOSIS — M545 Low back pain, unspecified: Secondary | ICD-10-CM

## 2018-07-28 DIAGNOSIS — S3992XA Unspecified injury of lower back, initial encounter: Secondary | ICD-10-CM | POA: Diagnosis not present

## 2018-07-28 DIAGNOSIS — Y939 Activity, unspecified: Secondary | ICD-10-CM | POA: Diagnosis not present

## 2018-07-28 DIAGNOSIS — I10 Essential (primary) hypertension: Secondary | ICD-10-CM | POA: Diagnosis not present

## 2018-07-28 DIAGNOSIS — Y998 Other external cause status: Secondary | ICD-10-CM | POA: Insufficient documentation

## 2018-07-28 DIAGNOSIS — W010XXA Fall on same level from slipping, tripping and stumbling without subsequent striking against object, initial encounter: Secondary | ICD-10-CM | POA: Diagnosis not present

## 2018-07-28 DIAGNOSIS — R102 Pelvic and perineal pain: Secondary | ICD-10-CM | POA: Diagnosis not present

## 2018-07-28 DIAGNOSIS — Z79899 Other long term (current) drug therapy: Secondary | ICD-10-CM | POA: Insufficient documentation

## 2018-07-28 DIAGNOSIS — W19XXXA Unspecified fall, initial encounter: Secondary | ICD-10-CM

## 2018-07-28 DIAGNOSIS — S3993XA Unspecified injury of pelvis, initial encounter: Secondary | ICD-10-CM | POA: Diagnosis not present

## 2018-07-28 MED ORDER — TRAMADOL HCL 50 MG PO TABS: 50.0000 mg | ORAL_TABLET | Freq: Four times a day (QID) | ORAL | 0 refills | Status: DC | PRN

## 2018-07-28 MED ORDER — TRAMADOL HCL 50 MG PO TABS
50.0000 mg | ORAL_TABLET | Freq: Once | ORAL | Status: AC
  Administered 2018-07-28: 50 mg via ORAL
  Filled 2018-07-28: qty 1

## 2018-07-28 NOTE — ED Provider Notes (Signed)
[facility] DEPT Provider Note   CSN: [id number] Arrival date & time: 07/28/18  1007     History   Chief Complaint Chief Complaint  Patient presents with  . Fall    HPI Laurie Galloway is a 84 y.o. female.  HPI Patient states she slipped and fell onto her buttocks this morning at 7:30 AM.  Denies hitting her head.  No loss of consciousness.  States she is been able to ambulate since the fall but has worsening low back and pelvic pain.  Pain is worse with movement.  No focal weakness or numbness. Past Medical History:  Diagnosis Date  . Arthritis    hands  . Carpal tunnel syndrome of left wrist 08/2016  . Dental crowns present   . Family history of adverse reaction to anesthesia    pt's daughter has hx. of post-op N/V  . Hypertension    states under control with meds., has been on med. x "long time"  . PONV (postoperative nausea and vomiting)   . White coat hypertension     Patient Active Problem List   Diagnosis Date Noted  . Closed left hip fracture, initial encounter ([place]) 10/13/2017  . Hip fracture ([place]) 10/13/2017  . Cochlear implant in place 10/13/2017  . Subclinical hyperthyroidism 06/18/2013  . Small bowel obstruction, partial ([place]) 04/01/2012  . Small bowel obstruction ([place]) 01/07/2012  . HTN (hypertension) 01/07/2012  . Tachycardia 01/07/2012  . Gastrointestinal bleeding 01/07/2012  . Chronic back pain 01/07/2012  . Epigastric pain 01/07/2012    Past Surgical History:  Procedure Laterality Date  . ANTERIOR AND POSTERIOR VAGINAL REPAIR  11/08/2002  . APPENDECTOMY    . BOWEL RESECTION  01/08/2012   Procedure: SMALL BOWEL RESECTION;  Surgeon: Imogene Burn. Georgette Dover, MD;  Location: [facility];  Service: General;  Laterality: N/A;  . CARPAL TUNNEL RELEASE Left 08/29/2016   Procedure: CARPAL TUNNEL RELEASE, left;  Surgeon: Leanora Cover, MD;  Location: [facility];  Service: Orthopedics;  Laterality: Left;  . CATARACT EXTRACTION  W/ INTRAOCULAR LENS IMPLANT Left 01/09/2011  . CATARACT EXTRACTION W/ INTRAOCULAR LENS IMPLANT Right 03/06/2011  . COLONOSCOPY WITH PROPOFOL N/A 08/24/2013   Procedure: COLONOSCOPY WITH PROPOFOL;  Surgeon: Garlan Fair, MD;  Location: [facility];  Service: Endoscopy;  Laterality: N/A;  . ESOPHAGOGASTRODUODENOSCOPY (EGD) WITH PROPOFOL N/A 08/24/2013   Procedure: ESOPHAGOGASTRODUODENOSCOPY (EGD) WITH PROPOFOL;  Surgeon: Garlan Fair, MD;  Location: [facility];  Service: Endoscopy;  Laterality: N/A;  . EXTERNAL EAR SURGERY Bilateral    stapedectomy   . INTRAMEDULLARY (IM) NAIL INTERTROCHANTERIC Left 10/14/2017   Procedure: INTRAMEDULLARY (IM) NAIL INTERTROCHANTRIC;  Surgeon: Paralee Cancel, MD;  Location: [facility];  Service: Orthopedics;  Laterality: Left;  . LAPAROSCOPIC ASSISTED VAGINAL HYSTERECTOMY  11/08/2002  . LAPAROSCOPIC BILATERAL SALPINGO OOPHERECTOMY Bilateral 11/08/2002  . LAPAROSCOPIC LYSIS OF ADHESIONS  11/08/2002  . LAPAROTOMY  01/08/2012   Procedure: EXPLORATORY LAPAROTOMY;  Surgeon: Imogene Burn. Georgette Dover, MD;  Location: [facility];  Service: General;  Laterality: N/A;  . LUMBAR FUSION  03/19/2010   L3-4, L4-5  . LUMBAR LAMINECTOMY/DECOMPRESSION MICRODISCECTOMY  03/19/2010   L3-4, L4-5  . SHOULDER ARTHROSCOPY W/ ROTATOR CUFF REPAIR    . TONSILLECTOMY     age 61     OB History   None      Home Medications    Prior to Admission medications   Medication Sig Start Date End Date Taking? Authorizing Provider  acetaminophen (TYLENOL) 500 MG tablet Take  500-1,000 mg by mouth every 6 (six) hours as needed for mild pain.   Yes [provider]  betamethasone valerate (VALISONE) 0.1 % cream Apply 1 application topically 2 (two) times daily as needed (itching).  07/01/13  Yes [provider]  Calcium Carb-Cholecalciferol (CALCIUM+D3) 600-800 MG-UNIT TABS Take 1 tablet by mouth 2 (two) times daily with a meal.   Yes [provider]  Cholecalciferol (VITAMIN D)  2000 units tablet Take 2,000 Units by mouth daily.   Yes [provider]  clobetasol cream (TEMOVATE) 1.61 % Apply 1 application topically 2 (two) times daily. 07/27/18  Yes [provider]  denosumab (PROLIA) 60 MG/ML SOSY injection Inject 60 mg into the skin every 6 (six) months.   Yes [provider]  Ergocalciferol (VITAMIN D2) 2000 UNITS TABS Take 2,000 Units by mouth daily.    Yes [provider]  losartan (COZAAR) 100 MG tablet Take 100 mg by mouth every morning.    Yes [provider]  meloxicam (MOBIC) 15 MG tablet Take 15 mg by mouth daily. 06/22/18  Yes [provider]  Misc Natural Products (GLUCOSAMINE CHONDROITIN TRIPLE PO) Take 1 capsule by mouth 2 (two) times daily.    Yes [provider]  Multiple Vitamin (MULTIVITAMIN) tablet Take 1 tablet by mouth daily with supper.    Yes [provider]  Multiple Vitamins-Minerals (PRESERVISION AREDS 2 PO) Take 1 tablet by mouth 2 (two) times daily.   Yes [provider]  Omega-3 Fatty Acids (FISH OIL PO) Take 1 capsule by mouth daily.    Yes [provider]  Polyethyl Glycol-Propyl Glycol (SYSTANE) 0.4-0.3 % SOLN Place 1 drop into both eyes 4 (four) times daily as needed (dry eyes).    Yes [provider]  polyethylene glycol (MIRALAX / GLYCOLAX) packet Take 17 g daily by mouth. Patient taking differently: Take 17 g by mouth daily as needed for mild constipation.  10/18/17  Yes Florencia Reasons, MD  verapamil (VERELAN PM) 360 MG 24 hr capsule Take 360 mg by mouth every morning.    Yes [provider]  traMADol (ULTRAM) 50 MG tablet Take 1 tablet (50 mg total) by mouth every 6 (six) hours as needed for moderate pain or severe pain. 07/28/18   Julianne Rice, MD    Family History Family History  Problem Relation Age of Onset  . Anesthesia problems Daughter        post-op N/V    Social History Social History   Tobacco Use  . Smoking  status: Never Smoker  . Smokeless tobacco: Never Used  Substance Use Topics  . Alcohol use: No  . Drug use: No     Allergies   Penicillins; Sulfa antibiotics; and Sulfacetamide sodium   Review of Systems Review of Systems  Constitutional: Negative for chills and fever.  HENT: Negative for facial swelling.   Eyes: Negative for visual disturbance.  Respiratory: Negative for shortness of breath.   Cardiovascular: Negative for chest pain.  Gastrointestinal: Negative for abdominal pain, nausea and vomiting.  Genitourinary: Negative for dysuria, flank pain and frequency.  Musculoskeletal: Positive for arthralgias and back pain. Negative for myalgias and neck pain.  Skin: Negative for rash and wound.  Neurological: Negative for dizziness, weakness, light-headedness, numbness and headaches.  All other systems reviewed and are negative.    Physical Exam Updated Vital Signs BP (!) 189/81   Pulse 73   Temp 98.1 F (36.7 C)   Resp 16   Ht  5\' 3"  (1.6 m)   Wt 64.4 kg   SpO2 95%   BMI 25.15 kg/m   Physical Exam  Constitutional: She is oriented to person, place, and time. She appears well-developed and well-nourished. No distress.  HENT:  Head: Normocephalic and atraumatic.  Mouth/Throat: Oropharynx is clear and moist. No oropharyngeal exudate.  Eyes: Pupils are equal, round, and reactive to light. EOM are normal.  Neck: Normal range of motion. Neck supple.  Cardiovascular: Normal rate and regular rhythm.  Pulmonary/Chest: Effort normal and breath sounds normal. No stridor. No respiratory distress. She has no wheezes. She has no rales. She exhibits no tenderness.  Abdominal: Soft. Bowel sounds are normal. There is no tenderness. There is no rebound and no guarding.  Musculoskeletal: Normal range of motion. She exhibits tenderness. She exhibits no edema.  Patient with inferior midline lumbar tenderness to palpation.  Patient also has some mild tenderness to palpation over the left  iliac wing.  Is full range of motion of bilateral hips without pain.  Distal pulses intact.  Neurological: She is alert and oriented to person, place, and time.  5/5 motor in all extremities.  Sensation fully intact.  Skin: Skin is warm and dry. Capillary refill takes less than 2 seconds. No rash noted. She is not diaphoretic. No erythema.  Psychiatric: She has a normal mood and affect. Her behavior is normal.  Nursing note and vitals reviewed.    ED Treatments / Results  Labs (all labs ordered are listed, but only abnormal results are displayed) Labs Reviewed - No data to display  EKG None  Radiology Dg Lumbar Spine Complete  Result Date: 07/28/2018 CLINICAL DATA:  Injury.  Back pain.  Pelvic pain. EXAM: LUMBAR SPINE - COMPLETE 4+ VIEW COMPARISON:  Lumbar spine series 08/23/2014. FINDINGS: Lumbar spine numbered with the lowest segmented appearing lumbar shaped vertebrae as L5. Lumbar spine scoliosis. Diffuse degenerative change. L3 through L5 posterior and interbody fusion. Multiple stable lumbar compression fractures. Postsurgical changes left hip. Degenerative changes right hip. Osteitis pubis. No acute abnormality. Aortoiliac atherosclerotic vascular calcification. IMPRESSION: Lumbar spine scoliosis and degenerative change. Prior L3 through L5 posterior and interbody fusion. Hardware intact. Stable alignment. Electronically Signed   By: Marcello Moores  Register   On: 07/28/2018 12:15   Dg Pelvis 1-2 Views  Result Date: 07/28/2018 CLINICAL DATA:  84 year old female with pelvic pain after slip and fall on wet floor this morning. EXAM: PELVIS - 1-2 VIEW COMPARISON:  Left hip intraoperative images 10/14/17. Left hip series 10/13/2017. CT Abdomen and Pelvis 07/02/2013. FINDINGS: Sequelae of left femur distal intertrochanteric fracture re-demonstrated with stable visible intramedullary rod and interlocking hip screw. Evidence of healing along the fracture line at the lesser trochanter, although solid  osseous union may not be complete. The femoral heads remain normally located. Stable hip joint spaces. Grossly intact proximal right femur. No acute fracture of the pelvis identified. Partially visible chronic lower lumbar spine surgery. Staple line in the left lower quadrant. Negative visible bowel gas pattern. Calcified femoral artery atherosclerosis. IMPRESSION: 1.  No acute fracture or dislocation identified about the pelvis. 2. 2018 ORIF proximal left femur with interval healing but perhaps incomplete solid osseous union at the lesser trochanter fracture site. Electronically Signed   By: Genevie Ann M.D.   On: 07/28/2018 12:16    Procedures Procedures (including critical care time)  Medications Ordered in ED Medications  traMADol (ULTRAM) tablet 50 mg (has no administration in time range)     Initial Impression / Assessment  and Plan / ED Course  I have reviewed the triage vital signs and the nursing notes.  Pertinent labs & imaging results that were available during my care of the patient were reviewed by me and considered in my medical decision making (see chart for details).     No acute findings on x-ray.  Question incomplete healing of the lesser trochanteric fracture site.  Encouraged to follow-up with her orthopedist.  Return precautions given.  Final Clinical Impressions(s) / ED Diagnoses   Final diagnoses:  Fall, initial encounter  Acute left-sided low back pain without sciatica    ED Discharge Orders         Ordered    traMADol (ULTRAM) 50 MG tablet  Every 6 hours PRN     07/28/18 1246           Julianne Rice, MD 07/28/18 1246

## 2018-07-28 NOTE — ED Triage Notes (Signed)
Patient states she was going to use the bathroom and leaked on the floor. Patient's cane slipped and she fell straight down on her buttocks. Patient c/o lower back pain and hips. Patient has had previous history of pain and surgeries of this area.

## 2018-07-28 NOTE — ED Notes (Signed)
Patient given discharge teaching and verbalized understanding. Patient ambulated out of ED with the assistance of a walker.

## 2018-07-28 NOTE — ED Notes (Signed)
Patient transported to X-ray 

## 2018-07-30 DIAGNOSIS — N390 Urinary tract infection, site not specified: Secondary | ICD-10-CM | POA: Diagnosis not present

## 2018-07-30 DIAGNOSIS — Z23 Encounter for immunization: Secondary | ICD-10-CM | POA: Diagnosis not present

## 2018-07-30 DIAGNOSIS — M545 Low back pain: Secondary | ICD-10-CM | POA: Diagnosis not present

## 2018-07-30 DIAGNOSIS — R350 Frequency of micturition: Secondary | ICD-10-CM | POA: Diagnosis not present

## 2018-08-07 DIAGNOSIS — M5136 Other intervertebral disc degeneration, lumbar region: Secondary | ICD-10-CM | POA: Diagnosis not present

## 2018-08-07 DIAGNOSIS — M545 Low back pain: Secondary | ICD-10-CM | POA: Diagnosis not present

## 2018-08-07 DIAGNOSIS — M4856XS Collapsed vertebra, not elsewhere classified, lumbar region, sequela of fracture: Secondary | ICD-10-CM | POA: Diagnosis not present

## 2018-08-07 DIAGNOSIS — M25552 Pain in left hip: Secondary | ICD-10-CM | POA: Diagnosis not present

## 2018-08-14 DIAGNOSIS — R350 Frequency of micturition: Secondary | ICD-10-CM | POA: Diagnosis not present

## 2018-09-03 DIAGNOSIS — M25552 Pain in left hip: Secondary | ICD-10-CM | POA: Diagnosis not present

## 2018-09-03 DIAGNOSIS — S300XXD Contusion of lower back and pelvis, subsequent encounter: Secondary | ICD-10-CM | POA: Diagnosis not present

## 2018-09-09 DIAGNOSIS — H35421 Microcystoid degeneration of retina, right eye: Secondary | ICD-10-CM | POA: Diagnosis not present

## 2018-09-09 DIAGNOSIS — H353211 Exudative age-related macular degeneration, right eye, with active choroidal neovascularization: Secondary | ICD-10-CM | POA: Diagnosis not present

## 2018-09-09 DIAGNOSIS — H353223 Exudative age-related macular degeneration, left eye, with inactive scar: Secondary | ICD-10-CM | POA: Diagnosis not present

## 2018-09-09 DIAGNOSIS — H35372 Puckering of macula, left eye: Secondary | ICD-10-CM | POA: Diagnosis not present

## 2018-09-30 DIAGNOSIS — M545 Low back pain: Secondary | ICD-10-CM | POA: Diagnosis not present

## 2018-09-30 DIAGNOSIS — M5136 Other intervertebral disc degeneration, lumbar region: Secondary | ICD-10-CM | POA: Diagnosis not present

## 2018-09-30 DIAGNOSIS — S39012A Strain of muscle, fascia and tendon of lower back, initial encounter: Secondary | ICD-10-CM | POA: Diagnosis not present

## 2018-09-30 DIAGNOSIS — S72142D Displaced intertrochanteric fracture of left femur, subsequent encounter for closed fracture with routine healing: Secondary | ICD-10-CM | POA: Diagnosis not present

## 2018-10-06 DIAGNOSIS — M5136 Other intervertebral disc degeneration, lumbar region: Secondary | ICD-10-CM

## 2018-10-06 DIAGNOSIS — M961 Postlaminectomy syndrome, not elsewhere classified: Secondary | ICD-10-CM | POA: Diagnosis not present

## 2018-10-06 DIAGNOSIS — M51369 Other intervertebral disc degeneration, lumbar region without mention of lumbar back pain or lower extremity pain: Secondary | ICD-10-CM

## 2018-10-06 HISTORY — DX: Postlaminectomy syndrome, not elsewhere classified: M96.1

## 2018-10-06 HISTORY — DX: Other intervertebral disc degeneration, lumbar region without mention of lumbar back pain or lower extremity pain: M51.369

## 2018-10-06 HISTORY — DX: Other intervertebral disc degeneration, lumbar region: M51.36

## 2018-10-11 ENCOUNTER — Emergency Department (HOSPITAL_COMMUNITY): Payer: Medicare HMO

## 2018-10-11 ENCOUNTER — Encounter (HOSPITAL_COMMUNITY): Payer: Self-pay

## 2018-10-11 ENCOUNTER — Other Ambulatory Visit: Payer: Self-pay

## 2018-10-11 ENCOUNTER — Emergency Department (HOSPITAL_COMMUNITY)
Admission: EM | Admit: 2018-10-11 | Discharge: 2018-10-11 | Disposition: A | Discharge status: Home / Self Care | Payer: Medicare HMO | Source: Home / Self Care | Attending: Emergency Medicine | Admitting: Emergency Medicine

## 2018-10-11 DIAGNOSIS — S129XXA Fracture of neck, unspecified, initial encounter: Secondary | ICD-10-CM | POA: Diagnosis not present

## 2018-10-11 DIAGNOSIS — S32018A Other fracture of first lumbar vertebra, initial encounter for closed fracture: Secondary | ICD-10-CM | POA: Insufficient documentation

## 2018-10-11 DIAGNOSIS — Y999 Unspecified external cause status: Secondary | ICD-10-CM

## 2018-10-11 DIAGNOSIS — S22080A Wedge compression fracture of T11-T12 vertebra, initial encounter for closed fracture: Secondary | ICD-10-CM

## 2018-10-11 DIAGNOSIS — Y939 Activity, unspecified: Secondary | ICD-10-CM | POA: Insufficient documentation

## 2018-10-11 DIAGNOSIS — W1830XA Fall on same level, unspecified, initial encounter: Secondary | ICD-10-CM | POA: Insufficient documentation

## 2018-10-11 DIAGNOSIS — I10 Essential (primary) hypertension: Secondary | ICD-10-CM | POA: Insufficient documentation

## 2018-10-11 DIAGNOSIS — M549 Dorsalgia, unspecified: Secondary | ICD-10-CM | POA: Diagnosis not present

## 2018-10-11 DIAGNOSIS — Z79899 Other long term (current) drug therapy: Secondary | ICD-10-CM | POA: Insufficient documentation

## 2018-10-11 DIAGNOSIS — Y929 Unspecified place or not applicable: Secondary | ICD-10-CM | POA: Insufficient documentation

## 2018-10-11 DIAGNOSIS — M8008XA Age-related osteoporosis with current pathological fracture, vertebra(e), initial encounter for fracture: Secondary | ICD-10-CM | POA: Diagnosis not present

## 2018-10-11 DIAGNOSIS — S22089A Unspecified fracture of T11-T12 vertebra, initial encounter for closed fracture: Secondary | ICD-10-CM | POA: Diagnosis not present

## 2018-10-11 LAB — PROTIME-INR
INR: 0.99
Prothrombin Time: 13 seconds (ref 11.4–15.2)

## 2018-10-11 LAB — BASIC METABOLIC PANEL
Anion gap: 7 (ref 5–15)
BUN: 28 mg/dL — ABNORMAL HIGH (ref 8–23)
CO2: 27 mmol/L (ref 22–32)
Calcium: 8.7 mg/dL — ABNORMAL LOW (ref 8.9–10.3)
Chloride: 96 mmol/L — ABNORMAL LOW (ref 98–111)
Creatinine, Ser: 0.52 mg/dL (ref 0.44–1.00)
GFR calc Af Amer: >60 mL/min (ref >60)
GFR calc non Af Amer: >60 mL/min (ref >60)
Glucose, Bld: 106 mg/dL — ABNORMAL HIGH (ref 70–99)
Potassium: 4.4 mmol/L (ref 3.5–5.1)
Sodium: 130 mmol/L — ABNORMAL LOW (ref 135–145)

## 2018-10-11 LAB — CBC
HCT: 41.9 % (ref 36.0–46.0)
Hemoglobin: 13.5 g/dL (ref 12.0–15.0)
MCH: 32.1 pg (ref 26.0–34.0)
MCHC: 32.2 g/dL (ref 30.0–36.0)
MCV: 99.5 fL (ref 80.0–100.0)
Platelets: 330 10*3/uL (ref 150–400)
RBC: 4.21 MIL/uL (ref 3.87–5.11)
RDW: 15.5 % (ref 11.5–15.5)
WBC: 11.8 10*3/uL — ABNORMAL HIGH (ref 4.0–10.5)
nRBC: 0 % (ref 0.0–0.2)

## 2018-10-11 MED ORDER — HYDROMORPHONE HCL 1 MG/ML IJ SOLN
0.5000 mg | INTRAMUSCULAR | Status: AC | PRN
  Administered 2018-10-11 (×2): 0.5 mg via INTRAVENOUS
  Filled 2018-10-11 (×2): qty 1

## 2018-10-11 MED ORDER — SODIUM CHLORIDE 0.9 % IV BOLUS: 500.0000 mL | Freq: Once | INTRAVENOUS | Status: DC

## 2018-10-11 MED ORDER — HYDROCODONE-ACETAMINOPHEN 5-325 MG PO TABS: 2.0000 | ORAL_TABLET | Freq: Four times a day (QID) | ORAL | 0 refills | Status: DC | PRN

## 2018-10-11 NOTE — ED Triage Notes (Signed)
Pt states that approximately  2 months ago, she fell. Pt states states lower back pain has gotten worse. Pt states pain has gotten worse approximately 3 weeks ago. Pt saw Dr Lawanna Kobus , who gave steroids without relief. Pt then tried hydrocodone without relief. Pt also states that her legs and ankles are more edematous than before.

## 2018-10-11 NOTE — Discharge Instructions (Signed)
Take an over the counter stool softener while taking the hydrocodone, do not exceed 4 gm of tylenol per day.  Each hydrocodone tablet has 325 mg of tylenol in it.  Follow up with Dr Annette Stable.  Wear the brace for comfort.

## 2018-10-11 NOTE — ED Provider Notes (Signed)
[facility] DEPT Provider Note   CSN: [id number] Arrival date & time: 10/11/18  1007     History   Chief Complaint Chief Complaint  Patient presents with  . Back Pain    HPI PERINA SALVAGGIO is a 84 y.o. female.  HPI Patient presents to the emergency room for worsening lower back pain.  Patient states she initially had a fall a couple months ago.  She was seen in the emergency room and had x-rays that did not show any evidence of acute fracture.  In the last few weeks however the patient's had significant worsening of her lower back pain.  The pain is in her lower back and radiates down towards her thighs.  Patient has been seeing her orthopedic doctor.  She was initially trying NSAIDs but her pain has progressed.  She was given a prescription for hydrocodone by Dr. Nelva Bush at the end of last week.  Patient symptoms however are persisting and not responding to the hydrocodone.  Patient is having difficulty standing and walking now.  Daughter is requesting a CT scan of the lumbar spine. Past Medical History:  Diagnosis Date  . Arthritis    hands  . Carpal tunnel syndrome of left wrist 08/2016  . Dental crowns present   . Family history of adverse reaction to anesthesia    pt's daughter has hx. of post-op N/V  . Hypertension    states under control with meds., has been on med. x "long time"  . PONV (postoperative nausea and vomiting)   . White coat hypertension     Patient Active Problem List   Diagnosis Date Noted  . Closed left hip fracture, initial encounter ([place]) 10/13/2017  . Hip fracture ([place]) 10/13/2017  . Cochlear implant in place 10/13/2017  . Subclinical hyperthyroidism 06/18/2013  . Small bowel obstruction, partial ([place]) 04/01/2012  . Small bowel obstruction ([place]) 01/07/2012  . HTN (hypertension) 01/07/2012  . Tachycardia 01/07/2012  . Gastrointestinal bleeding 01/07/2012  . Chronic back pain 01/07/2012  . Epigastric pain 01/07/2012     Past Surgical History:  Procedure Laterality Date  . ANTERIOR AND POSTERIOR VAGINAL REPAIR  11/08/2002  . APPENDECTOMY    . BOWEL RESECTION  01/08/2012   Procedure: SMALL BOWEL RESECTION;  Surgeon: Imogene Burn. Georgette Dover, MD;  Location: [facility];  Service: General;  Laterality: N/A;  . CARPAL TUNNEL RELEASE Left 08/29/2016   Procedure: CARPAL TUNNEL RELEASE, left;  Surgeon: Leanora Cover, MD;  Location: [facility];  Service: Orthopedics;  Laterality: Left;  . CATARACT EXTRACTION W/ INTRAOCULAR LENS IMPLANT Left 01/09/2011  . CATARACT EXTRACTION W/ INTRAOCULAR LENS IMPLANT Right 03/06/2011  . COLONOSCOPY WITH PROPOFOL N/A 08/24/2013   Procedure: COLONOSCOPY WITH PROPOFOL;  Surgeon: Garlan Fair, MD;  Location: [facility];  Service: Endoscopy;  Laterality: N/A;  . ESOPHAGOGASTRODUODENOSCOPY (EGD) WITH PROPOFOL N/A 08/24/2013   Procedure: ESOPHAGOGASTRODUODENOSCOPY (EGD) WITH PROPOFOL;  Surgeon: Garlan Fair, MD;  Location: [facility];  Service: Endoscopy;  Laterality: N/A;  . EXTERNAL EAR SURGERY Bilateral    stapedectomy   . INTRAMEDULLARY (IM) NAIL INTERTROCHANTERIC Left 10/14/2017   Procedure: INTRAMEDULLARY (IM) NAIL INTERTROCHANTRIC;  Surgeon: Paralee Cancel, MD;  Location: [facility];  Service: Orthopedics;  Laterality: Left;  . LAPAROSCOPIC ASSISTED VAGINAL HYSTERECTOMY  11/08/2002  . LAPAROSCOPIC BILATERAL SALPINGO OOPHERECTOMY Bilateral 11/08/2002  . LAPAROSCOPIC LYSIS OF ADHESIONS  11/08/2002  . LAPAROTOMY  01/08/2012   Procedure: EXPLORATORY LAPAROTOMY;  Surgeon: Imogene Burn. Georgette Dover, MD;  Location: [facility];  Service: General;  Laterality: N/A;  . LUMBAR FUSION  03/19/2010   L3-4, L4-5  . LUMBAR LAMINECTOMY/DECOMPRESSION MICRODISCECTOMY  03/19/2010   L3-4, L4-5  . SHOULDER ARTHROSCOPY W/ ROTATOR CUFF REPAIR    . TONSILLECTOMY     age 30     OB History   None      Home Medications    Prior to Admission medications   Medication Sig Start Date End Date Taking?  Authorizing Provider  acetaminophen (TYLENOL) 500 MG tablet Take 500-1,000 mg by mouth every 6 (six) hours as needed for mild pain.    [provider]  betamethasone valerate (VALISONE) 0.1 % cream Apply 1 application topically 2 (two) times daily as needed (itching).  07/01/13   [provider]  Calcium Carb-Cholecalciferol (CALCIUM+D3) 600-800 MG-UNIT TABS Take 1 tablet by mouth 2 (two) times daily with a meal.    [provider]  Cholecalciferol (VITAMIN D) 2000 units tablet Take 2,000 Units by mouth daily.    [provider]  clobetasol cream (TEMOVATE) 2.77 % Apply 1 application topically 2 (two) times daily. 07/27/18   [provider]  denosumab (PROLIA) 60 MG/ML SOSY injection Inject 60 mg into the skin every 6 (six) months.    [provider]  Ergocalciferol (VITAMIN D2) 2000 UNITS TABS Take 2,000 Units by mouth daily.     [provider]  HYDROcodone-acetaminophen (NORCO/VICODIN) 5-325 MG tablet Take 2 tablets by mouth every 6 (six) hours as needed. 10/11/18   Dorie Rank, MD  losartan (COZAAR) 100 MG tablet Take 100 mg by mouth every morning.     [provider]  meloxicam (MOBIC) 15 MG tablet Take 15 mg by mouth daily. 06/22/18   [provider]  Misc Natural Products (GLUCOSAMINE CHONDROITIN TRIPLE PO) Take 1 capsule by mouth 2 (two) times daily.     [provider]  Multiple Vitamin (MULTIVITAMIN) tablet Take 1 tablet by mouth daily with supper.     [provider]  Multiple Vitamins-Minerals (PRESERVISION AREDS 2 PO) Take 1 tablet by mouth 2 (two) times daily.    [provider]  Omega-3 Fatty Acids (FISH OIL PO) Take 1 capsule by mouth daily.     [provider]  Polyethyl Glycol-Propyl Glycol (SYSTANE) 0.4-0.3 % SOLN Place 1 drop into both eyes 4 (four) times daily as needed (dry eyes).     [provider]  polyethylene glycol (MIRALAX / GLYCOLAX) packet Take 17 g  daily by mouth. Patient taking differently: Take 17 g by mouth daily as needed for mild constipation.  10/18/17   Florencia Reasons, MD  traMADol (ULTRAM) 50 MG tablet Take 1 tablet (50 mg total) by mouth every 6 (six) hours as needed for moderate pain or severe pain. 07/28/18   Julianne Rice, MD  verapamil (VERELAN PM) 360 MG 24 hr capsule Take 360 mg by mouth every morning.     [provider]    Family History Family History  Problem Relation Age of Onset  . Anesthesia problems Daughter        post-op N/V    Social History Social History   Tobacco Use  . Smoking status: Never Smoker  . Smokeless tobacco: Never Used  Substance Use Topics  . Alcohol use: No  . Drug use: No     Allergies   Penicillins; Sulfa antibiotics; and Sulfacetamide sodium   Review of Systems Review of Systems  All other systems reviewed and are negative.  Physical Exam Updated Vital Signs BP (!) 157/69   Pulse 97   Temp 98 F (36.7 C) (Oral)   Resp 18   Ht 1.6 m (5\' 3" )   Wt 61.2 kg   SpO2 98%   BMI 23.91 kg/m   Physical Exam  Constitutional: She appears well-developed and well-nourished. No distress.  HENT:  Head: Normocephalic and atraumatic.  Right Ear: External ear normal.  Left Ear: External ear normal.  Eyes: Conjunctivae are normal. Right eye exhibits no discharge. Left eye exhibits no discharge. No scleral icterus.  Neck: Neck supple. No tracheal deviation present.  Cardiovascular: Normal rate, regular rhythm and intact distal pulses.  Pulmonary/Chest: Effort normal and breath sounds normal. No stridor. No respiratory distress. She has no wheezes. She has no rales.  Abdominal: Soft. Bowel sounds are normal. She exhibits no distension. There is no tenderness. There is no rebound and no guarding.  Musculoskeletal: She exhibits no edema or tenderness.  Pain with attempting to lift legs off the bed, edema of the left lower extremity with some ecchymoses noted at the dorsum  and base of her foot  Neurological: She is alert. She has normal strength. No cranial nerve deficit (no facial droop, extraocular movements intact, no slurred speech) or sensory deficit. She exhibits normal muscle tone. She displays no seizure activity. Coordination normal.  5 out of 5 plantar dorsiflexion, normal sensation of the lower extremities,    Skin: Skin is warm and dry. No rash noted.  Psychiatric: She has a normal mood and affect.  Nursing note and vitals reviewed.    ED Treatments / Results  Labs (all labs ordered are listed, but only abnormal results are displayed) Labs Reviewed  CBC - Abnormal; Notable for the following components:      Result Value   WBC 11.8 (*)    All other components within normal limits  BASIC METABOLIC PANEL - Abnormal; Notable for the following components:   Sodium 130 (*)    Chloride 96 (*)    Glucose, Bld 106 (*)    BUN 28 (*)    Calcium 8.7 (*)    All other components within normal limits  PROTIME-INR    EKG None  Radiology Ct Lumbar Spine Wo Contrast  Result Date: 10/11/2018 CLINICAL DATA:  Fall 6 weeks ago.  Back pain. EXAM: CT LUMBAR SPINE WITHOUT CONTRAST TECHNIQUE: Multidetector CT imaging of the lumbar spine was performed without intravenous contrast administration. Multiplanar CT image reconstructions were also generated. COMPARISON:  07/28/2018 radiographs FINDINGS: Segmentation: The lowest lumbar type non-rib-bearing vertebra is labeled as L5. Alignment: 9 mm anterolisthesis at L3-4, chronic. 9 mm anterolisthesis at L4-5, chronic. Vertebrae: Severe compression fracture at T12 with 90% loss of height including a horizontal component, an oblique component extending to both superior and inferior endplates, with fracture extending into the bases of both pedicles but no well-defined fracture involving the lamina or spinous process. There is 11 mm of posterior bony retropulsion at this level, narrowing the AP diameter of the thecal sac to  8 mm compatible with moderate central narrowing of the thecal sac the bony retropulsion is mildly eccentric to the left. Acute left transverse process fracture at L1. Compression fractures at L2, L3, and L4 are roughly similar to the 07/28/2018 exam. Posterolateral rod and pedicle screw fixation at L3-L4-L5. Paraspinal and other soft tissues: Aortoiliac atherosclerotic vascular disease. 3.1 by 1.8 cm right adrenal adenoma. Sigmoid colon diverticulosis. Disc levels: As noted above there is at least moderate central  narrowing of the thecal sac at the T12 level and potentially some mild right foraminal stenosis due to spurring at T11-12. L1-2: No impingement.  Disc bulge. L2-3: Prominent left and moderate right foraminal stenosis due to disc bulge and facet and intervertebral spurring. L3-4: Suspected mild left foraminal stenosis due to spurring and subluxation. L4-5: Unremarkable.  Posterior decompression. L5-S1: Mild bilateral foraminal stenosis due to intervertebral spurring and disc bulge. IMPRESSION: 1. The dominant finding is a severe compression fracture at T12 involving all 3 columns, with 90% loss of height and 11 mm of posterior bony retropulsion leading to at least moderate central narrowing of the thecal sac. There fractures through the pedicles as well as both transverse and oblique fractures through the vertebral body. This is new compared to 07/28/2018. 2. Acute left transverse process fracture at L1. 3. Old compression fractures at L2, L3, and L4 with posterolateral rod and pedicle screw fixation and posterior decompression. 4. Lumbar spondylosis and degenerative disc disease causing prominent impingement at L2-3, and mild impingement at L3-4 and L5-S1 as detailed above. This is in addition to the impingement from bony retropulsion at the T12 vertebral level. 5. Right adrenal adenoma. 6.  Aortic Atherosclerosis (ICD10-I70.0). 7. Sigmoid colon diverticulosis. Electronically Signed   By: Van Clines M.D.   On: 10/11/2018 11:54    Procedures Procedures (including critical care time)  Medications Ordered in ED Medications  HYDROmorphone (DILAUDID) injection 0.5 mg (0.5 mg Intravenous Given 10/11/18 1510)     Initial Impression / Assessment and Plan / ED Course  I have reviewed the triage vital signs and the nursing notes.  Pertinent labs & imaging results that were available during my care of the patient were reviewed by me and considered in my medical decision making (see chart for details).  Clinical Course as of Oct 11 1521  Sun Oct 11, 2018  1213 WBC slightly elevated. Hgb stable.  Na decreased compared to recent   [JK]  1327 CT scan findings were discussed with the patient and the family.  I spoke with Dr. Sherley Bounds neurosurgery.  No surgical intervention recommended.  A TLSO brace may certainly help her with her pain.   [JK]    Clinical Course User Index [JK] Dorie Rank, MD    Patient presented to the emergency room with persistent low back pain.  It has been increasing over the last few weeks.  CT scan was performed that shows a T12 compression fracture.  She also has an L1 transverse process fracture.  Patient does not have any weakness, numbness or other neurologic deficits.  She is unfortunate having some radicular pain.  Dr. Ronnald Ramp reviewed the CT imaging and fortunately patient does not require any acute surgical intervention.  TLSO brace was ordered for her pain.  Patient was given Dilaudid to help her with her pain.  She has been taking 1 hydrocodone every 8 hours.  I will have the patient increase to 2 tablets every 6 hours.  Follow-up with her neurosurgeon.  Final Clinical Impressions(s) / ED Diagnoses   Final diagnoses:  Compression fracture of T12 vertebra, initial encounter ([place])  Closed fracture of transverse process of cervical vertebra, initial encounter [facility])    ED Discharge Orders         Ordered    HYDROcodone-acetaminophen  (NORCO/VICODIN) 5-325 MG tablet  Every 6 hours PRN     10/11/18 1400           Dorie Rank, MD 10/11/18 1523

## 2018-10-11 NOTE — ED Notes (Signed)
Patient transported to CT 

## 2018-10-12 ENCOUNTER — Inpatient Hospital Stay (HOSPITAL_COMMUNITY)
Admission: EM | Admit: 2018-10-12 | Discharge: 2018-10-22 | DRG: 457 | Disposition: A | Discharge status: Skilled Nursing Facility | Payer: Medicare HMO | Attending: Family Medicine | Admitting: Family Medicine

## 2018-10-12 ENCOUNTER — Encounter (HOSPITAL_COMMUNITY): Payer: Self-pay | Admitting: Emergency Medicine

## 2018-10-12 ENCOUNTER — Other Ambulatory Visit: Payer: Self-pay

## 2018-10-12 DIAGNOSIS — D62 Acute posthemorrhagic anemia: Secondary | ICD-10-CM

## 2018-10-12 DIAGNOSIS — K5900 Constipation, unspecified: Secondary | ICD-10-CM | POA: Diagnosis present

## 2018-10-12 DIAGNOSIS — G9589 Other specified diseases of spinal cord: Secondary | ICD-10-CM | POA: Diagnosis present

## 2018-10-12 DIAGNOSIS — E222 Syndrome of inappropriate secretion of antidiuretic hormone: Secondary | ICD-10-CM | POA: Diagnosis not present

## 2018-10-12 DIAGNOSIS — G8918 Other acute postprocedural pain: Secondary | ICD-10-CM | POA: Diagnosis not present

## 2018-10-12 DIAGNOSIS — I700 Atherosclerosis of aorta: Secondary | ICD-10-CM | POA: Diagnosis not present

## 2018-10-12 DIAGNOSIS — W1830XA Fall on same level, unspecified, initial encounter: Secondary | ICD-10-CM | POA: Diagnosis not present

## 2018-10-12 DIAGNOSIS — I10 Essential (primary) hypertension: Secondary | ICD-10-CM | POA: Diagnosis not present

## 2018-10-12 DIAGNOSIS — Z90710 Acquired absence of both cervix and uterus: Secondary | ICD-10-CM | POA: Diagnosis not present

## 2018-10-12 DIAGNOSIS — M8008XA Age-related osteoporosis with current pathological fracture, vertebra(e), initial encounter for fracture: Secondary | ICD-10-CM | POA: Diagnosis not present

## 2018-10-12 DIAGNOSIS — M19041 Primary osteoarthritis, right hand: Secondary | ICD-10-CM | POA: Diagnosis present

## 2018-10-12 DIAGNOSIS — Z880 Allergy status to penicillin: Secondary | ICD-10-CM | POA: Diagnosis not present

## 2018-10-12 DIAGNOSIS — Z9841 Cataract extraction status, right eye: Secondary | ICD-10-CM | POA: Diagnosis not present

## 2018-10-12 DIAGNOSIS — Z981 Arthrodesis status: Secondary | ICD-10-CM | POA: Diagnosis not present

## 2018-10-12 DIAGNOSIS — D72829 Elevated white blood cell count, unspecified: Secondary | ICD-10-CM

## 2018-10-12 DIAGNOSIS — Z882 Allergy status to sulfonamides status: Secondary | ICD-10-CM

## 2018-10-12 DIAGNOSIS — Z9842 Cataract extraction status, left eye: Secondary | ICD-10-CM | POA: Diagnosis not present

## 2018-10-12 DIAGNOSIS — E871 Hypo-osmolality and hyponatremia: Secondary | ICD-10-CM

## 2018-10-12 DIAGNOSIS — Z961 Presence of intraocular lens: Secondary | ICD-10-CM | POA: Diagnosis present

## 2018-10-12 DIAGNOSIS — M19042 Primary osteoarthritis, left hand: Secondary | ICD-10-CM | POA: Diagnosis present

## 2018-10-12 DIAGNOSIS — Z66 Do not resuscitate: Secondary | ICD-10-CM | POA: Diagnosis present

## 2018-10-12 DIAGNOSIS — Z791 Long term (current) use of non-steroidal anti-inflammatories (NSAID): Secondary | ICD-10-CM

## 2018-10-12 DIAGNOSIS — Y92009 Unspecified place in unspecified non-institutional (private) residence as the place of occurrence of the external cause: Secondary | ICD-10-CM

## 2018-10-12 DIAGNOSIS — S22080A Wedge compression fracture of T11-T12 vertebra, initial encounter for closed fracture: Secondary | ICD-10-CM

## 2018-10-12 DIAGNOSIS — M8448XA Pathological fracture, other site, initial encounter for fracture: Secondary | ICD-10-CM | POA: Diagnosis present

## 2018-10-12 DIAGNOSIS — Z419 Encounter for procedure for purposes other than remedying health state, unspecified: Secondary | ICD-10-CM

## 2018-10-12 DIAGNOSIS — M549 Dorsalgia, unspecified: Secondary | ICD-10-CM | POA: Diagnosis present

## 2018-10-12 DIAGNOSIS — S22000A Wedge compression fracture of unspecified thoracic vertebra, initial encounter for closed fracture: Secondary | ICD-10-CM | POA: Diagnosis not present

## 2018-10-12 DIAGNOSIS — W19XXXA Unspecified fall, initial encounter: Secondary | ICD-10-CM

## 2018-10-12 HISTORY — DX: Atherosclerosis of aorta: I70.0

## 2018-10-12 MED ORDER — ACETAMINOPHEN 325 MG PO TABS: 650.0000 mg | ORAL_TABLET | Freq: Four times a day (QID) | ORAL | Status: DC | PRN

## 2018-10-12 MED ORDER — OXYCODONE HCL 5 MG PO TABS
5.0000 mg | ORAL_TABLET | ORAL | Status: DC | PRN
  Administered 2018-10-13 – 2018-10-19 (×15): 5 mg via ORAL
  Filled 2018-10-12 (×15): qty 1

## 2018-10-12 MED ORDER — VERAPAMIL HCL ER 180 MG PO TBCR
360.0000 mg | EXTENDED_RELEASE_TABLET | Freq: Every morning | ORAL | Status: DC
  Administered 2018-10-13 – 2018-10-22 (×10): 360 mg via ORAL
  Filled 2018-10-12 (×10): qty 2

## 2018-10-12 MED ORDER — LOSARTAN POTASSIUM 50 MG PO TABS
100.0000 mg | ORAL_TABLET | Freq: Every morning | ORAL | Status: DC
  Administered 2018-10-13 – 2018-10-17 (×5): 100 mg via ORAL
  Filled 2018-10-12 (×5): qty 2

## 2018-10-12 MED ORDER — POLYETHYLENE GLYCOL 3350 17 G PO PACK
17.0000 g | PACK | Freq: Every day | ORAL | Status: DC | PRN
  Administered 2018-10-14 – 2018-10-17 (×2): 17 g via ORAL
  Filled 2018-10-12 (×2): qty 1

## 2018-10-12 MED ORDER — ACETAMINOPHEN 650 MG RE SUPP: 650.0000 mg | Freq: Four times a day (QID) | RECTAL | Status: DC | PRN

## 2018-10-12 MED ORDER — OMEGA-3-ACID ETHYL ESTERS 1 G PO CAPS
1.0000 g | ORAL_CAPSULE | Freq: Every day | ORAL | Status: DC
  Administered 2018-10-13 – 2018-10-22 (×9): 1 g via ORAL
  Filled 2018-10-12 (×9): qty 1

## 2018-10-12 MED ORDER — METHOCARBAMOL 500 MG PO TABS: 500.0000 mg | ORAL_TABLET | Freq: Four times a day (QID) | ORAL | Status: DC | PRN

## 2018-10-12 MED ORDER — ONDANSETRON HCL 4 MG PO TABS: 4.0000 mg | ORAL_TABLET | Freq: Four times a day (QID) | ORAL | Status: DC | PRN

## 2018-10-12 MED ORDER — ONDANSETRON HCL 4 MG/2ML IJ SOLN: 4.0000 mg | Freq: Four times a day (QID) | INTRAMUSCULAR | Status: DC | PRN

## 2018-10-12 MED ORDER — SODIUM CHLORIDE 0.9 % IV SOLN
INTRAVENOUS | Status: DC
  Administered 2018-10-12: 22:00:00 via INTRAVENOUS

## 2018-10-12 MED ORDER — SENNA 8.6 MG PO TABS
1.0000 | ORAL_TABLET | Freq: Every day | ORAL | Status: DC
  Administered 2018-10-13 – 2018-10-22 (×9): 8.6 mg via ORAL
  Filled 2018-10-12 (×9): qty 1

## 2018-10-12 MED ORDER — HYDROCODONE-ACETAMINOPHEN 5-325 MG PO TABS
1.0000 | ORAL_TABLET | Freq: Once | ORAL | Status: AC
  Administered 2018-10-12: 1 via ORAL
  Filled 2018-10-12: qty 1

## 2018-10-12 NOTE — ED Notes (Signed)
[facility] contacted for transport  

## 2018-10-12 NOTE — ED Provider Notes (Signed)
[facility] DEPT Provider Note   CSN: [id number] Arrival date & time: 10/12/18  1150     History   Chief Complaint Chief Complaint  Patient presents with  . Back Pain    HPI Laurie Galloway is a 84 y.o. female.  HPI Patient presents for pain control from her back fracture.  Has a T12 compression fracture that is somewhat complex.  Had been seen by Dr. Trenton Gammon in the past for lumbar fusion.  Discussed with Dr. Ronnald Ramp yesterday.  Plan was a TLSO brace and pain control.  Patient is not managed well with pain control at home.  Took 2 Vicodin and states she could not tolerate it.  States the legs are feeling a little weaker but is mostly a pain issue.  No loss of bladder or bowel control.  However is not managing at home and states she will need better pain control and evaluation. Past Medical History:  Diagnosis Date  . Arthritis    hands  . Carpal tunnel syndrome of left wrist 08/2016  . Dental crowns present   . Family history of adverse reaction to anesthesia    pt's daughter has hx. of post-op N/V  . Hypertension    states under control with meds., has been on med. x "long time"  . PONV (postoperative nausea and vomiting)   . White coat hypertension     Patient Active Problem List   Diagnosis Date Noted  . Closed left hip fracture, initial encounter ([place]) 10/13/2017  . Hip fracture ([place]) 10/13/2017  . Cochlear implant in place 10/13/2017  . Subclinical hyperthyroidism 06/18/2013  . Small bowel obstruction, partial ([place]) 04/01/2012  . Small bowel obstruction ([place]) 01/07/2012  . HTN (hypertension) 01/07/2012  . Tachycardia 01/07/2012  . Gastrointestinal bleeding 01/07/2012  . Chronic back pain 01/07/2012  . Epigastric pain 01/07/2012    Past Surgical History:  Procedure Laterality Date  . ANTERIOR AND POSTERIOR VAGINAL REPAIR  11/08/2002  . APPENDECTOMY    . BOWEL RESECTION  01/08/2012   Procedure: SMALL BOWEL RESECTION;  Surgeon: Imogene Burn.  Georgette Dover, MD;  Location: [facility];  Service: General;  Laterality: N/A;  . CARPAL TUNNEL RELEASE Left 08/29/2016   Procedure: CARPAL TUNNEL RELEASE, left;  Surgeon: Leanora Cover, MD;  Location: [facility];  Service: Orthopedics;  Laterality: Left;  . CATARACT EXTRACTION W/ INTRAOCULAR LENS IMPLANT Left 01/09/2011  . CATARACT EXTRACTION W/ INTRAOCULAR LENS IMPLANT Right 03/06/2011  . COLONOSCOPY WITH PROPOFOL N/A 08/24/2013   Procedure: COLONOSCOPY WITH PROPOFOL;  Surgeon: Garlan Fair, MD;  Location: [facility];  Service: Endoscopy;  Laterality: N/A;  . ESOPHAGOGASTRODUODENOSCOPY (EGD) WITH PROPOFOL N/A 08/24/2013   Procedure: ESOPHAGOGASTRODUODENOSCOPY (EGD) WITH PROPOFOL;  Surgeon: Garlan Fair, MD;  Location: [facility];  Service: Endoscopy;  Laterality: N/A;  . EXTERNAL EAR SURGERY Bilateral    stapedectomy   . INTRAMEDULLARY (IM) NAIL INTERTROCHANTERIC Left 10/14/2017   Procedure: INTRAMEDULLARY (IM) NAIL INTERTROCHANTRIC;  Surgeon: Paralee Cancel, MD;  Location: [facility];  Service: Orthopedics;  Laterality: Left;  . LAPAROSCOPIC ASSISTED VAGINAL HYSTERECTOMY  11/08/2002  . LAPAROSCOPIC BILATERAL SALPINGO OOPHERECTOMY Bilateral 11/08/2002  . LAPAROSCOPIC LYSIS OF ADHESIONS  11/08/2002  . LAPAROTOMY  01/08/2012   Procedure: EXPLORATORY LAPAROTOMY;  Surgeon: Imogene Burn. Georgette Dover, MD;  Location: [facility];  Service: General;  Laterality: N/A;  . LUMBAR FUSION  03/19/2010   L3-4, L4-5  . LUMBAR LAMINECTOMY/DECOMPRESSION MICRODISCECTOMY  03/19/2010   L3-4, L4-5  . SHOULDER ARTHROSCOPY W/  ROTATOR CUFF REPAIR    . TONSILLECTOMY     age 56     OB History   None      Home Medications    Prior to Admission medications   Medication Sig Start Date End Date Taking? Authorizing Provider  acetaminophen (TYLENOL) 500 MG tablet Take 500-1,000 mg by mouth 2 (two) times daily as needed for mild pain.    Yes [provider]  betamethasone valerate (VALISONE) 0.1 % cream Apply 1  application topically 2 (two) times daily as needed (itching).  07/01/13  Yes [provider]  Calcium Carb-Cholecalciferol (CALCIUM+D3) 600-800 MG-UNIT TABS Take 1 tablet by mouth 2 (two) times daily with a meal.   Yes [provider]  Cholecalciferol (VITAMIN D) 2000 units tablet Take 2,000 Units by mouth daily.   Yes [provider]  clobetasol cream (TEMOVATE) 0.86 % Apply 1 application topically 2 (two) times daily. 07/27/18  Yes [provider]  denosumab (PROLIA) 60 MG/ML SOSY injection Inject 60 mg into the skin every 6 (six) months.   Yes [provider]  HYDROcodone-acetaminophen (NORCO/VICODIN) 5-325 MG tablet Take 2 tablets by mouth every 6 (six) hours as needed. 10/11/18  Yes Dorie Rank, MD  losartan (COZAAR) 100 MG tablet Take 100 mg by mouth every morning.    Yes [provider]  meloxicam (MOBIC) 15 MG tablet Take 15 mg by mouth daily. 06/22/18  Yes [provider]  methocarbamol (ROBAXIN) 500 MG tablet Take 500 mg by mouth daily. 09/30/18  Yes [provider]  Misc Natural Products (GLUCOSAMINE CHONDROITIN TRIPLE PO) Take 1 capsule by mouth 2 (two) times daily.    Yes [provider]  Multiple Vitamin (MULTIVITAMIN) tablet Take 1 tablet by mouth daily with supper.    Yes [provider]  Multiple Vitamins-Minerals (PRESERVISION AREDS 2 PO) Take 1 tablet by mouth 2 (two) times daily.   Yes [provider]  Omega-3 Fatty Acids (FISH OIL PO) Take 1 capsule by mouth daily.    Yes [provider]  Polyethyl Glycol-Propyl Glycol (SYSTANE) 0.4-0.3 % SOLN Place 1 drop into both eyes 4 (four) times daily as needed (dry eyes).    Yes [provider]  polyethylene glycol (MIRALAX / GLYCOLAX) packet Take 17 g daily by mouth. Patient taking differently: Take 17 g by mouth daily as needed for mild constipation.  10/18/17  Yes Florencia Reasons, MD  verapamil (VERELAN PM) 360 MG 24 hr capsule  Take 360 mg by mouth every morning.    Yes [provider]  traMADol (ULTRAM) 50 MG tablet Take 1 tablet (50 mg total) by mouth every 6 (six) hours as needed for moderate pain or severe pain. Patient not taking: Reported on 10/12/2018 07/28/18   Julianne Rice, MD    Family History Family History  Problem Relation Age of Onset  . Anesthesia problems Daughter        post-op N/V    Social History Social History   Tobacco Use  . Smoking status: Never Smoker  . Smokeless tobacco: Never Used  Substance Use Topics  . Alcohol use: No  . Drug use: No     Allergies   Penicillins; Sulfa antibiotics; and Sulfacetamide sodium   Review of Systems Review of Systems  Constitutional: Negative for appetite change.  Respiratory: Negative for shortness of breath.   Cardiovascular: Negative for chest pain.  Gastrointestinal: Negative for abdominal pain.  Genitourinary: Negative for enuresis.  Musculoskeletal: Positive for back pain.  Skin: Negative for rash.  Neurological: Negative for weakness and numbness.  Psychiatric/Behavioral: Negative for confusion.     Physical Exam Updated Vital Signs BP (!) 141/75   Pulse 77   Temp 98.9 F (37.2 C)   Resp 20   SpO2 95%   Physical Exam  Constitutional: She appears well-developed.  HENT:  Head: Normocephalic.  Eyes: EOM are normal.  Neck: Neck supple.  Cardiovascular: Normal rate.  Pulmonary/Chest: Effort normal.  Abdominal: Soft.  Musculoskeletal: She exhibits tenderness.  Lower thoracic spine tenderness.  Neurological: She is alert.  Sensation intact over both lower extremities.  No clonus.  Some pain with straight leg raise bilaterally.  Good plantar and dorsiflexion of feet.  Perineal sensation intact.  Skin: Skin is warm. Capillary refill takes less than 2 seconds.     ED Treatments / Results  Labs (all labs ordered are listed, but only abnormal results are displayed) Labs Reviewed - No data to  display  EKG None  Radiology Ct Lumbar Spine Wo Contrast  Result Date: 10/11/2018 CLINICAL DATA:  Fall 6 weeks ago.  Back pain. EXAM: CT LUMBAR SPINE WITHOUT CONTRAST TECHNIQUE: Multidetector CT imaging of the lumbar spine was performed without intravenous contrast administration. Multiplanar CT image reconstructions were also generated. COMPARISON:  07/28/2018 radiographs FINDINGS: Segmentation: The lowest lumbar type non-rib-bearing vertebra is labeled as L5. Alignment: 9 mm anterolisthesis at L3-4, chronic. 9 mm anterolisthesis at L4-5, chronic. Vertebrae: Severe compression fracture at T12 with 90% loss of height including a horizontal component, an oblique component extending to both superior and inferior endplates, with fracture extending into the bases of both pedicles but no well-defined fracture involving the lamina or spinous process. There is 11 mm of posterior bony retropulsion at this level, narrowing the AP diameter of the thecal sac to 8 mm compatible with moderate central narrowing of the thecal sac the bony retropulsion is mildly eccentric to the left. Acute left transverse process fracture at L1. Compression fractures at L2, L3, and L4 are roughly similar to the 07/28/2018 exam. Posterolateral rod and pedicle screw fixation at L3-L4-L5. Paraspinal and other soft tissues: Aortoiliac atherosclerotic vascular disease. 3.1 by 1.8 cm right adrenal adenoma. Sigmoid colon diverticulosis. Disc levels: As noted above there is at least moderate central narrowing of the thecal sac at the T12 level and potentially some mild right foraminal stenosis due to spurring at T11-12. L1-2: No impingement.  Disc bulge. L2-3: Prominent left and moderate right foraminal stenosis due to disc bulge and facet and intervertebral spurring. L3-4: Suspected mild left foraminal stenosis due to spurring and subluxation. L4-5: Unremarkable.  Posterior decompression. L5-S1: Mild bilateral foraminal stenosis due to  intervertebral spurring and disc bulge. IMPRESSION: 1. The dominant finding is a severe compression fracture at T12 involving all 3 columns, with 90% loss of height and 11 mm of posterior bony retropulsion leading to at least moderate central narrowing of the thecal sac. There fractures through the pedicles as well as both transverse and oblique fractures through the vertebral body. This is new compared to 07/28/2018. 2. Acute left transverse process fracture at L1. 3. Old compression fractures at L2, L3, and L4 with posterolateral rod and pedicle screw fixation and posterior decompression. 4. Lumbar spondylosis and degenerative disc disease causing prominent impingement at L2-3, and mild impingement at L3-4 and L5-S1 as detailed above. This is in addition to the impingement from bony retropulsion at the T12 vertebral level. 5. Right adrenal adenoma. 6.  Aortic Atherosclerosis (ICD10-I70.0). 7. Sigmoid colon  diverticulosis. Electronically Signed   By: Van Clines M.D.   On: 10/11/2018 11:54    Procedures Procedures (including critical care time)  Medications Ordered in ED Medications - No data to display   Initial Impression / Assessment and Plan / ED Course  I have reviewed the triage vital signs and the nursing notes.  Pertinent labs & imaging results that were available during my care of the patient were reviewed by me and considered in my medical decision making (see chart for details).     Patient with back pain.  Known T12 compression fracture.  Discussed with neurosurgery.  States that patient benefit from admission to the medical doctors with transfer to [facility].  Should have consult tomorrow from either Dr. Trenton Gammon or Dr. Ronnald Ramp.  Will admit to internal medicine.  Has not had adequate pain relief with pain medicines and TLSO brace.  Patient cannot get an MRI.  Final Clinical Impressions(s) / ED Diagnoses   Final diagnoses:  Compression fracture of T12 vertebra, initial  encounter [facility])    ED Discharge Orders    None       Davonna Belling, MD 10/12/18 1925

## 2018-10-12 NOTE — ED Triage Notes (Addendum)
Per pt diagnosed with T12 fracture; returned from visit yesterday related to "they offered for me to stay for pain management but I went home; I think I made a mistake." Per pt here for admission for pain control.

## 2018-10-12 NOTE — ED Notes (Signed)
Pt's daughter, Eugene Garnet, can be reached at [phone] for any updates on pt's condition.

## 2018-10-12 NOTE — ED Notes (Signed)
Spoke with Lonn Georgia, RN [facility] for brief report.

## 2018-10-12 NOTE — H&P (Signed)
History and Physical    Laurie Galloway MGQ:[id number] DOB: May 04, 1930 DOA: 10/12/2018  PCP: Mayra Neer, MD  Patient coming from: Home.  Chief Complaint: Low back pain difficulty to walk.  HPI: Laurie Galloway is a 84 y.o. female with history of hypertension has been experiencing worsening low back pain over the last 3 weeks.  Patient states she had a fall towards the end of last week of August 3 months ago.  Since then patient has been following with orthopedics and has got prednisone tapering dose recently.  Despite taking which patient's pain has been worsening.  Patient's pain acutely worsened 3 weeks ago with pain radiating from low back to her both thighs.  No incontinence of urine.  Patient has noticed increasingly difficulty walking.  Patient had some to the ER yesterday and had CT of the lumbar spine done which showed acute fracture of the T12 and transverse process of L1.  At that time ER physician I discussed with on-call neurosurgeon who advised T SLO brace and follow-up with Dr. Trenton Gammon at home patient has had previous surgery.  Despite which patient's pain has not improved and has come back to the ER today.  ED Course: On exam patient is not in distress.  Complains of significant pain on minimal movement.  Was placed on hydrocodone 1 dose and ER physician discussed with on-call neurosurgeon who advised patient to be admitted and Dr. Trenton Gammon to be consulted in the morning.  Review of Systems: As per HPI, rest all negative.   Past Medical History:  Diagnosis Date  . Arthritis    hands  . Carpal tunnel syndrome of left wrist 08/2016  . Dental crowns present   . Family history of adverse reaction to anesthesia    pt's daughter has hx. of post-op N/V  . Hypertension    states under control with meds., has been on med. x "long time"  . PONV (postoperative nausea and vomiting)   . White coat hypertension     Past Surgical History:  Procedure Laterality Date  . ANTERIOR AND  POSTERIOR VAGINAL REPAIR  11/08/2002  . APPENDECTOMY    . BOWEL RESECTION  01/08/2012   Procedure: SMALL BOWEL RESECTION;  Surgeon: Imogene Burn. Georgette Dover, MD;  Location: [facility];  Service: General;  Laterality: N/A;  . CARPAL TUNNEL RELEASE Left 08/29/2016   Procedure: CARPAL TUNNEL RELEASE, left;  Surgeon: Leanora Cover, MD;  Location: [facility];  Service: Orthopedics;  Laterality: Left;  . CATARACT EXTRACTION W/ INTRAOCULAR LENS IMPLANT Left 01/09/2011  . CATARACT EXTRACTION W/ INTRAOCULAR LENS IMPLANT Right 03/06/2011  . COLONOSCOPY WITH PROPOFOL N/A 08/24/2013   Procedure: COLONOSCOPY WITH PROPOFOL;  Surgeon: Garlan Fair, MD;  Location: [facility];  Service: Endoscopy;  Laterality: N/A;  . ESOPHAGOGASTRODUODENOSCOPY (EGD) WITH PROPOFOL N/A 08/24/2013   Procedure: ESOPHAGOGASTRODUODENOSCOPY (EGD) WITH PROPOFOL;  Surgeon: Garlan Fair, MD;  Location: [facility];  Service: Endoscopy;  Laterality: N/A;  . EXTERNAL EAR SURGERY Bilateral    stapedectomy   . INTRAMEDULLARY (IM) NAIL INTERTROCHANTERIC Left 10/14/2017   Procedure: INTRAMEDULLARY (IM) NAIL INTERTROCHANTRIC;  Surgeon: Paralee Cancel, MD;  Location: [facility];  Service: Orthopedics;  Laterality: Left;  . LAPAROSCOPIC ASSISTED VAGINAL HYSTERECTOMY  11/08/2002  . LAPAROSCOPIC BILATERAL SALPINGO OOPHERECTOMY Bilateral 11/08/2002  . LAPAROSCOPIC LYSIS OF ADHESIONS  11/08/2002  . LAPAROTOMY  01/08/2012   Procedure: EXPLORATORY LAPAROTOMY;  Surgeon: Imogene Burn. Georgette Dover, MD;  Location: [facility];  Service: General;  Laterality: N/A;  .  LUMBAR FUSION  03/19/2010   L3-4, L4-5  . LUMBAR LAMINECTOMY/DECOMPRESSION MICRODISCECTOMY  03/19/2010   L3-4, L4-5  . SHOULDER ARTHROSCOPY W/ ROTATOR CUFF REPAIR    . TONSILLECTOMY     age 80     reports that she has never smoked. She has never used smokeless tobacco. She reports that she does not drink alcohol or use drugs.  Allergies  Allergen Reactions  . Penicillins Hives and Rash    Many  years ago Has patient had a PCN reaction causing immediate rash, facial/tongue/throat swelling, SOB or lightheadedness with hypotension: No Has patient had a PCN reaction causing severe rash involving mucus membranes or skin necrosis: No Has patient had a PCN reaction that required hospitalization: No Has patient had a PCN reaction occurring within the last 10 years: No If all of the above answers are "NO", then may proceed with Cephalosporin use.   . Sulfa Antibiotics Rash  . Sulfacetamide Sodium Rash    Family History  Problem Relation Age of Onset  . Anesthesia problems Daughter        post-op N/V    Prior to Admission medications   Medication Sig Start Date End Date Taking? Authorizing Provider  acetaminophen (TYLENOL) 500 MG tablet Take 500-1,000 mg by mouth 2 (two) times daily as needed for mild pain.    Yes [provider]  betamethasone valerate (VALISONE) 0.1 % cream Apply 1 application topically 2 (two) times daily as needed (itching).  07/01/13  Yes [provider]  Calcium Carb-Cholecalciferol (CALCIUM+D3) 600-800 MG-UNIT TABS Take 1 tablet by mouth 2 (two) times daily with a meal.   Yes [provider]  Cholecalciferol (VITAMIN D) 2000 units tablet Take 2,000 Units by mouth daily.   Yes [provider]  clobetasol cream (TEMOVATE) 2.70 % Apply 1 application topically 2 (two) times daily. 07/27/18  Yes [provider]  denosumab (PROLIA) 60 MG/ML SOSY injection Inject 60 mg into the skin every 6 (six) months.   Yes [provider]  HYDROcodone-acetaminophen (NORCO/VICODIN) 5-325 MG tablet Take 2 tablets by mouth every 6 (six) hours as needed. 10/11/18  Yes Dorie Rank, MD  losartan (COZAAR) 100 MG tablet Take 100 mg by mouth every morning.    Yes [provider]  meloxicam (MOBIC) 15 MG tablet Take 15 mg by mouth daily. 06/22/18  Yes [provider]  methocarbamol (ROBAXIN) 500 MG tablet Take 500 mg by mouth  daily. 09/30/18  Yes [provider]  Misc Natural Products (GLUCOSAMINE CHONDROITIN TRIPLE PO) Take 1 capsule by mouth 2 (two) times daily.    Yes [provider]  Multiple Vitamin (MULTIVITAMIN) tablet Take 1 tablet by mouth daily with supper.    Yes [provider]  Multiple Vitamins-Minerals (PRESERVISION AREDS 2 PO) Take 1 tablet by mouth 2 (two) times daily.   Yes [provider]  Omega-3 Fatty Acids (FISH OIL PO) Take 1 capsule by mouth daily.    Yes [provider]  Polyethyl Glycol-Propyl Glycol (SYSTANE) 0.4-0.3 % SOLN Place 1 drop into both eyes 4 (four) times daily as needed (dry eyes).    Yes [provider]  polyethylene glycol (MIRALAX / GLYCOLAX) packet Take 17 g daily by mouth. Patient taking differently: Take 17 g by mouth daily as needed for mild constipation.  10/18/17  Yes Florencia Reasons, MD  verapamil (VERELAN PM) 360 MG 24 hr capsule Take 360 mg by mouth every morning.    Yes [provider]  traMADol (  ULTRAM) 50 MG tablet Take 1 tablet (50 mg total) by mouth every 6 (six) hours as needed for moderate pain or severe pain. Patient not taking: Reported on 10/12/2018 07/28/18   Julianne Rice, MD    Physical Exam: Vitals:   10/12/18 1930 10/12/18 1945 10/12/18 2000 10/12/18 2028  BP: (!) 152/75 (!) 158/80  (!) 162/76  Pulse: 86 78 89 85  Resp: 16 20 (!) 25 18  Temp:      SpO2: 95% 94% 97% 99%      Constitutional: Moderately built and nourished. Vitals:   10/12/18 1930 10/12/18 1945 10/12/18 2000 10/12/18 2028  BP: (!) 152/75 (!) 158/80  (!) 162/76  Pulse: 86 78 89 85  Resp: 16 20 (!) 25 18  Temp:      SpO2: 95% 94% 97% 99%   Eyes: Anicteric no pallor. ENMT: No discharge from the ears eyes nose or mouth. Neck: No mass felt.  No neck rigidity. Respiratory: No rhonchi or crepitations. Cardiovascular: S1-S2 heard no murmurs appreciated. Abdomen: Soft nontender bowel sounds present. Musculoskeletal: No  edema.  No joint effusion. Skin: No rash. Neurologic: Alert awake oriented to time place and person.  Moves all extremities but has pain on moving lower extremities. Psychiatric: Appears normal.  Normal affect.   Labs on Admission: I have personally reviewed following labs and imaging studies  CBC: Recent Labs  Lab 10/11/18 1122  WBC 11.8*  HGB 13.5  HCT 41.9  MCV 99.5  PLT 277   Basic Metabolic Panel: Recent Labs  Lab 10/11/18 1122  NA 130*  K 4.4  CL 96*  CO2 27  GLUCOSE 106*  BUN 28*  CREATININE 0.52  CALCIUM 8.7*   GFR: Estimated Creatinine Clearance: 41.8 mL/min (by C-G formula based on SCr of 0.52 mg/dL). Liver Function Tests: No results for input(s): AST, ALT, ALKPHOS, BILITOT, PROT, ALBUMIN in the last 168 hours. No results for input(s): LIPASE, AMYLASE in the last 168 hours. No results for input(s): AMMONIA in the last 168 hours. Coagulation Profile: Recent Labs  Lab 10/11/18 1122  INR 0.99   Cardiac Enzymes: No results for input(s): CKTOTAL, CKMB, CKMBINDEX, TROPONINI in the last 168 hours. BNP (last 3 results) No results for input(s): PROBNP in the last 8760 hours. HbA1C: No results for input(s): HGBA1C in the last 72 hours. CBG: No results for input(s): GLUCAP in the last 168 hours. Lipid Profile: No results for input(s): CHOL, HDL, LDLCALC, TRIG, CHOLHDL, LDLDIRECT in the last 72 hours. Thyroid Function Tests: No results for input(s): TSH, T4TOTAL, FREET4, T3FREE, THYROIDAB in the last 72 hours. Anemia Panel: No results for input(s): VITAMINB12, FOLATE, FERRITIN, TIBC, IRON, RETICCTPCT in the last 72 hours. Urine analysis:    Component Value Date/Time   COLORURINE YELLOW 04/01/2012 0642   APPEARANCEUR CLOUDY (A) 04/01/2012 0642   LABSPEC 1.023 04/01/2012 0642   PHURINE 7.0 04/01/2012 0642   GLUCOSEU NEGATIVE 04/01/2012 0642   HGBUR NEGATIVE 04/01/2012 0642   BILIRUBINUR NEGATIVE 04/01/2012 0642   KETONESUR 40 (A) 04/01/2012 0642    PROTEINUR NEGATIVE 04/01/2012 0642   UROBILINOGEN 0.2 04/01/2012 0642   NITRITE NEGATIVE 04/01/2012 0642   LEUKOCYTESUR NEGATIVE 04/01/2012 0642   Sepsis Labs: @LABRCNTIP (procalcitonin:4,lacticidven:4) )No results found for this or any previous visit (from the past 240 hour(s)).   Radiological Exams on Admission: Ct Lumbar Spine Wo Contrast  Result Date: 10/11/2018 CLINICAL DATA:  Fall 6 weeks ago.  Back pain. EXAM: CT LUMBAR SPINE WITHOUT CONTRAST TECHNIQUE: Multidetector CT imaging of  the lumbar spine was performed without intravenous contrast administration. Multiplanar CT image reconstructions were also generated. COMPARISON:  07/28/2018 radiographs FINDINGS: Segmentation: The lowest lumbar type non-rib-bearing vertebra is labeled as L5. Alignment: 9 mm anterolisthesis at L3-4, chronic. 9 mm anterolisthesis at L4-5, chronic. Vertebrae: Severe compression fracture at T12 with 90% loss of height including a horizontal component, an oblique component extending to both superior and inferior endplates, with fracture extending into the bases of both pedicles but no well-defined fracture involving the lamina or spinous process. There is 11 mm of posterior bony retropulsion at this level, narrowing the AP diameter of the thecal sac to 8 mm compatible with moderate central narrowing of the thecal sac the bony retropulsion is mildly eccentric to the left. Acute left transverse process fracture at L1. Compression fractures at L2, L3, and L4 are roughly similar to the 07/28/2018 exam. Posterolateral rod and pedicle screw fixation at L3-L4-L5. Paraspinal and other soft tissues: Aortoiliac atherosclerotic vascular disease. 3.1 by 1.8 cm right adrenal adenoma. Sigmoid colon diverticulosis. Disc levels: As noted above there is at least moderate central narrowing of the thecal sac at the T12 level and potentially some mild right foraminal stenosis due to spurring at T11-12. L1-2: No impingement.  Disc bulge. L2-3:  Prominent left and moderate right foraminal stenosis due to disc bulge and facet and intervertebral spurring. L3-4: Suspected mild left foraminal stenosis due to spurring and subluxation. L4-5: Unremarkable.  Posterior decompression. L5-S1: Mild bilateral foraminal stenosis due to intervertebral spurring and disc bulge. IMPRESSION: 1. The dominant finding is a severe compression fracture at T12 involving all 3 columns, with 90% loss of height and 11 mm of posterior bony retropulsion leading to at least moderate central narrowing of the thecal sac. There fractures through the pedicles as well as both transverse and oblique fractures through the vertebral body. This is new compared to 07/28/2018. 2. Acute left transverse process fracture at L1. 3. Old compression fractures at L2, L3, and L4 with posterolateral rod and pedicle screw fixation and posterior decompression. 4. Lumbar spondylosis and degenerative disc disease causing prominent impingement at L2-3, and mild impingement at L3-4 and L5-S1 as detailed above. This is in addition to the impingement from bony retropulsion at the T12 vertebral level. 5. Right adrenal adenoma. 6.  Aortic Atherosclerosis (ICD10-I70.0). 7. Sigmoid colon diverticulosis. Electronically Signed   By: Van Clines M.D.   On: 10/11/2018 11:54      Assessment/Plan Principal Problem:   Compression fracture of body of thoracic vertebra (HCC) Active Problems:   HTN (hypertension)   Back pain    1. Low back pain secondary to acute fractures of the T12 and transverse process of L1 vertebrae -patient has been placed on oxycodone and Robaxin.  Consult Dr. Trenton Gammon patient's neurosurgeon in the morning.  Added senna because of patient having some constipation now. 2. Hypertension on verapamil and Cozaar which should be continued. 3. Mild hyponatremia recheck metabolic panel closely follow intake output.   DVT prophylaxis: SCDs in anticipation of possible procedure. Code Status:  DNR. Family Communication: Patient's daughter. Disposition Plan: To be determined. Consults called: ER physician discussed with on-call neurosurgeon. Admission status: Observation.   Rise Patience MD [facility] Pager [phone].  If 7PM-7AM, please contact night-coverage [email or web address] Password [facility]  10/12/2018, 8:56 PM

## 2018-10-13 ENCOUNTER — Other Ambulatory Visit: Payer: Self-pay

## 2018-10-13 ENCOUNTER — Other Ambulatory Visit: Payer: Self-pay | Admitting: Neurosurgery

## 2018-10-13 ENCOUNTER — Observation Stay (HOSPITAL_COMMUNITY): Payer: Medicare HMO

## 2018-10-13 DIAGNOSIS — M4804 Spinal stenosis, thoracic region: Secondary | ICD-10-CM | POA: Diagnosis not present

## 2018-10-13 DIAGNOSIS — M8008XA Age-related osteoporosis with current pathological fracture, vertebra(e), initial encounter for fracture: Secondary | ICD-10-CM | POA: Diagnosis not present

## 2018-10-13 DIAGNOSIS — S22089A Unspecified fracture of T11-T12 vertebra, initial encounter for closed fracture: Secondary | ICD-10-CM | POA: Diagnosis not present

## 2018-10-13 DIAGNOSIS — S22000A Wedge compression fracture of unspecified thoracic vertebra, initial encounter for closed fracture: Secondary | ICD-10-CM | POA: Diagnosis not present

## 2018-10-13 LAB — BASIC METABOLIC PANEL
Anion gap: 6 (ref 5–15)
BUN: 22 mg/dL (ref 8–23)
CO2: 24 mmol/L (ref 22–32)
Calcium: 8.1 mg/dL — ABNORMAL LOW (ref 8.9–10.3)
Chloride: 100 mmol/L (ref 98–111)
Creatinine, Ser: 0.61 mg/dL (ref 0.44–1.00)
GFR calc Af Amer: >60 mL/min (ref >60)
GFR calc non Af Amer: >60 mL/min (ref >60)
Glucose, Bld: 97 mg/dL (ref 70–99)
Potassium: 3.8 mmol/L (ref 3.5–5.1)
Sodium: 130 mmol/L — ABNORMAL LOW (ref 135–145)

## 2018-10-13 LAB — CBC
HCT: 37 % (ref 36.0–46.0)
Hemoglobin: 12.2 g/dL (ref 12.0–15.0)
MCH: 31.5 pg (ref 26.0–34.0)
MCHC: 33 g/dL (ref 30.0–36.0)
MCV: 95.6 fL (ref 80.0–100.0)
Platelets: 291 10*3/uL (ref 150–400)
RBC: 3.87 MIL/uL (ref 3.87–5.11)
RDW: 15.8 % — ABNORMAL HIGH (ref 11.5–15.5)
WBC: 11.1 10*3/uL — ABNORMAL HIGH (ref 4.0–10.5)
nRBC: 0 % (ref 0.0–0.2)

## 2018-10-13 MED ORDER — MORPHINE SULFATE (PF) 2 MG/ML IV SOLN
1.0000 mg | INTRAVENOUS | Status: DC | PRN
  Administered 2018-10-14 – 2018-10-17 (×3): 1 mg via INTRAVENOUS
  Filled 2018-10-13 (×3): qty 1

## 2018-10-13 MED ORDER — ACETAMINOPHEN 500 MG PO TABS
1000.0000 mg | ORAL_TABLET | Freq: Four times a day (QID) | ORAL | Status: DC
  Administered 2018-10-13 – 2018-10-22 (×31): 1000 mg via ORAL
  Filled 2018-10-13 (×34): qty 2

## 2018-10-13 MED ORDER — METHOCARBAMOL 500 MG PO TABS
500.0000 mg | ORAL_TABLET | Freq: Four times a day (QID) | ORAL | Status: DC
  Administered 2018-10-13 – 2018-10-22 (×33): 500 mg via ORAL
  Filled 2018-10-13 (×33): qty 1

## 2018-10-13 NOTE — NC FL2 (Signed)
[employer] MEDICAID FL2 LEVEL OF CARE SCREENING TOOL     IDENTIFICATION  Patient Name: Laurie Galloway Birthdate: June 27, 1930 Sex: female Admission Date (Current Location): 10/12/2018  [facility] and [place] Number:  Herbalist and Address:  [facility] [place] [address], [place], [place] [address]      Provider Number: [id number]  Attending Physician Name and Address:  Geradine Girt, DO  Relative Name and Phone Number:  Larena Sox- [phone]    Current Level of Care: Hospital Recommended Level of Care: [place] Prior Approval Number:    Date Approved/Denied:   PASRR Number: [id number]  Discharge Plan: SNF    Current Diagnoses: Patient Active Problem List   Diagnosis Date Noted  . Compression fracture of body of thoracic vertebra (HCC) 10/12/2018  . Back pain 10/12/2018  . Closed left hip fracture, initial encounter ([place]) 10/13/2017  . Hip fracture ([place]) 10/13/2017  . Cochlear implant in place 10/13/2017  . Subclinical hyperthyroidism 06/18/2013  . Small bowel obstruction, partial ([place]) 04/01/2012  . Small bowel obstruction ([place]) 01/07/2012  . HTN (hypertension) 01/07/2012  . Tachycardia 01/07/2012  . Gastrointestinal bleeding 01/07/2012  . Chronic back pain 01/07/2012  . Epigastric pain 01/07/2012    Orientation RESPIRATION BLADDER Height & Weight     Self, Time, Situation, Place  Normal Incontinent, External catheter Weight:   Height:     BEHAVIORAL SYMPTOMS/MOOD NEUROLOGICAL BOWEL NUTRITION STATUS      Continent Diet(please see DC summary)  AMBULATORY STATUS COMMUNICATION OF NEEDS Skin   Extensive Assist Verbally Other (Comment)(Non-pressure wound - left/right sacrum)                       Personal Care Assistance Level of Assistance  Bathing, Feeding, Dressing Bathing Assistance: Limited assistance Feeding assistance: Independent Dressing Assistance: Limited assistance      Functional Limitations Info  Sight, Hearing, Speech Sight Info: Adequate Hearing Info: Adequate Speech Info: Adequate    SPECIAL CARE FACTORS FREQUENCY  PT (By licensed PT), OT (By licensed OT)     PT Frequency: 5x/weekly OT Frequency: 5x/weekly            Contractures Contractures Info: Not present    Additional Factors Info  Code Status, Allergies Code Status Info: DNR Allergies Info: PENICILLINS, SULFA ANTIBIOTICS, SULFACETAMIDE SODIUM            Current Medications (10/13/2018):  This is the current hospital active medication list Current Facility-Administered Medications  Medication Dose Route Frequency Provider Last Rate Last Dose  . acetaminophen (TYLENOL) tablet 1,000 mg  1,000 mg Oral QID Vann, Jessica U, DO      . losartan (COZAAR) tablet 100 mg  100 mg Oral q morning - 10a Rise Patience, MD   100 mg at 10/13/18 0951  . methocarbamol (ROBAXIN) tablet 500 mg  500 mg Oral QID Vann, Jessica U, DO      . morphine 2 MG/ML injection 1 mg  1 mg Intravenous Q4H PRN Eliseo Squires, Jessica U, DO      . omega-3 acid ethyl esters (LOVAZA) capsule 1 g  1 g Oral Daily Rise Patience, MD   1 g at 10/13/18 0953  . ondansetron (ZOFRAN) tablet 4 mg  4 mg Oral Q6H PRN Rise Patience, MD       Or  . ondansetron [facility]) injection 4 mg  4 mg Intravenous Q6H PRN Rise Patience, MD      .  oxyCODONE (Oxy IR/ROXICODONE) immediate release tablet 5 mg  5 mg Oral Q4H PRN Rise Patience, MD   5 mg at 10/13/18 0844  . polyethylene glycol (MIRALAX / GLYCOLAX) packet 17 g  17 g Oral Daily PRN Rise Patience, MD      . senna [facility]) tablet 8.6 mg  1 tablet Oral Daily Rise Patience, MD   8.6 mg at 10/13/18 0952  . verapamil (CALAN-SR) CR tablet 360 mg  360 mg Oral q morning - 10a Rise Patience, MD   360 mg at 10/13/18 8550     Discharge Medications: Please see discharge summary for a list of discharge medications.  Relevant Imaging  Results:  Relevant Lab Results:   Additional Information SSN: [id number]  Estanislado Emms, LCSW

## 2018-10-13 NOTE — Progress Notes (Signed)
Pt returned from CT. Pt stable, no acute distress noted.

## 2018-10-13 NOTE — Progress Notes (Addendum)
Pt off unit for CT. Pt stable upon transfer. No acute distress noted. Vitals WDL.

## 2018-10-13 NOTE — Evaluation (Signed)
Physical Therapy Evaluation Patient Details Name: Laurie Galloway MRN: [id number] DOB: [birth year] Today's Date: 10/13/2018   History of Present Illness  Pt is an 84 y/o female admitted secondary to worsening back pain. Pt with recent trip to ED and was found to have T12 compression fracture, and transverse process fracture at L1. Imaging also revelaed old compression fractures at L2-4. PMH includes HTN and lumbar surgery.   Clinical Impression  Pt admitted secondary to problem above with deficits below. MD cleared for participation in session. Pt very limited by pain in back and BLE. Pt reports pain as "shooting" pain down into LEs. Required max A to perform stand pivot transfer X2 to [facility] and back to bed. Feel pt is at increased risk for falls and will require increased assist at d/c. Will continue to follow acutely to maximize functional mobility independence and safety.     Follow Up Recommendations SNF;Supervision/Assistance - 24 hour    Equipment Recommendations  None recommended by PT    Recommendations for Other Services OT consult     Precautions / Restrictions Precautions Precautions: Back;Fall Precaution Booklet Issued: No Precaution Comments: Reviewed back precautions with pt.  Required Braces or Orthoses: Spinal Brace Spinal Brace: Thoracolumbosacral orthotic(per previous notes ) Restrictions Weight Bearing Restrictions: No      Mobility  Bed Mobility Overal bed mobility: Needs Assistance Bed Mobility: Rolling;Sidelying to Sit;Sit to Sidelying Rolling: Mod assist Sidelying to sit: Mod assist     Sit to sidelying: Mod assist General bed mobility comments: Mod A for assist with LEs and trunk. Verbal cues for use of log roll technique to maintain back precautions.   Transfers Overall transfer level: Needs assistance Equipment used: 1 person hand held assist Transfers: Sit to/from [facility] Sit to Stand: Max assist Stand pivot transfers: Max  assist       General transfer comment: Max A for lift assist and steadying. Pt requiring max A for physical assist and steadying during stand pivot to [facility] and back to bed. Pt reporting increased pain in LEs and back with mobility tasks. PT stood in front of pt and had pt hold onto PT arms during transfer.   Ambulation/Gait                Stairs            Wheelchair Mobility    Modified Rankin (Stroke Patients Only)       Balance Overall balance assessment: Needs assistance Sitting-balance support: No upper extremity supported;Feet supported Sitting balance-Leahy Scale: Fair     Standing balance support: Bilateral upper extremity supported;During functional activity Standing balance-Leahy Scale: Poor Standing balance comment: Heavy reliance on UE and external support                              Pertinent Vitals/Pain Pain Assessment: 0-10 Pain Score: 10-Worst pain ever Pain Location: back and BLEs  Pain Descriptors / Indicators: Shooting;Sharp Pain Intervention(s): Limited activity within patient's tolerance;Monitored during session;Repositioned    Home Living Family/patient expects to be discharged to:: Private residence Living Arrangements: Children Available Help at Discharge: Family;Available PRN/intermittently Type of Home: House Home Access: Ramped entrance     Home Layout: One level Home Equipment: Tub bench;Walker - 4 wheels;Bedside commode;Wheelchair - manual Additional Comments: Pt's special needs daughter lives with pt, however, unable to physically assist.     Prior Function Level of Independence: Needs assistance   Gait / Transfers  Assistance Needed: Since having increased pain, has had difficulty ambulating and has required assist with use of walker. Prior to the last 3 weeks, pt was using rollator for ambulation.   ADL's / Homemaking Assistance Needed: Pt's daughter has been helping with ADL tasks.         Hand Dominance         Extremity/Trunk Assessment   Upper Extremity Assessment Upper Extremity Assessment: Defer to OT evaluation    Lower Extremity Assessment Lower Extremity Assessment: RLE deficits/detail;LLE deficits/detail;Generalized weakness RLE Deficits / Details: Pt reports shooting pain down RLE during mobility tasks.  LLE Deficits / Details: Pt reports shooting pain down RLE during mobility tasks.     Cervical / Trunk Assessment Cervical / Trunk Assessment: Kyphotic;Other exceptions Cervical / Trunk Exceptions: compression fractures   Communication   Communication: No difficulties  Cognition Arousal/Alertness: Awake/alert Behavior During Therapy: WFL for tasks assessed/performed Overall Cognitive Status: Within Functional Limits for tasks assessed                                        General Comments General comments (skin integrity, edema, etc.): Dr. Eliseo Squires cleared for PT session. Updated MD following session about mobility status and that pt's daughter requesting to speak with her.     Exercises     Assessment/Plan    PT Assessment Patient needs continued PT services  PT Problem List Decreased strength;Decreased balance;Decreased activity tolerance;Decreased mobility;Decreased knowledge of use of DME;Decreased knowledge of precautions;Pain       PT Treatment Interventions Gait training;DME instruction;Functional mobility training;Therapeutic activities;Therapeutic exercise;Balance training;Patient/family education    PT Goals (Current goals can be found in the Care Plan section)  Acute Rehab PT Goals Patient Stated Goal: to decrease pain PT Goal Formulation: With patient Time For Goal Achievement: 10/27/18 Potential to Achieve Goals: Fair    Frequency Min 2X/week   Barriers to discharge        Co-evaluation               AM-PAC PT "6 Clicks" Daily Activity  Outcome Measure Difficulty turning over in bed (including adjusting bedclothes,  sheets and blankets)?: Unable Difficulty moving from lying on back to sitting on the side of the bed? : Unable Difficulty sitting down on and standing up from a chair with arms (e.g., wheelchair, bedside commode, etc,.)?: Unable Help needed moving to and from a bed to chair (including a wheelchair)?: A Lot Help needed walking in hospital room?: Total Help needed climbing 3-5 steps with a railing? : Total 6 Click Score: 7    End of Session Equipment Utilized During Treatment: Gait belt Activity Tolerance: Patient limited by pain Patient left: in bed;with call bell/phone within reach;with family/visitor present Nurse Communication: Mobility status PT Visit Diagnosis: Unsteadiness on feet (R26.81);Repeated falls (R29.6);Muscle weakness (generalized) (M62.81);History of falling (Z91.81);Pain Pain - part of body: Leg(back, BLE )    Time: 5102-5852 PT Time Calculation (min) (ACUTE ONLY): 29 min   Charges:   PT Evaluation $PT Eval Moderate Complexity: 1 Mod PT Treatments $Therapeutic Activity: 8-22 mins        Leighton Ruff, PT, DPT  Acute Rehabilitation Services  Pager: [phone] Office: [phone]   Rudean Hitt 10/13/2018, 11:40 AM

## 2018-10-13 NOTE — Consult Note (Signed)
Reason for Consult:Back pain Referring Physician:  medicine  Laurie Galloway is an 84 y.o. female.  HPI:  84 year old female status post prior L3-L5 decompression and fusion surgery 8 years ago with good results.  Patient with multiple episodes of lumbar pain over the past few S. Patient with a significant fall 3 weeks ago with severe worsening of her pain.  Pain is been refractory to conservative management.  Patient's pain much increased with sitting up or standing.  Patient complains of pain radiating into the anterior aspects of her thighs.  Patient denies any new numbness, paresthesias or weakness.  She has no bowel or bladder dysfunction.  The patient has no history of malignancy.  Aside from her advanced age she is in reasonably good   Overall medical health.  Past Medical History:  Diagnosis Date  . Arthritis    hands  . Carpal tunnel syndrome of left wrist 08/2016  . Dental crowns present   . Family history of adverse reaction to anesthesia    pt's daughter has hx. of post-op N/V  . Hypertension    states under control with meds., has been on med. x "long time"  . PONV (postoperative nausea and vomiting)   . White coat hypertension     Past Surgical History:  Procedure Laterality Date  . ANTERIOR AND POSTERIOR VAGINAL REPAIR  11/08/2002  . APPENDECTOMY    . BOWEL RESECTION  01/08/2012   Procedure: SMALL BOWEL RESECTION;  Surgeon: Imogene Burn. Georgette Dover, MD;  Location: [facility];  Service: General;  Laterality: N/A;  . CARPAL TUNNEL RELEASE Left 08/29/2016   Procedure: CARPAL TUNNEL RELEASE, left;  Surgeon: Leanora Cover, MD;  Location: [facility];  Service: Orthopedics;  Laterality: Left;  . CATARACT EXTRACTION W/ INTRAOCULAR LENS IMPLANT Left 01/09/2011  . CATARACT EXTRACTION W/ INTRAOCULAR LENS IMPLANT Right 03/06/2011  . COLONOSCOPY WITH PROPOFOL N/A 08/24/2013   Procedure: COLONOSCOPY WITH PROPOFOL;  Surgeon: Garlan Fair, MD;  Location: [facility];  Service: Endoscopy;   Laterality: N/A;  . ESOPHAGOGASTRODUODENOSCOPY (EGD) WITH PROPOFOL N/A 08/24/2013   Procedure: ESOPHAGOGASTRODUODENOSCOPY (EGD) WITH PROPOFOL;  Surgeon: Garlan Fair, MD;  Location: [facility];  Service: Endoscopy;  Laterality: N/A;  . EXTERNAL EAR SURGERY Bilateral    stapedectomy   . INTRAMEDULLARY (IM) NAIL INTERTROCHANTERIC Left 10/14/2017   Procedure: INTRAMEDULLARY (IM) NAIL INTERTROCHANTRIC;  Surgeon: Paralee Cancel, MD;  Location: [facility];  Service: Orthopedics;  Laterality: Left;  . LAPAROSCOPIC ASSISTED VAGINAL HYSTERECTOMY  11/08/2002  . LAPAROSCOPIC BILATERAL SALPINGO OOPHERECTOMY Bilateral 11/08/2002  . LAPAROSCOPIC LYSIS OF ADHESIONS  11/08/2002  . LAPAROTOMY  01/08/2012   Procedure: EXPLORATORY LAPAROTOMY;  Surgeon: Imogene Burn. Georgette Dover, MD;  Location: [facility];  Service: General;  Laterality: N/A;  . LUMBAR FUSION  03/19/2010   L3-4, L4-5  . LUMBAR LAMINECTOMY/DECOMPRESSION MICRODISCECTOMY  03/19/2010   L3-4, L4-5  . SHOULDER ARTHROSCOPY W/ ROTATOR CUFF REPAIR    . TONSILLECTOMY     age 31    Family History  Problem Relation Age of Onset  . Anesthesia problems Daughter        post-op N/V    Social History:  reports that she has never smoked. She has never used smokeless tobacco. She reports that she does not drink alcohol or use drugs.  Allergies:  Allergies  Allergen Reactions  . Penicillins Hives and Rash    Many years ago Has patient had a PCN reaction causing immediate rash, facial/tongue/throat swelling, SOB or lightheadedness with hypotension: No  Has patient had a PCN reaction causing severe rash involving mucus membranes or skin necrosis: No Has patient had a PCN reaction that required hospitalization: No Has patient had a PCN reaction occurring within the last 10 years: No If all of the above answers are "NO", then may proceed with Cephalosporin use.   . Sulfa Antibiotics Rash  . Sulfacetamide Sodium Rash    Medications: I have reviewed the patient's  current medications.  Results for orders placed or performed during the hospital encounter of 10/12/18 (from the past 48 hour(s))  Basic metabolic panel     Status: Abnormal   Collection Time: 10/13/18  5:48 AM  Result Value Ref Range   Sodium 130 (L) 135 - 145 mmol/L   Potassium 3.8 3.5 - 5.1 mmol/L   Chloride 100 98 - 111 mmol/L   CO2 24 22 - 32 mmol/L   Glucose, Bld 97 70 - 99 mg/dL   BUN 22 8 - 23 mg/dL   Creatinine, Ser 0.61 0.44 - 1.00 mg/dL   Calcium 8.1 (L) 8.9 - 10.3 mg/dL   GFR calc non Af Amer >60 >60 mL/min   GFR calc Af Amer >60 >60 mL/min    Comment: (NOTE) The eGFR has been calculated using the CKD EPI equation. This calculation has not been validated in all clinical situations. eGFR's persistently <60 mL/min signify possible Chronic Kidney Disease.    Anion gap 6 5 - 15    Comment: Performed at [facility] [address], [place], [place] [address]  CBC     Status: Abnormal   Collection Time: 10/13/18  5:48 AM  Result Value Ref Range   WBC 11.1 (H) 4.0 - 10.5 K/uL   RBC 3.87 3.87 - 5.11 MIL/uL   Hemoglobin 12.2 12.0 - 15.0 g/dL   HCT 37.0 36.0 - 46.0 %   MCV 95.6 80.0 - 100.0 fL   MCH 31.5 26.0 - 34.0 pg   MCHC 33.0 30.0 - 36.0 g/dL   RDW 15.8 (H) 11.5 - 15.5 %   Platelets 291 150 - 400 K/uL   nRBC 0.0 0.0 - 0.2 %    Comment: Performed at [facility], [place] [address] [place], [place] [address]    No results found.  Pertinent items noted in HPI and remainder of comprehensive ROS otherwise negative. Blood pressure (!) 172/76, pulse 73, temperature 99.4 F (37.4 C), temperature source Oral, resp. rate 17, SpO2 95 %.  patient is awake and alert.  She is oriented and appropriate.  Speech is fluent.  Judgment and insight are intact.  Cranial nerve function normal bilaterally.  Motor examination with some questionable weakness of left hip flexor strength otherwise motor strength is intact.  Sensory examination nonfocal.  Reflexes hypoactive but  symmetric.  Patient with some significant tenderness at her thoracic lumbar junction.  Assessment/Plan:   Patient has a very significant osteoporotic T12 fracture with retropulsed bone into the canal causing moderately severe stenosis.  Patient has obvious very severe osteoporosis and has had prior osteoporotic compression fractures evident on her most recent CT scan.  Given her advanced age and severe osteoporosis, she is a very high risk for surgical complications however the patient currently is not mobilizing and is quite debilitated.  I would like to get a CT scan of her thoracic spine to better evaluate for possible surgical options.  I have briefly discussed with her the possibility of undergoing posterior thoracic lumbar fusion with lower thoracic/ upper lumbar  decompressive laminectomy for treatment of her problem.  This would be a very extensive operation and once again would be very high risk for failure given her bone quality.  I will discuss the options further with the patient after reviewing her CT scan of her thoracic spine.  Laurie Galloway 10/13/2018, 2:06 PM

## 2018-10-13 NOTE — Progress Notes (Signed)
Progress Note    Laurie Galloway  YIF:[id number] DOB: 07-Feb-1930  DOA: 10/12/2018 PCP: Mayra Neer, MD    Brief Narrative:     Medical records reviewed and are as summarized below:  Laurie Galloway is an 84 y.o. female with history of hypertension has been experiencing worsening low back pain over the last 3 weeks.  Patient states she had a fall towards the end of last week of August 3 months ago.  Since then patient has been following with orthopedics and has got prednisone tapering dose recently.  Despite taking which patient's pain has been worsening.  Patient's pain acutely worsened 3 weeks ago with pain radiating from low back to her both thighs.  No incontinence of urine.  Patient has noticed increasingly difficulty walking.  Patient had some to the ER yesterday and had CT of the lumbar spine done which showed acute fracture of the T12 and transverse process of L1.  At that time ER physician I discussed with on-call neurosurgeon who advised T SLO brace and follow-up with Dr. Trenton Gammon at home patient has had previous surgery.  Despite which patient's pain has not improved and has come back to the ER today.  Assessment/Plan:   Principal Problem:   Compression fracture of body of thoracic vertebra (HCC) Active Problems:   HTN (hypertension)   Back pain   Low back pain secondary to acute fractures of T12 and transverse process of L1 vertebrae -returns to the hospital after failed outpatient pain control -appreciate Dr. Marchelle Folks consultation- ? Need for surgery -pain control with tylenol schedule, muscle relaxers and PRN oxycodone -bowel regimen -daughter would like to be involved with any discussion of surgery  Hypertension  -on verapamil and Cozaar  Mild hyponatremia  -stable, trend    Family Communication/Anticipated D/C date and plan/Code Status   DVT prophylaxis: scd Code Status: Full Code.  Family Communication: daughter Disposition Plan:    Medical Consultants:     Neurosurgery    Subjective:   Pain is still not controlled  Objective:    Vitals:   10/13/18 0044 10/13/18 0555 10/13/18 0950 10/13/18 1212  BP: (!) 180/71 (!) 183/66 (!) 158/78 (!) 172/76  Pulse: 81 72 78 73  Resp: 18 17  17   Temp: 98.7 F (37.1 C) 97.9 F (36.6 C)  99.4 F (37.4 C)  TempSrc: Oral Oral  Oral  SpO2: 97% 93% 98% 95%    Intake/Output Summary (Last 24 hours) at 10/13/2018 1430 Last data filed at 10/13/2018 0300 Gross per 24 hour  Intake 270 ml  Output -  Net 270 ml   There were no vitals filed for this visit.  Exam: In bed, uncomfortable appearing rrr No increase work of breathing Moves all 4 ext but lower ext is painful  Data Reviewed:   I have personally reviewed following labs and imaging studies:  Labs: Labs show the following:   Basic Metabolic Panel: Recent Labs  Lab 10/11/18 1122 10/13/18 0548  NA 130* 130*  K 4.4 3.8  CL 96* 100  CO2 27 24  GLUCOSE 106* 97  BUN 28* 22  CREATININE 0.52 0.61  CALCIUM 8.7* 8.1*   GFR Estimated Creatinine Clearance: 41.8 mL/min (by C-G formula based on SCr of 0.61 mg/dL). Liver Function Tests: No results for input(s): AST, ALT, ALKPHOS, BILITOT, PROT, ALBUMIN in the last 168 hours. No results for input(s): LIPASE, AMYLASE in the last 168 hours. No results for input(s): AMMONIA in the last 168 hours. Coagulation  profile Recent Labs  Lab 10/11/18 1122  INR 0.99    CBC: Recent Labs  Lab 10/11/18 1122 10/13/18 0548  WBC 11.8* 11.1*  HGB 13.5 12.2  HCT 41.9 37.0  MCV 99.5 95.6  PLT 330 291   Cardiac Enzymes: No results for input(s): CKTOTAL, CKMB, CKMBINDEX, TROPONINI in the last 168 hours. BNP (last 3 results) No results for input(s): PROBNP in the last 8760 hours. CBG: No results for input(s): GLUCAP in the last 168 hours. D-Dimer: No results for input(s): DDIMER in the last 72 hours. Hgb A1c: No results for input(s): HGBA1C in the last 72 hours. Lipid Profile: No  results for input(s): CHOL, HDL, LDLCALC, TRIG, CHOLHDL, LDLDIRECT in the last 72 hours. Thyroid function studies: No results for input(s): TSH, T4TOTAL, T3FREE, THYROIDAB in the last 72 hours.  Invalid input(s): FREET3 Anemia work up: No results for input(s): VITAMINB12, FOLATE, FERRITIN, TIBC, IRON, RETICCTPCT in the last 72 hours. Sepsis Labs: Recent Labs  Lab 10/11/18 1122 10/13/18 0548  WBC 11.8* 11.1*    Microbiology No results found for this or any previous visit (from the past 240 hour(s)).  Procedures and diagnostic studies:  No results found.  Medications:   . acetaminophen  1,000 mg Oral QID  . losartan  100 mg Oral q morning - 10a  . methocarbamol  500 mg Oral QID  . omega-3 acid ethyl esters  1 g Oral Daily  . senna  1 tablet Oral Daily  . verapamil  360 mg Oral q morning - 10a   Continuous Infusions:   LOS: 0 days   Geradine Girt  [facility]   *Please refer to [email or web address], password [email or web address] to get updated schedule on who will round on this patient, as hospitalists switch teams weekly. If 7PM-7AM, please contact night-coverage at [email or web address], password [email or web address] for any overnight needs.  10/13/2018, 2:30 PM

## 2018-10-13 NOTE — Clinical Social Work Note (Signed)
Clinical Social Work Assessment  Patient Details  Name: BRYLEI PEDLEY MRN: [id number] Date of Birth: 1930/08/18  Date of referral:  10/13/18               Reason for consult:  Facility Placement, Care Management Concerns, Discharge Planning                Permission sought to share information with:  Family Supports Permission granted to share information::  Yes, Verbal Permission Granted  Name::     Pryor Ochoa [phone]  Agency::     Relationship::  Daughter  Contact Information:  [phone]  Housing/Transportation Living arrangements for the past 2 months:  [place] of Information:  Patient, Adult Children Patient Interpreter Needed:  None Criminal Activity/Legal Involvement Pertinent to Current Situation/Hospitalization:  No - Comment as needed Significant Relationships:  Adult Children Lives with:  Adult Children Do you feel safe going back to the place where you live?  Yes Need for family participation in patient care:  Yes (Comment)  Care giving concerns:  Patient from home, where she cares for her disabled adult child. Patient awaiting recommendations from neurosurgery before agreeing to SNF.    Social Worker assessment / plan:  Social Work Ship broker and CSW met with patient at bedside. SW Student discussed role in discharge planning and physical therapy recommendations. Patient reports she lives at home with her adult child who has special needs, and is the primary care taker for her. She reports not having any issues completing ADLs/IADLs prior to her hospitalization. The daughter, Anne Ng reports the patient has required additional assistance the past three weeks. Patients daughter believes patient should go to SNF. Patient reports that she would like to wait until recommendations from neurosurgery before agreeing to SNF.   CSW to follow and support with discharge planning.   Employment status:  Retired Chemical engineer) PT Recommendations:  [place] / Referral to community resources:     Patient/Family's Response to care:  Patient and daughter appreciative of care.   Patient/Family's Understanding of and Emotional Response to Diagnosis, Current Treatment, and Prognosis:  Patient has a good understanding of her medical condition. Patient is undecided on if she is agreeable to SNF. Patient remains concerned with the care of her daughter.   Emotional Assessment Appearance:  Appears stated age Attitude/Demeanor/Rapport:  Engaged Affect (typically observed):  Appropriate, Pleasant, Hopeful Orientation:  Oriented to Self, Oriented to Situation, Oriented to Place, Oriented to  Time Alcohol / Substance use:  Never Used Psych involvement (Current and /or in the community):  No (Comment)  Discharge Needs  Concerns to be addressed:  Discharge Planning Concerns, Care Coordination Readmission within the last 30 days:  No Current discharge risk:  Dependent with Mobility Barriers to Discharge:  Continued Medical Work up   Arlis Porta, [place] Work 10/13/2018, 3:08 PM

## 2018-10-13 NOTE — Care Management Obs Status (Signed)
[place] NOTIFICATION   Patient Details  Name: Laurie Galloway MRN: [id number] Date of Birth: Feb 06, 1930   Medicare Observation Status Notification Given:  Yes  Midge Minium RN, BSN, NCM-BC, ACM-RN [phone]  10/13/2018, 4:17 PM

## 2018-10-14 DIAGNOSIS — Z791 Long term (current) use of non-steroidal anti-inflammatories (NSAID): Secondary | ICD-10-CM | POA: Diagnosis not present

## 2018-10-14 DIAGNOSIS — M4805 Spinal stenosis, thoracolumbar region: Secondary | ICD-10-CM | POA: Diagnosis not present

## 2018-10-14 DIAGNOSIS — G9589 Other specified diseases of spinal cord: Secondary | ICD-10-CM | POA: Diagnosis not present

## 2018-10-14 DIAGNOSIS — I959 Hypotension, unspecified: Secondary | ICD-10-CM | POA: Diagnosis not present

## 2018-10-14 DIAGNOSIS — Z981 Arthrodesis status: Secondary | ICD-10-CM | POA: Diagnosis not present

## 2018-10-14 DIAGNOSIS — M545 Low back pain: Secondary | ICD-10-CM | POA: Diagnosis not present

## 2018-10-14 DIAGNOSIS — S22081A Stable burst fracture of T11-T12 vertebra, initial encounter for closed fracture: Secondary | ICD-10-CM | POA: Diagnosis not present

## 2018-10-14 DIAGNOSIS — D62 Acute posthemorrhagic anemia: Secondary | ICD-10-CM | POA: Diagnosis not present

## 2018-10-14 DIAGNOSIS — G959 Disease of spinal cord, unspecified: Secondary | ICD-10-CM | POA: Diagnosis not present

## 2018-10-14 DIAGNOSIS — M4804 Spinal stenosis, thoracic region: Secondary | ICD-10-CM | POA: Diagnosis not present

## 2018-10-14 DIAGNOSIS — S22000A Wedge compression fracture of unspecified thoracic vertebra, initial encounter for closed fracture: Secondary | ICD-10-CM | POA: Diagnosis not present

## 2018-10-14 DIAGNOSIS — G8918 Other acute postprocedural pain: Secondary | ICD-10-CM | POA: Diagnosis not present

## 2018-10-14 DIAGNOSIS — S22080A Wedge compression fracture of T11-T12 vertebra, initial encounter for closed fracture: Secondary | ICD-10-CM | POA: Diagnosis not present

## 2018-10-14 DIAGNOSIS — E222 Syndrome of inappropriate secretion of antidiuretic hormone: Secondary | ICD-10-CM | POA: Diagnosis not present

## 2018-10-14 DIAGNOSIS — Z961 Presence of intraocular lens: Secondary | ICD-10-CM | POA: Diagnosis present

## 2018-10-14 DIAGNOSIS — S22080D Wedge compression fracture of T11-T12 vertebra, subsequent encounter for fracture with routine healing: Secondary | ICD-10-CM | POA: Diagnosis not present

## 2018-10-14 DIAGNOSIS — Z90710 Acquired absence of both cervix and uterus: Secondary | ICD-10-CM | POA: Diagnosis not present

## 2018-10-14 DIAGNOSIS — M2550 Pain in unspecified joint: Secondary | ICD-10-CM | POA: Diagnosis not present

## 2018-10-14 DIAGNOSIS — M8008XA Age-related osteoporosis with current pathological fracture, vertebra(e), initial encounter for fracture: Secondary | ICD-10-CM | POA: Diagnosis not present

## 2018-10-14 DIAGNOSIS — D72829 Elevated white blood cell count, unspecified: Secondary | ICD-10-CM | POA: Diagnosis not present

## 2018-10-14 DIAGNOSIS — E871 Hypo-osmolality and hyponatremia: Secondary | ICD-10-CM | POA: Diagnosis not present

## 2018-10-14 DIAGNOSIS — K5900 Constipation, unspecified: Secondary | ICD-10-CM | POA: Diagnosis present

## 2018-10-14 DIAGNOSIS — W19XXXD Unspecified fall, subsequent encounter: Secondary | ICD-10-CM | POA: Diagnosis not present

## 2018-10-14 DIAGNOSIS — G992 Myelopathy in diseases classified elsewhere: Secondary | ICD-10-CM | POA: Diagnosis not present

## 2018-10-14 DIAGNOSIS — G8929 Other chronic pain: Secondary | ICD-10-CM | POA: Diagnosis not present

## 2018-10-14 DIAGNOSIS — Z9842 Cataract extraction status, left eye: Secondary | ICD-10-CM | POA: Diagnosis not present

## 2018-10-14 DIAGNOSIS — I10 Essential (primary) hypertension: Secondary | ICD-10-CM | POA: Diagnosis not present

## 2018-10-14 DIAGNOSIS — M19042 Primary osteoarthritis, left hand: Secondary | ICD-10-CM | POA: Diagnosis not present

## 2018-10-14 DIAGNOSIS — Z882 Allergy status to sulfonamides status: Secondary | ICD-10-CM | POA: Diagnosis not present

## 2018-10-14 DIAGNOSIS — W1830XA Fall on same level, unspecified, initial encounter: Secondary | ICD-10-CM | POA: Diagnosis present

## 2018-10-14 DIAGNOSIS — Z66 Do not resuscitate: Secondary | ICD-10-CM | POA: Diagnosis present

## 2018-10-14 DIAGNOSIS — Z4789 Encounter for other orthopedic aftercare: Secondary | ICD-10-CM | POA: Diagnosis not present

## 2018-10-14 DIAGNOSIS — Z880 Allergy status to penicillin: Secondary | ICD-10-CM | POA: Diagnosis not present

## 2018-10-14 DIAGNOSIS — Z9841 Cataract extraction status, right eye: Secondary | ICD-10-CM | POA: Diagnosis not present

## 2018-10-14 DIAGNOSIS — S32019D Unspecified fracture of first lumbar vertebra, subsequent encounter for fracture with routine healing: Secondary | ICD-10-CM | POA: Diagnosis not present

## 2018-10-14 DIAGNOSIS — I700 Atherosclerosis of aorta: Secondary | ICD-10-CM | POA: Diagnosis not present

## 2018-10-14 DIAGNOSIS — M8448XA Pathological fracture, other site, initial encounter for fracture: Secondary | ICD-10-CM | POA: Diagnosis present

## 2018-10-14 DIAGNOSIS — Z7401 Bed confinement status: Secondary | ICD-10-CM | POA: Diagnosis not present

## 2018-10-14 DIAGNOSIS — M19041 Primary osteoarthritis, right hand: Secondary | ICD-10-CM | POA: Diagnosis not present

## 2018-10-14 DIAGNOSIS — M549 Dorsalgia, unspecified: Secondary | ICD-10-CM | POA: Diagnosis not present

## 2018-10-14 LAB — CBC
HCT: 35.7 % — ABNORMAL LOW (ref 36.0–46.0)
Hemoglobin: 12 g/dL (ref 12.0–15.0)
MCH: 32 pg (ref 26.0–34.0)
MCHC: 33.6 g/dL (ref 30.0–36.0)
MCV: 95.2 fL (ref 80.0–100.0)
Platelets: 266 10*3/uL (ref 150–400)
RBC: 3.75 MIL/uL — ABNORMAL LOW (ref 3.87–5.11)
RDW: 15.4 % (ref 11.5–15.5)
WBC: 10.7 10*3/uL — ABNORMAL HIGH (ref 4.0–10.5)
nRBC: 0 % (ref 0.0–0.2)

## 2018-10-14 LAB — BASIC METABOLIC PANEL
Anion gap: 5 (ref 5–15)
BUN: 19 mg/dL (ref 8–23)
CO2: 23 mmol/L (ref 22–32)
Calcium: 8.2 mg/dL — ABNORMAL LOW (ref 8.9–10.3)
Chloride: 101 mmol/L (ref 98–111)
Creatinine, Ser: 0.62 mg/dL (ref 0.44–1.00)
GFR calc Af Amer: >60 mL/min (ref >60)
GFR calc non Af Amer: >60 mL/min (ref >60)
Glucose, Bld: 99 mg/dL (ref 70–99)
Potassium: 4.2 mmol/L (ref 3.5–5.1)
Sodium: 129 mmol/L — ABNORMAL LOW (ref 135–145)

## 2018-10-14 NOTE — Progress Notes (Signed)
PROGRESS NOTE    Laurie Galloway  GGY:[id number] DOB: 1930-06-26 DOA: 10/12/2018 PCP: Mayra Neer, MD   Brief Narrative:  84 year old with history of essential hypertension came to the hospital with worsening of her lower back pain.  She had fall apparently several weeks ago and since then this continues to worsen.  In the ER she was noted to have acute fracture of her T12 and transverse process of L1.  Patient was seen by neurosurgery who recommended TLSO brace and outpatient follow-up but due to worsening of her symptoms and pain return back to the ER.  At this time despite given her risk factors and high risk for surgery, patient has opted for surgical intervention.   Assessment & Plan:   Principal Problem:   Compression fracture of body of thoracic vertebra (HCC) Active Problems:   HTN (hypertension)   Back pain  Acute fracture of T12 and transverse process of L1 Acute low back pain - Continue patient's pain control, muscle relaxer.  Bowel regimen PRN. - Neurosurgery is following the patient and plans for taking her to the OR for surgical intervention on Friday.  Patient is in the agreement and understanding that she is a high risk candidate given her advanced age and comorbidities.  She will also have difficult journey during the recovery process.  But given the extent of her pain and immobility, she is opted for surgery.  Essential hypertension -Continue verapamil and Cozaar  DVT prophylaxis: SCDs Code Status: DO NOT RESUSCITATE Family Communication: None at bedside Disposition Plan: Patient will require inpatient stay for at least next few days until surgical intervention which will be on Friday.  At this time she is in severe pain therefore requiring multiple doses of narcotics to keep this under control.  Due to  her immobility she also  requires significant assistance from the nursing staff.  Consultants:   Neurosurgery  Procedures:   None  Antimicrobials:    None   Subjective: Reports of low back pain especially with minimal movement.  Otherwise no other acute complaints.  Review of Systems Otherwise negative except as per HPI, including: General: Denies fever, chills, night sweats or unintended weight loss. Resp: Denies cough, wheezing, shortness of breath. Cardiac: Denies chest pain, palpitations, orthopnea, paroxysmal nocturnal dyspnea. GI: Denies abdominal pain, nausea, vomiting, diarrhea or constipation GU: Denies dysuria, frequency, hesitancy or incontinence MS: Significant lower back pain with minimal mobility. Neuro: Denies headache, neurologic deficits (focal weakness, numbness, tingling), abnormal gait Psych: Denies anxiety, depression, SI/HI/AVH Skin: Denies new rashes or lesions ID: Denies sick contacts, exotic exposures, travel  Objective: Vitals:   10/13/18 1925 10/14/18 0010 10/14/18 0515 10/14/18 1156  BP: (!) 153/68 (!) 179/88 (!) 187/98 (!) 153/75  Pulse: 80 80 78 80  Resp: 18 18 18 20   Temp: 97.8 F (36.6 C) 98.2 F (36.8 C) 98 F (36.7 C) 98.4 F (36.9 C)  TempSrc: Oral Oral Oral Oral  SpO2: 95% 96% 98% 93%    Intake/Output Summary (Last 24 hours) at 10/14/2018 1243 Last data filed at 10/13/2018 1900 Gross per 24 hour  Intake 118 ml  Output 700 ml  Net -582 ml   There were no vitals filed for this visit.  Examination:  General exam: Appears calm and comfortable, elderly frail-appearing Respiratory system: Clear to auscultation. Respiratory effort normal. Cardiovascular system: S1 & S2 heard, RRR. No JVD, murmurs, rubs, gallops or clicks. No pedal edema. Gastrointestinal system: Abdomen is nondistended, soft and nontender. No organomegaly or masses felt.  Normal bowel sounds heard. Central nervous system: Alert and oriented. No focal neurological deficits. Extremities: Symmetric 4 x 5 power.  Minimal range of motion especially of her lower extremity due to exacerbation of her back pain. Skin: No  rashes, lesions or ulcers Psychiatry: Judgement and insight appear normal. Mood & affect appropriate.     Data Reviewed:   CBC: Recent Labs  Lab 10/11/18 1122 10/13/18 0548 10/14/18 0329  WBC 11.8* 11.1* 10.7*  HGB 13.5 12.2 12.0  HCT 41.9 37.0 35.7*  MCV 99.5 95.6 95.2  PLT 330 291 017   Basic Metabolic Panel: Recent Labs  Lab 10/11/18 1122 10/13/18 0548 10/14/18 0329  NA 130* 130* 129*  K 4.4 3.8 4.2  CL 96* 100 101  CO2 27 24 23   GLUCOSE 106* 97 99  BUN 28* 22 19  CREATININE 0.52 0.61 0.62  CALCIUM 8.7* 8.1* 8.2*   GFR: Estimated Creatinine Clearance: 41.8 mL/min (by C-G formula based on SCr of 0.62 mg/dL). Liver Function Tests: No results for input(s): AST, ALT, ALKPHOS, BILITOT, PROT, ALBUMIN in the last 168 hours. No results for input(s): LIPASE, AMYLASE in the last 168 hours. No results for input(s): AMMONIA in the last 168 hours. Coagulation Profile: Recent Labs  Lab 10/11/18 1122  INR 0.99   Cardiac Enzymes: No results for input(s): CKTOTAL, CKMB, CKMBINDEX, TROPONINI in the last 168 hours. BNP (last 3 results) No results for input(s): PROBNP in the last 8760 hours. HbA1C: No results for input(s): HGBA1C in the last 72 hours. CBG: No results for input(s): GLUCAP in the last 168 hours. Lipid Profile: No results for input(s): CHOL, HDL, LDLCALC, TRIG, CHOLHDL, LDLDIRECT in the last 72 hours. Thyroid Function Tests: No results for input(s): TSH, T4TOTAL, FREET4, T3FREE, THYROIDAB in the last 72 hours. Anemia Panel: No results for input(s): VITAMINB12, FOLATE, FERRITIN, TIBC, IRON, RETICCTPCT in the last 72 hours. Sepsis Labs: No results for input(s): PROCALCITON, LATICACIDVEN in the last 168 hours.  No results found for this or any previous visit (from the past 240 hour(s)).       Radiology Studies: Ct Thoracic Spine Wo Contrast  Result Date: 10/13/2018 CLINICAL DATA:  Thoracic vertebral body fracture osteoporosis. EXAM: CT THORACIC SPINE  WITHOUT CONTRAST TECHNIQUE: Multidetector CT images of the thoracic were obtained using the standard protocol without intravenous contrast. COMPARISON:  Same day lumbar spine CT FINDINGS: Alignment: Maintained Vertebrae: 3 column fracture of T12 with 90% height loss and 10 mm of retropulsion contributing to at least 50% luminal narrowing of the central canal. Osteopenic appearance of the remainder of the thoracic vertebrae with striated vertical trabecular markings. No aggressive osseous lesions or additional fractures involving thoracic spine. Acute nondisplaced left L1 transverse process fracture, series 5/56. Healed medial right eleventh rib fracture with callus, series 5/127. Paraspinal and other soft tissues: Atherosclerosis of the aortic arch and descending thoracic aorta without aneurysm. Coarse rounded 12 mm calcification at the base of the neck on the right, nonspecific possibly vascular in etiology or associated with the right thyroid. No paraspinal soft tissue mass or hemorrhage. No adenopathy. Hypodense right adrenal nodule consistent with an adenoma. Mild peribronchial thickening and perihilar as well as bibasilar atelectasis. Disc levels: No significant disc protrusions of the thoracic spine. Cervical spondylosis with multilevel degenerative disc disease from the included C5 inferior endplate through T1. IMPRESSION: 1. 3 column fracture of T12 with 90% height loss and 10 mm of retropulsion contributing to at least 50% luminal narrowing of the central canal. 2.  Acute nondisplaced left L1 transverse process fracture. 3. Healed medial right eleventh rib fracture with callus. 4. Right adrenal adenoma. 5. Degenerative disc disease of the included lower cervical spine from C5 through T1. 6. Osteopenic appearance of the thoracic spine with accentuated vertical trabecular markings noted. Aortic Atherosclerosis (ICD10-I70.0). Electronically Signed   By: Ashley Royalty M.D.   On: 10/13/2018 21:57         Scheduled Meds: . acetaminophen  1,000 mg Oral QID  . losartan  100 mg Oral q morning - 10a  . methocarbamol  500 mg Oral QID  . omega-3 acid ethyl esters  1 g Oral Daily  . senna  1 tablet Oral Daily  . verapamil  360 mg Oral q morning - 10a   Continuous Infusions:   LOS: 0 days   Time spent= 40 mins    Chryl Holten Arsenio Loader, MD [facility] Pager [phone]   If 7PM-7AM, please contact night-coverage [email or web address] Password [facility] 10/14/2018, 12:43 PM

## 2018-10-14 NOTE — Plan of Care (Signed)
Pt verbalized understanding plan of care. Pt alert and oriented x4. Skin warm and dry. Respirations equal and unlabored. Pt continuously in constant pain. Pt states scheduled meds help some of the pain, however pt experiences break though pain in lower back and legs. Pt remains in bed. Requires extensive assistance to get out of bed. Pt's ability to manage health-related needs after discharge seems low. Pt at risk for impaired skin integrity. Sacral foam placed on patient. Nurse recommendation to have patient transferred if plan is to transfer patient to SNF and increase length of stay. Provider paged about recommendation.

## 2018-10-14 NOTE — Progress Notes (Signed)
Pt arrived to room 5N11 via bed from [facility]. Received report from [place], [place] in [facility]. See assessment. Will continue to monitor.

## 2018-10-14 NOTE — Progress Notes (Signed)
OT Cancellation Note  Patient Details Name: KARL KNARR MRN: [id number] DOB: [birth year]   Cancelled Treatment:    Reason Eval/Treat Not Completed: Other (comment) Note CT scan completed for possible surgery options. Will wait for MD note and clarification on plan of care and activity clarification.  Pauline Aus OTR/L Acute Rehabilitation [phone] office number    10/14/2018, 9:01 AM

## 2018-10-14 NOTE — Progress Notes (Signed)
Patient still with intense pain with any movement.  No new weakness or sensory loss.  I reviewed the patient's CT scan of her thoracic spine.  This demonstrates normal thoracic spinal anatomy aside from her severe T12 fracture.  I have discussed situation with the patient and her daughter.  They both do not feel that she is able to tolerate bracing   Or bed rest alone for treatment of her problem. I have discussed the possibility of posterior decompressive surgery with subsequent posterior lateral fusion from T10-L3 with segmental pedicle screw fixation augmented with methylmethacrylate.  I would also perform a T9 vertebroplasty with methylmethacrylate to give her adjacent level additional strength and hopefully lessen the chance of proximal junctional kyphotic fracture above the level of her fusion.  I have discussed the risks involved with surgery including but not limited to the risk of anesthesia, bleeding, infection, CSF leak, nerve root injury, spinal cord injury, fusion failure, instrumentation failure, continued pain, and non benefit.  I have also discussed the risks secondary to her prolonged immobility and her advanced age.  The patient and her daughter understand that she is high risk with surgery but wish to proceed.  Plan for surgery Friday afternoon.

## 2018-10-15 LAB — SODIUM, URINE, RANDOM: Sodium, Ur: 96 mmol/L

## 2018-10-15 LAB — BASIC METABOLIC PANEL
Anion gap: 7 (ref 5–15)
BUN: 11 mg/dL (ref 8–23)
CO2: 23 mmol/L (ref 22–32)
Calcium: 7.8 mg/dL — ABNORMAL LOW (ref 8.9–10.3)
Chloride: 94 mmol/L — ABNORMAL LOW (ref 98–111)
Creatinine, Ser: 0.56 mg/dL (ref 0.44–1.00)
GFR calc Af Amer: >60 mL/min (ref >60)
GFR calc non Af Amer: >60 mL/min (ref >60)
Glucose, Bld: 136 mg/dL — ABNORMAL HIGH (ref 70–99)
Potassium: 3.8 mmol/L (ref 3.5–5.1)
Sodium: 124 mmol/L — ABNORMAL LOW (ref 135–145)

## 2018-10-15 LAB — TYPE AND SCREEN
ABO/RH(D): O POS
Antibody Screen: NEGATIVE

## 2018-10-15 LAB — SURGICAL PCR SCREEN
MRSA, PCR: NEGATIVE
Staphylococcus aureus: NEGATIVE

## 2018-10-15 LAB — OSMOLALITY, URINE: Osmolality, Ur: 598 mOsm/kg (ref 300–900)

## 2018-10-15 MED ORDER — SODIUM CHLORIDE 1 G PO TABS
1.0000 g | ORAL_TABLET | Freq: Two times a day (BID) | ORAL | Status: DC
  Administered 2018-10-15 – 2018-10-16 (×2): 1 g via ORAL
  Filled 2018-10-15 (×3): qty 1

## 2018-10-15 MED ORDER — HYDRALAZINE HCL 25 MG PO TABS
25.0000 mg | ORAL_TABLET | Freq: Four times a day (QID) | ORAL | Status: DC | PRN
  Administered 2018-10-17: 25 mg via ORAL
  Filled 2018-10-15: qty 1

## 2018-10-15 MED ORDER — CHLORHEXIDINE GLUCONATE CLOTH 2 % EX PADS
6.0000 | MEDICATED_PAD | Freq: Once | CUTANEOUS | Status: AC
  Administered 2018-10-16: 6 via TOPICAL

## 2018-10-15 MED ORDER — CHLORHEXIDINE GLUCONATE CLOTH 2 % EX PADS
6.0000 | MEDICATED_PAD | Freq: Once | CUTANEOUS | Status: AC
  Administered 2018-10-15: 6 via TOPICAL

## 2018-10-15 MED ORDER — SODIUM CHLORIDE 0.9 % IV SOLN
INTRAVENOUS | Status: DC
  Administered 2018-10-15: 09:00:00 via INTRAVENOUS

## 2018-10-15 NOTE — Progress Notes (Signed)
CSW met with patient at bedside and discussed disposition plan. Patient now agreeable to fax out referrals to SNF. Patient prefers [facility]. CSW sent out referrals. Noted plan for patient to have surgery tomorrow. CSW to follow and support with discharge planning.  Estanislado Emms, [place]

## 2018-10-15 NOTE — Plan of Care (Signed)

## 2018-10-15 NOTE — Evaluation (Signed)
Occupational Therapy Evaluation Patient Details Name: Laurie Galloway MRN: [id number] DOB: 03-01-1930 Today's Date: 10/15/2018    History of Present Illness Pt is an 84 y/o female admitted secondary to worsening back pain. Pt with recent trip to ED and was found to have T12 compression fracture, and transverse process fracture at L1. Imaging also revelaed old compression fractures at L2-4. PMH includes HTN and lumbar surgery.    Clinical Impression   PTA pt been requiring increased assist from daughter for ADL and DME for mobility. Before initial fall mod I for ADL and mobility with Rollator. Pt is currently mod A +2 for bed mobility and max A +2 for BSC transfer. Pt was able to complete peri care at bed level with set up (front) max A in standing. Pt very limited by pain and decreased balance/activity tolerance. Only able to recall 1/3 precautions when therapy entered the room. Educated on back precautions and also on brace - how to don/doff as Pt was unable to recall. Pt will require skilled OT in the acute setting as well as afterwards at the SNF level to maximize safety and independence in ADL and functional transfers.     Follow Up Recommendations  SNF;Supervision/Assistance - 24 hour    Equipment Recommendations  Other (comment)(defer to next venue)    Recommendations for Other Services       Precautions / Restrictions Precautions Precautions: Back;Fall Precaution Booklet Issued: No Precaution Comments: Reviewed back precautions with pt.  Required Braces or Orthoses: Spinal Brace Spinal Brace: Thoracolumbosacral orthotic Restrictions Weight Bearing Restrictions: No      Mobility Bed Mobility Overal bed mobility: Needs Assistance Bed Mobility: Supine to Sit     Supine to sit: Mod assist;+2 for physical assistance     General bed mobility comments: pateint already sitting up in bed with HOB elevated; use of bed pad to swivel hips; discouraged patient from rotating at trunk    Transfers Overall transfer level: Needs assistance Equipment used: Rolling walker (2 wheeled) Transfers: Sit to/from [facility] Sit to Stand: Max assist;+2 physical assistance Stand pivot transfers: Max assist;+2 physical assistance       General transfer comment: Max A +2 to power up at bedside; Mod-Max A to steady patient in upright standing to perform pericare. MAx A +2 for stand pivot to recliner with poor ability to move LE to assist    Balance Overall balance assessment: Needs assistance Sitting-balance support: Bilateral upper extremity supported;Feet supported Sitting balance-Leahy Scale: Fair     Standing balance support: Bilateral upper extremity supported;During functional activity Standing balance-Leahy Scale: Poor Standing balance comment: Heavy reliance on UE and external support                            ADL either performed or assessed with clinical judgement   ADL Overall ADL's : Needs assistance/impaired Eating/Feeding: Set up;Sitting   Grooming: Set up;Sitting Grooming Details (indicate cue type and reason): unable to tolerate standing at this time Upper Body Bathing: Moderate assistance;Sitting   Lower Body Bathing: Total assistance;Sitting/lateral leans   Upper Body Dressing : Maximal assistance;Sitting Upper Body Dressing Details (indicate cue type and reason): to don brace - education in tightening and strap placement Lower Body Dressing: Maximal assistance Lower Body Dressing Details (indicate cue type and reason): unable to complete figure 4 Toilet Transfer: Maximal assistance;+2 for physical assistance;+2 for safety/equipment;Stand-pivot;BSC;RW Toilet Transfer Details (indicate cue type and reason): significant forward flexion -  unable to stand upright Toileting- Clothing Manipulation and Hygiene: Maximal assistance;Set up;Bed level;Sit to/from stand Toileting - Clothing Manipulation Details (indicate cue type and  reason): able to complete front peri care bed level, max A for sit <>stand     Functional mobility during ADLs: Maximal assistance;+2 for physical assistance;+2 for safety/equipment;Rolling walker General ADL Comments: decreased activity tolerance and ability to access LB due to back precautions     Vision         Perception     Praxis      Pertinent Vitals/Pain Pain Assessment: Faces Faces Pain Scale: Hurts whole lot Pain Location: back and BLEs  Pain Descriptors / Indicators: Shooting;Sharp Pain Intervention(s): Limited activity within patient's tolerance;Monitored during session;Repositioned;Other (comment)(brace in place)     Hand Dominance     Extremity/Trunk Assessment Upper Extremity Assessment Upper Extremity Assessment: Overall WFL for tasks assessed;Generalized weakness   Lower Extremity Assessment Lower Extremity Assessment: Defer to PT evaluation   Cervical / Trunk Assessment Cervical / Trunk Assessment: Kyphotic;Other exceptions Cervical / Trunk Exceptions: compression fractures    Communication Communication Communication: No difficulties   Cognition Arousal/Alertness: Awake/alert Behavior During Therapy: WFL for tasks assessed/performed Overall Cognitive Status: Within Functional Limits for tasks assessed                                     General Comments  educated on back precations and how to properly apply brace when in sitting    Exercises     Shoulder Instructions      Home Living Family/patient expects to be discharged to:: Private residence Living Arrangements: Children Available Help at Discharge: Family;Available PRN/intermittently Type of Home: House Home Access: Ramped entrance     Home Layout: One level     Bathroom Shower/Tub: Teacher, early years/pre: Handicapped height     Home Equipment: Tub bench;Walker - 4 wheels;Bedside commode;Wheelchair - manual   Additional Comments: Pt's special needs  daughter lives with pt, however, unable to physically assist.       Prior Functioning/Environment Level of Independence: Needs assistance  Gait / Transfers Assistance Needed: Since having increased pain, has had difficulty ambulating and has required assist with use of walker. Prior to the last 3 weeks, pt was using rollator for ambulation.  ADL's / Homemaking Assistance Needed: Pt's daughter has been helping with ADL tasks.             OT Problem List: Decreased range of motion;Decreased activity tolerance;Impaired balance (sitting and/or standing);Decreased knowledge of use of DME or AE;Decreased knowledge of precautions;Pain      OT Treatment/Interventions: Self-care/ADL training;Therapeutic exercise;DME and/or AE instruction;Therapeutic activities;Patient/family education;Balance training    OT Goals(Current goals can be found in the care plan section) Acute Rehab OT Goals Patient Stated Goal: to decrease pain OT Goal Formulation: With patient Time For Goal Achievement: 10/29/18 Potential to Achieve Goals: Good ADL Goals Pt Will Perform Lower Body Bathing: with set-up;sitting/lateral leans;with adaptive equipment Pt Will Perform Lower Body Dressing: with mod assist;with adaptive equipment;sit to/from stand Pt Will Transfer to Toilet: with min assist;stand pivot transfer;bedside commode Pt Will Perform Toileting - Clothing Manipulation and hygiene: with min assist;sitting/lateral leans Additional ADL Goal #1: Pt will perform bed mobility at supervision level, maintaining back precautions prior to engaging in ADL activity  OT Frequency: Min 2X/week   Barriers to D/C:  Co-evaluation PT/OT/SLP Co-Evaluation/Treatment: Yes Reason for Co-Treatment: For patient/therapist safety;To address functional/ADL transfers;Other (comment)(activity tolerance) PT goals addressed during session: Mobility/safety with mobility;Balance;Proper use of DME;Strengthening/ROM OT goals  addressed during session: ADL's and self-care;Strengthening/ROM;Proper use of Adaptive equipment and DME      AM-PAC PT "6 Clicks" Daily Activity     Outcome Measure Help from another person eating meals?: None Help from another person taking care of personal grooming?: None(in sitting) Help from another person toileting, which includes using toliet, bedpan, or urinal?: A Lot Help from another person bathing (including washing, rinsing, drying)?: A Lot Help from another person to put on and taking off regular upper body clothing?: A Lot Help from another person to put on and taking off regular lower body clothing?: Total 6 Click Score: 15   End of Session Equipment Utilized During Treatment: Gait belt;Rolling walker;Back brace Nurse Communication: Mobility status;Need for lift equipment;Precautions(use STEDY, no purewick in place)  Activity Tolerance: Patient limited by pain Patient left: in chair;with call bell/phone within reach  OT Visit Diagnosis: Unsteadiness on feet (R26.81);Other abnormalities of gait and mobility (R26.89);Muscle weakness (generalized) (M62.81);History of falling (Z91.81);Pain Pain - Right/Left: (bilateral) Pain - part of body: Leg(back)                Time: 5038-8828 OT Time Calculation (min): 32 min Charges:  OT General Charges $OT Visit: 1 Visit OT Evaluation $OT Eval Moderate Complexity: [place] OTR/L Acute Rehabilitation Services Pager: [phone] Office: [place] 10/15/2018, 2:35 PM

## 2018-10-15 NOTE — Progress Notes (Signed)
Physical Therapy Treatment Patient Details Name: Laurie Galloway MRN: [id number] DOB: Jun 22, 1930 Today's Date: 10/15/2018    History of Present Illness Pt is an 84 y/o female admitted secondary to worsening back pain. Pt with recent trip to ED and was found to have T12 compression fracture, and transverse process fracture at L1. Imaging also revelaed old compression fractures at L2-4. PMH includes HTN and lumbar surgery.     PT Comments    Patient sitting up in bed upon PT/OT arrival - in slumped position. Education on importance of OOB mobility and upright posturing as tolerated. Patient requiring Mod-Max A +2 for bed level mobility and transfers with pain limiting. Review of back precautions in session as well as how to properly apply brace. Patient with limited ability to shuffle/move feet with stand pivot transfer with education to nursing staff on likely need for STEDY for patient comfort. Will continue to follow.     Follow Up Recommendations  SNF;Supervision/Assistance - 24 hour     Equipment Recommendations  None recommended by PT    Recommendations for Other Services OT consult     Precautions / Restrictions Precautions Precautions: Back;Fall Precaution Booklet Issued: No Precaution Comments: Reviewed back precautions with pt.  Required Braces or Orthoses: Spinal Brace Spinal Brace: Thoracolumbosacral orthotic Restrictions Weight Bearing Restrictions: No    Mobility  Bed Mobility Overal bed mobility: Needs Assistance Bed Mobility: Supine to Sit     Supine to sit: Mod assist;+2 for physical assistance     General bed mobility comments: pateint already sitting up in bed with HOB elevated; use of bed pad to swivel hips; discouraged patient from rotating at trunk   Transfers Overall transfer level: Needs assistance Equipment used: Rolling walker (2 wheeled) Transfers: Sit to/from [facility] Sit to Stand: Max assist;+2 physical assistance Stand  pivot transfers: Max assist;+2 physical assistance       General transfer comment: Max A +2 to power up at bedside; Mod-Max A to steady patient in upright standing to perform pericare. MAx A +2 for stand pivot to recliner with poor ability to move LE to assist  Ambulation/Gait             General Gait Details: deferred   Stairs             Wheelchair Mobility    Modified Rankin (Stroke Patients Only)       Balance Overall balance assessment: Needs assistance Sitting-balance support: Bilateral upper extremity supported;Feet supported Sitting balance-Leahy Scale: Fair     Standing balance support: Bilateral upper extremity supported;During functional activity Standing balance-Leahy Scale: Poor Standing balance comment: Heavy reliance on UE and external support                             Cognition Arousal/Alertness: Awake/alert Behavior During Therapy: WFL for tasks assessed/performed Overall Cognitive Status: Within Functional Limits for tasks assessed                                        Exercises      General Comments General comments (skin integrity, edema, etc.): educated on back precations and how to properly apply brace when in sitting      Pertinent Vitals/Pain Pain Assessment: Faces Faces Pain Scale: Hurts whole lot Pain Location: back and BLEs  Pain Descriptors / Indicators: Shooting;Sharp Pain Intervention(s): Limited  activity within patient's tolerance;Monitored during session;Repositioned    Home Living                      Prior Function            PT Goals (current goals can now be found in the care plan section) Acute Rehab PT Goals Patient Stated Goal: to decrease pain PT Goal Formulation: With patient Time For Goal Achievement: 10/27/18 Potential to Achieve Goals: Fair Progress towards PT goals: Not progressing toward goals - comment(pain limiting)    Frequency    Min 2X/week       PT Plan Current plan remains appropriate    Co-evaluation              AM-PAC PT "6 Clicks" Daily Activity  Outcome Measure  Difficulty turning over in bed (including adjusting bedclothes, sheets and blankets)?: Unable Difficulty moving from lying on back to sitting on the side of the bed? : Unable Difficulty sitting down on and standing up from a chair with arms (e.g., wheelchair, bedside commode, etc,.)?: Unable Help needed moving to and from a bed to chair (including a wheelchair)?: A Lot Help needed walking in hospital room?: Total Help needed climbing 3-5 steps with a railing? : Total 6 Click Score: 7    End of Session Equipment Utilized During Treatment: Gait belt Activity Tolerance: Patient limited by pain Patient left: in chair;with call bell/phone within reach Nurse Communication: Mobility status PT Visit Diagnosis: Unsteadiness on feet (R26.81);Repeated falls (R29.6);Muscle weakness (generalized) (M62.81);History of falling (Z91.81);Pain Pain - part of body: Leg(back, B LE)     Time: 1610-9604 PT Time Calculation (min) (ACUTE ONLY): 33 min  Charges:  $Therapeutic Activity: 8-22 mins                    Lanney Gins, PT, DPT Supplemental Physical Therapist 10/15/18 11:43 AM Pager: [phone] Office: [phone]

## 2018-10-15 NOTE — Progress Notes (Signed)
PROGRESS NOTE    Laurie Galloway  KZS:[id number] DOB: June 26, 1930 DOA: 10/12/2018 PCP: Mayra Neer, MD   Brief Narrative: 84 year old female with hypertension, who had a fall several weeks ago and having lower back pain since then, that worsenED, seen in the ER where found to have acute fracture of her right T12 transverse process of L1..  Patient was seen by neurosurgery who recommended T SLO brace and outpatient follow-up due to worsening of her symptoms.  However patient continues to have pain and has elected to undergo intervention being high risk.  Assessment & Plan:   Acute fracture of her right T12 transverse process of L1 with acute low back pain, not controlled with medications and TSLO: Seen by surgery, at this point patient has opted for surgical intervention on the on high risk for operative intervention due to her advanced age and comorbidities.  Continue pain control with opiates, muscle relaxants.  Awaiting for surgery on Friday.  HTN (hypertension): Pressure is borderline controlled likely from pain.  Continue losartan, verapamil, add PRN hydralazine  Hyponatremia: Unclear etiology.will check urine electrolytes and osmol, start on gentle normal saline, salt tab and watch for BP.Patient not on diuretics. Liberalize diet. Repeat BMP in the morning.  DVT prophylaxis: SCD Code Status: DNR Family Communication: No family at the bedside. Disposition Plan: on hold.  Pending surgical intervention on Friday   Consultants:  Neurosurgery Procedures: none Subjective: Complains of pain with movement 8 out of 10 shooting pain on the back and bilateral legs.  No nausea, vomiting, chest pain or shortness of breath.  Objective: Vitals:   10/15/18 0037 10/15/18 0444 10/15/18 0445 10/15/18 0759  BP: (!) 159/80 (!) 194/80 (!) 191/84 (!) 176/82  Pulse: 74 80 73 73  Resp: 20 20  16   Temp: 98.3 F (36.8 C) 97.6 F (36.4 C)  97.7 F (36.5 C)  TempSrc: Oral Oral  Oral  SpO2: 96% 96%   98%    Intake/Output Summary (Last 24 hours) at 10/15/2018 1201 Last data filed at 10/15/2018 0900 Gross per 24 hour  Intake 480 ml  Output 1050 ml  Net -570 ml   There were no vitals filed for this visit. Weight change:   There is no height or weight on file to calculate BMI.  Examination:  General exam: Calm, in moderate pain not in acute distress HEENT:Oral mucosa moist, Ear/Nose WNL grossly Respiratory system: Bilateral equal air entry, no crackles and wheezing Cardiovascular system: S1 & S2 heard, No JVD/murmurs.No pedal edema. Gastrointestinal system: Abdomen soft, nontender non-distended, BS +. Nervous System:Alert/awake/oriented at baseline.  Able to move UE and LE Extremities: No edema,distal peripheral pulses palpable. Skin: No rashes,no icterus. MSK: Normal muscle bulk,tone ,power  Data Reviewed: I have personally reviewed following labs and imaging studies  CBC: Recent Labs  Lab 10/11/18 1122 10/13/18 0548 10/14/18 0329  WBC 11.8* 11.1* 10.7*  HGB 13.5 12.2 12.0  HCT 41.9 37.0 35.7*  MCV 99.5 95.6 95.2  PLT 330 291 732   Basic Metabolic Panel: Recent Labs  Lab 10/11/18 1122 10/13/18 0548 10/14/18 0329 10/15/18 0928  NA 130* 130* 129* 124*  K 4.4 3.8 4.2 3.8  CL 96* 100 101 94*  CO2 27 24 23 23   GLUCOSE 106* 97 99 136*  BUN 28* 22 19 11   CREATININE 0.52 0.61 0.62 0.56  CALCIUM 8.7* 8.1* 8.2* 7.8*   GFR: Estimated Creatinine Clearance: 41.8 mL/min (by C-G formula based on SCr of 0.56 mg/dL). Liver Function Tests: No results  for input(s): AST, ALT, ALKPHOS, BILITOT, PROT, ALBUMIN in the last 168 hours. No results for input(s): LIPASE, AMYLASE in the last 168 hours. No results for input(s): AMMONIA in the last 168 hours. Coagulation Profile: Recent Labs  Lab 10/11/18 1122  INR 0.99   Cardiac Enzymes: No results for input(s): CKTOTAL, CKMB, CKMBINDEX, TROPONINI in the last 168 hours. BNP (last 3 results) No results for input(s): PROBNP in  the last 8760 hours. HbA1C: No results for input(s): HGBA1C in the last 72 hours. CBG: No results for input(s): GLUCAP in the last 168 hours. Lipid Profile: No results for input(s): CHOL, HDL, LDLCALC, TRIG, CHOLHDL, LDLDIRECT in the last 72 hours. Thyroid Function Tests: No results for input(s): TSH, T4TOTAL, FREET4, T3FREE, THYROIDAB in the last 72 hours. Anemia Panel: No results for input(s): VITAMINB12, FOLATE, FERRITIN, TIBC, IRON, RETICCTPCT in the last 72 hours. Sepsis Labs: No results for input(s): PROCALCITON, LATICACIDVEN in the last 168 hours.  No results found for this or any previous visit (from the past 240 hour(s)).    Radiology Studies: Ct Thoracic Spine Wo Contrast  Result Date: 10/13/2018 CLINICAL DATA:  Thoracic vertebral body fracture osteoporosis. EXAM: CT THORACIC SPINE WITHOUT CONTRAST TECHNIQUE: Multidetector CT images of the thoracic were obtained using the standard protocol without intravenous contrast. COMPARISON:  Same day lumbar spine CT FINDINGS: Alignment: Maintained Vertebrae: 3 column fracture of T12 with 90% height loss and 10 mm of retropulsion contributing to at least 50% luminal narrowing of the central canal. Osteopenic appearance of the remainder of the thoracic vertebrae with striated vertical trabecular markings. No aggressive osseous lesions or additional fractures involving thoracic spine. Acute nondisplaced left L1 transverse process fracture, series 5/56. Healed medial right eleventh rib fracture with callus, series 5/127. Paraspinal and other soft tissues: Atherosclerosis of the aortic arch and descending thoracic aorta without aneurysm. Coarse rounded 12 mm calcification at the base of the neck on the right, nonspecific possibly vascular in etiology or associated with the right thyroid. No paraspinal soft tissue mass or hemorrhage. No adenopathy. Hypodense right adrenal nodule consistent with an adenoma. Mild peribronchial thickening and perihilar  as well as bibasilar atelectasis. Disc levels: No significant disc protrusions of the thoracic spine. Cervical spondylosis with multilevel degenerative disc disease from the included C5 inferior endplate through T1. IMPRESSION: 1. 3 column fracture of T12 with 90% height loss and 10 mm of retropulsion contributing to at least 50% luminal narrowing of the central canal. 2. Acute nondisplaced left L1 transverse process fracture. 3. Healed medial right eleventh rib fracture with callus. 4. Right adrenal adenoma. 5. Degenerative disc disease of the included lower cervical spine from C5 through T1. 6. Osteopenic appearance of the thoracic spine with accentuated vertical trabecular markings noted. Aortic Atherosclerosis (ICD10-I70.0). Electronically Signed   By: Ashley Royalty M.D.   On: 10/13/2018 21:57        Scheduled Meds: . acetaminophen  1,000 mg Oral QID  . Chlorhexidine Gluconate Cloth  6 each Topical Once   And  . Chlorhexidine Gluconate Cloth  6 each Topical Once  . losartan  100 mg Oral q morning - 10a  . methocarbamol  500 mg Oral QID  . omega-3 acid ethyl esters  1 g Oral Daily  . senna  1 tablet Oral Daily  . verapamil  360 mg Oral q morning - 10a   Continuous Infusions: . sodium chloride 75 mL/hr at 10/15/18 0907     LOS: 1 day    Time spent: More  than 50% of that time was spent in counseling and/or coordination of care.      Antonieta Pert, MD [facility] Pager [phone]  If 7PM-7AM, please contact night-coverage [email or web address] Password [email or web address] 10/15/2018, 12:01 PM

## 2018-10-16 ENCOUNTER — Encounter (HOSPITAL_COMMUNITY)
Admission: EM | Disposition: A | Discharge status: Skilled Nursing Facility | Payer: Self-pay | Source: Home / Self Care | Attending: Internal Medicine

## 2018-10-16 ENCOUNTER — Encounter (HOSPITAL_COMMUNITY): Payer: Self-pay | Admitting: Anesthesiology

## 2018-10-16 LAB — BASIC METABOLIC PANEL
Anion gap: 6 (ref 5–15)
BUN: 12 mg/dL (ref 8–23)
CO2: 22 mmol/L (ref 22–32)
Calcium: 7.8 mg/dL — ABNORMAL LOW (ref 8.9–10.3)
Chloride: 97 mmol/L — ABNORMAL LOW (ref 98–111)
Creatinine, Ser: 0.67 mg/dL (ref 0.44–1.00)
GFR calc Af Amer: >60 mL/min (ref >60)
GFR calc non Af Amer: >60 mL/min (ref >60)
Glucose, Bld: 104 mg/dL — ABNORMAL HIGH (ref 70–99)
Potassium: 4.3 mmol/L (ref 3.5–5.1)
Sodium: 125 mmol/L — ABNORMAL LOW (ref 135–145)

## 2018-10-16 SURGERY — LAMINECTOMY WITH POSTERIOR LATERAL ARTHRODESIS LEVEL 4
Anesthesia: General | Site: Back

## 2018-10-16 MED ORDER — HYDRALAZINE HCL 25 MG PO TABS
25.0000 mg | ORAL_TABLET | Freq: Three times a day (TID) | ORAL | Status: DC
  Administered 2018-10-16 – 2018-10-18 (×7): 25 mg via ORAL
  Filled 2018-10-16 (×7): qty 1

## 2018-10-16 MED ORDER — SODIUM CHLORIDE 0.9 % IV SOLN
INTRAVENOUS | Status: DC
  Administered 2018-10-16 – 2018-10-18 (×4): via INTRAVENOUS

## 2018-10-16 MED ORDER — SODIUM CHLORIDE 1 G PO TABS
1.0000 g | ORAL_TABLET | Freq: Three times a day (TID) | ORAL | Status: DC
  Administered 2018-10-16 – 2018-10-22 (×15): 1 g via ORAL
  Filled 2018-10-16 (×19): qty 1

## 2018-10-16 MED ORDER — CLONIDINE HCL 0.1 MG PO TABS: 0.1000 mg | ORAL_TABLET | Freq: Two times a day (BID) | ORAL | Status: AC | PRN

## 2018-10-16 NOTE — Plan of Care (Signed)

## 2018-10-16 NOTE — Progress Notes (Signed)
PROGRESS NOTE    Laurie Galloway  [id number]:[id number] DOB: 1930-07-02 DOA: 10/12/2018 PCP: Mayra Neer, MD   Brief Narrative: 84 year old female with hypertension, who had a fall several weeks ago and having lower back pain since then, that worsenED, seen in the ER where found to have acute fracture of her right T12 transverse process of L1..  Patient was seen by neurosurgery who recommended T SLO brace and outpatient follow-up due to worsening of her symptoms.  However patient continues to have pain and has elected to undergo intervention being high risk. Surgery is being held due to her hyponatremia.  Assessment & Plan:   Acute fracture of her right T12 transverse process of L1 with acute low back pain: pain  not well controlled with medications and TSLO: Seen by nuerosurgery, at this point patient has opted for surgical intervention despite being on high risk for operative intervention due to her advanced age and comorbidities.  Continue pain control with opiates, muscle relaxants.  Surgery is being held due to hyponatremia  Hyponatremia: Urine work shows urine osmole 598, urine sodium 96, sodium slightly improved to 125 from 124 with normal saline and salt tablet.  Suspect SIADH due to pain. Liberalize diet.  DC normal saline, start fluid restriction 1200 ml/24 hr, increase salt tab 1 g TID, repeat BMP in am . Check tsh, serum uric acid in am. If sodium does not improve, consider nephrology evaluation.  Accelerated HTN (hypertension): Poorly controlled, likely from pain.Continue losartan, verapamil, hydralazine p.o., continue prn po clonidine and hydralazine. Cont pain control.  DVT prophylaxis:SCD. Code Status:DNR Family Communication:Updated Daughter over the phone. Disposition:Plan on hold.Pending surgical intervention on Monday.  Consultants:  Neurosurgery  Procedures: none  Subjective: Patient still complaining of back pain.  denies nausea vomiting, diarrhea drinking and eating  well .  Able to move her extremities.  Objective: Vitals:   10/15/18 2148 10/16/18 0430 10/16/18 0829 10/16/18 1044  BP: (!) 143/72 (!) 194/86 (!) 162/79 (!) 143/61  Pulse: 74 80  81  Resp: 12 16  18   Temp: 97.6 F (36.4 C) (!) 97.5 F (36.4 C)  98.1 F (36.7 C)  TempSrc: Oral Oral  Oral  SpO2: 97% 97%  96%    Intake/Output Summary (Last 24 hours) at 10/16/2018 1544 Last data filed at 10/16/2018 0900 Gross per 24 hour  Intake 1550.41 ml  Output 600 ml  Net 950.41 ml   There were no vitals filed for this visit. Weight change:   There is no height or weight on file to calculate BMI.  Examination:  General exam:Calm, comfortable, in mild/moderate pain. HEENT:Oral mucosa moist, Ear/Nose WNL grossly Respiratory system: Bilateral equal air entry, no crackles and wheezing Cardiovascular system: S1 & S2 heard, No JVD/murmurs.No pedal edema. Gastrointestinal system: Abdomen soft, nontender non-distended, BS +. Nervous System:Alert/awake/oriented at baseline.  Able to move UE and LE Extremities: No edema,distal peripheral pulses palpable. Skin: No rashes,no icterus. MSK: Normal muscle bulk,tone ,power  Data Reviewed: I have personally reviewed following labs and imaging studies  CBC: Recent Labs  Lab 10/11/18 1122 10/13/18 0548 10/14/18 0329  WBC 11.8* 11.1* 10.7*  HGB 13.5 12.2 12.0  HCT 41.9 37.0 35.7*  MCV 99.5 95.6 95.2  PLT 330 291 073   Basic Metabolic Panel: Recent Labs  Lab 10/11/18 1122 10/13/18 0548 10/14/18 0329 10/15/18 0928 10/16/18 0237  NA 130* 130* 129* 124* 125*  K 4.4 3.8 4.2 3.8 4.3  CL 96* 100 101 94* 97*  CO2 27 24  23 23 22   GLUCOSE 106* 97 99 136* 104*  BUN 28* 22 19 11 12   CREATININE 0.52 0.61 0.62 0.56 0.67  CALCIUM 8.7* 8.1* 8.2* 7.8* 7.8*   GFR: Estimated Creatinine Clearance: 41.8 mL/min (by C-G formula based on SCr of 0.67 mg/dL). Liver Function Tests: No results for input(s): AST, ALT, ALKPHOS, BILITOT, PROT, ALBUMIN in the  last 168 hours. No results for input(s): LIPASE, AMYLASE in the last 168 hours. No results for input(s): AMMONIA in the last 168 hours. Coagulation Profile: Recent Labs  Lab 10/11/18 1122  INR 0.99   Cardiac Enzymes: No results for input(s): CKTOTAL, CKMB, CKMBINDEX, TROPONINI in the last 168 hours. BNP (last 3 results) No results for input(s): PROBNP in the last 8760 hours. HbA1C: No results for input(s): HGBA1C in the last 72 hours. CBG: No results for input(s): GLUCAP in the last 168 hours. Lipid Profile: No results for input(s): CHOL, HDL, LDLCALC, TRIG, CHOLHDL, LDLDIRECT in the last 72 hours. Thyroid Function Tests: No results for input(s): TSH, T4TOTAL, FREET4, T3FREE, THYROIDAB in the last 72 hours. Anemia Panel: No results for input(s): VITAMINB12, FOLATE, FERRITIN, TIBC, IRON, RETICCTPCT in the last 72 hours. Sepsis Labs: No results for input(s): PROCALCITON, LATICACIDVEN in the last 168 hours.  Recent Results (from the past 240 hour(s))  Surgical pcr screen     Status: None   Collection Time: 10/15/18 10:45 AM  Result Value Ref Range Status   MRSA, PCR NEGATIVE NEGATIVE Final   Staphylococcus aureus NEGATIVE NEGATIVE Final    Comment: (NOTE) The Xpert SA Assay (FDA approved for NASAL specimens in patients 51 years of age and older), is one component of a comprehensive surveillance program. It is not intended to diagnose infection nor to guide or monitor treatment. Performed at [facility], [place] [address], [place], [place] [address]    Radiology Studies: No results found.  Scheduled Meds: . acetaminophen  1,000 mg Oral QID  . hydrALAZINE  25 mg Oral Q8H  . losartan  100 mg Oral q morning - 10a  . methocarbamol  500 mg Oral QID  . omega-3 acid ethyl esters  1 g Oral Daily  . senna  1 tablet Oral Daily  . sodium chloride  1 g Oral BID WC  . verapamil  360 mg Oral q morning - 10a   Continuous Infusions: . sodium chloride 75 mL/hr at 10/16/18 0300    . sodium chloride 75 mL/hr at 10/16/18 1412     LOS: 2 days   Time spent: More than 50% of that time was spent in counseling and/or coordination of care.  Antonieta Pert, MD [facility] Pager [phone]  If 7PM-7AM, please contact night-coverage [email or web address] Password [facility] 10/16/2018, 3:44 PM

## 2018-10-16 NOTE — Care Management Important Message (Signed)
Important Message  Patient Details  Name: Laurie Galloway MRN: [id number] Date of Birth: [birth year]   Medicare Important Message Given:  Yes    Honor Frison 10/16/2018, 4:01 PM

## 2018-10-16 NOTE — Plan of Care (Signed)

## 2018-10-16 NOTE — Progress Notes (Signed)
Overall stable.  Patient's planned surgery today canceled by anesthesia secondary to hyponatremia.  Patient currently awake and alert.  She is oriented and reasonably appropriate.  Still with marked back pain.  Motor examination intact bilaterally.  Sensory examination nonfocal.  Patient continues to have severe pain with movement.  Etiology of hyponatremia likely secondary to stress and pain with increased overall cortisol level and some component of SIADH.  Continue supportive care for now.  Attempt to correct sodium through the weekend.  Plan for surgery Monday afternoon.

## 2018-10-16 NOTE — Care Management Important Message (Signed)
Important Message  Patient Details  Name: Laurie Galloway MRN: [id number] Date of Birth: 03/09/1930   Medicare Important Message Given:  Yes    Erenest Rasher, RN 10/16/2018, 2:06 PM

## 2018-10-16 NOTE — Anesthesia Preprocedure Evaluation (Deleted)
Anesthesia Evaluation    Reviewed: Allergy & Precautions, Patient's Chart, lab work & pertinent test results  History of Anesthesia Complications (+) PONV and history of anesthetic complications  Airway        Dental   Pulmonary neg pulmonary ROS,           Cardiovascular hypertension,      Neuro/Psych  Neuromuscular disease (Carpal tunnel) negative psych ROS   GI/Hepatic negative GI ROS, Neg liver ROS,   Endo/Other  negative endocrine ROS  Renal/GU negative Renal ROS     Musculoskeletal  (+) Arthritis ,   Abdominal   Peds  Hematology  (+) anemia ,   Anesthesia Other Findings   Reproductive/Obstetrics                             Anesthesia Physical Anesthesia Plan Anesthesia Quick Evaluation

## 2018-10-17 ENCOUNTER — Encounter (HOSPITAL_COMMUNITY): Payer: Self-pay | Admitting: Family Medicine

## 2018-10-17 DIAGNOSIS — E871 Hypo-osmolality and hyponatremia: Secondary | ICD-10-CM

## 2018-10-17 DIAGNOSIS — I700 Atherosclerosis of aorta: Secondary | ICD-10-CM

## 2018-10-17 HISTORY — DX: Hypo-osmolality and hyponatremia: E87.1

## 2018-10-17 HISTORY — DX: Atherosclerosis of aorta: I70.0

## 2018-10-17 LAB — BASIC METABOLIC PANEL
Anion gap: 6 (ref 5–15)
BUN: 14 mg/dL (ref 8–23)
CO2: 21 mmol/L — ABNORMAL LOW (ref 22–32)
Calcium: 7.6 mg/dL — ABNORMAL LOW (ref 8.9–10.3)
Chloride: 101 mmol/L (ref 98–111)
Creatinine, Ser: 0.59 mg/dL (ref 0.44–1.00)
GFR calc Af Amer: >60 mL/min (ref >60)
GFR calc non Af Amer: >60 mL/min (ref >60)
Glucose, Bld: 92 mg/dL (ref 70–99)
Potassium: 4.3 mmol/L (ref 3.5–5.1)
Sodium: 128 mmol/L — ABNORMAL LOW (ref 135–145)

## 2018-10-17 LAB — TSH: TSH: 2.196 u[IU]/mL (ref 0.350–4.500)

## 2018-10-17 LAB — URIC ACID: Uric Acid, Serum: 2 mg/dL — ABNORMAL LOW (ref 2.5–7.1)

## 2018-10-17 NOTE — Progress Notes (Signed)
  PROGRESS NOTE  Laurie Galloway FTD:[id number] DOB: 12-04-1929 DOA: 10/12/2018 PCP: Mayra Neer, MD  Brief Narrative: 84 year old woman PMH acute worsening low back pain for several weeks, history of fall in August, was diagnosed in ED on 11/11 with CT lumbar spine showing acute fracture T12 and transverse process L1.  She was admitted for pain control and neurosurgery evaluation.  Operative intervention was planned, initially 11/15.  Surgery was held secondary to hyponatremia.  Assessment/Plan Severe low back pain secondary to acute fractures T12, transverse process L1. --Neurosurgery plans operative intervention when sodium stable.  Hyponatremia --Initially treated with normal saline, started on fluid restriction, placed on salt tablets.  Sodium improving today.  Continue current management. --TSH within normal limits. --BMP in a.m.  Essential hypertension --Stable.  Continue verapamil.    Aortic atherosclerosis --No inpatient treatment.  Follow-up as an outpatient.  DVT prophylaxis: SCDs Code Status: DNR Family Communication:  Disposition Plan: pending    Murray Hodgkins, MD  [facility] Direct contact: [phone] --Via amion app OR  --[email or web address]; password [email or web address]  7PM-7AM contact night coverage as above 10/17/2018, 3:52 PM  LOS: 3 days   Consultants:  Neurosurgery  Procedures:    Antimicrobials:    Interval history/Subjective: Feels better now, but had pain earlier. Pain medicine helping. No n/v. Tolerating diet. No numbness or difficulty moving legs.  Objective: Vitals:  Vitals:   10/17/18 0921 10/17/18 1548  BP: (!) 166/77 (!) 144/79  Pulse: 80 89  Resp: 16 17  Temp:  97.7 F (36.5 C)  SpO2: 96% 97%    Exam:  Constitutional:  . Appears calm and comfortable, sitting in bed eating lunch Respiratory:  . CTA bilaterally, no w/r/r.  . Respiratory effort normal.  Cardiovascular:  . RRR, no m/r/g . No LE extremity edema     Musculoskeletal:  . RLE, LLE   o Moves both legs to command Psychiatric:  . Mental status o Mood, affect appropriate  I have personally reviewed the following:   Data: . Sodium 130 > 129> 124> 125> 128 . BMP unremarkable . TSH within normal limits  Scheduled Meds: . acetaminophen  1,000 mg Oral QID  . hydrALAZINE  25 mg Oral Q8H  . methocarbamol  500 mg Oral QID  . omega-3 acid ethyl esters  1 g Oral Daily  . senna  1 tablet Oral Daily  . sodium chloride  1 g Oral TID WC  . verapamil  360 mg Oral q morning - 10a   Continuous Infusions: . sodium chloride 75 mL/hr at 10/17/18 0350    Principal Problem:   Compression fracture of body of thoracic vertebra (HCC) Active Problems:   HTN (hypertension)   Back pain   Pathologic thoracic fracture, initial encounter   Hyponatremia   Aortic atherosclerosis ([place])   LOS: 3 days

## 2018-10-17 NOTE — Progress Notes (Signed)
Physical Therapy Treatment Patient Details Name: Laurie Galloway MRN: [id number] DOB: Oct 08, 1930 Today's Date: 10/17/2018    History of Present Illness Pt is an 84 y/o female admitted secondary to worsening back pain. Pt with recent trip to ED and was found to have T12 compression fracture, and transverse process fracture at L1. Imaging also revelaed old compression fractures at L2-4. PMH includes HTN and lumbar surgery.     PT Comments    Pt with more tolerable pain today, completing bed mobility with supervision and good adherence to back precautions. She was able to transfer to chair with Stedy lift, MinAx2 for stability during powerup and maintaining stance. Pt reports mild dizziness with transfers which dissipates quickly. Educated on proper alignment of TLSO application. DC plan remains appropriate, PT will continue to follow acutely.   Follow Up Recommendations  SNF;Supervision/Assistance - 24 hour     Equipment Recommendations  None recommended by PT       Precautions / Restrictions Precautions Precautions: Back;Fall Precaution Booklet Issued: No Required Braces or Orthoses: Spinal Brace Spinal Brace: Thoracolumbosacral orthotic Restrictions Weight Bearing Restrictions: No    Mobility  Bed Mobility Overal bed mobility: Needs Assistance Bed Mobility: Rolling;Sidelying to Sit Rolling: Supervision Sidelying to sit: Supervision       General bed mobility comments: Pt adhered to back precautions, completing roll to sidelying and sidelying to sit with supervision. cuing needed only at initiation of movement to start with roll.  Transfers Overall transfer level: Needs assistance   Transfers: Sit to/from Stand Sit to Stand: Min assist;+2 physical assistance         General transfer comment: Pt minAx2 for powerup into stance and for stability, able to maintain stance while stedy lift was positioned.      Balance Overall balance assessment: Needs  assistance Sitting-balance support: Bilateral upper extremity supported;Feet supported Sitting balance-Leahy Scale: Fair     Standing balance support: Bilateral upper extremity supported;During functional activity Standing balance-Leahy Scale: Poor Standing balance comment: reliance on UE, support from stedy                            Cognition Arousal/Alertness: Awake/alert Behavior During Therapy: WFL for tasks assessed/performed Overall Cognitive Status: Within Functional Limits for tasks assessed                                           General Comments General comments (skin integrity, edema, etc.): Pt completed bed mobility and transfer with minimal pain, adhering to back precautions. Educated on proper application of TLSO.      Pertinent Vitals/Pain Pain Assessment: Faces Faces Pain Scale: Hurts little more Pain Location: back and BLEs  Pain Descriptors / Indicators: Aching;Grimacing;Sharp Pain Intervention(s): Limited activity within patient's tolerance;Monitored during session;Repositioned;Premedicated before session     PT Goals (current goals can now be found in the care plan section) Acute Rehab PT Goals Patient Stated Goal: to decrease pain PT Goal Formulation: With patient Time For Goal Achievement: 10/27/18 Potential to Achieve Goals: Fair Progress towards PT goals: Progressing toward goals    Frequency    Min 2X/week      PT Plan Current plan remains appropriate       AM-PAC PT "6 Clicks" Daily Activity  Outcome Measure  Difficulty turning over in bed (including adjusting bedclothes, sheets and blankets)?: Unable Difficulty moving  from lying on back to sitting on the side of the bed? : Unable Difficulty sitting down on and standing up from a chair with arms (e.g., wheelchair, bedside commode, etc,.)?: Unable Help needed moving to and from a bed to chair (including a wheelchair)?: A Lot Help needed walking in hospital  room?: Total Help needed climbing 3-5 steps with a railing? : Total 6 Click Score: 7    End of Session Equipment Utilized During Treatment: Gait belt Activity Tolerance: Patient tolerated treatment well Patient left: in chair;with call bell/phone within reach;with chair alarm set;with nursing/sitter in room Nurse Communication: Mobility status;Need for lift equipment PT Visit Diagnosis: Unsteadiness on feet (R26.81);Repeated falls (R29.6);Muscle weakness (generalized) (M62.81);History of falling (Z91.81);Pain Pain - part of body: Leg(back, B LE)     Time: 1601-0932 PT Time Calculation (min) (ACUTE ONLY): 28 min  Charges:  $Therapeutic Activity: 23-37 mins                     Vernell Morgans, SPT Acute Rehabilitation Services Office [phone]    Vernell Morgans 10/17/2018, 5:12 PM

## 2018-10-17 NOTE — Plan of Care (Signed)

## 2018-10-17 NOTE — Progress Notes (Signed)
Neurosurgery Service Progress Note  Subjective: No acute events overnight, back pain was significant early this morning but better by mid morning when I came to see her.   Objective: Vitals:   10/16/18 2055 10/16/18 2323 10/17/18 0527 10/17/18 0921  BP: (!) 143/56 (!) 150/58 (!) 178/67 (!) 166/77  Pulse: 76 73 72 80  Resp:    16  Temp: 98.9 F (37.2 C) 98.6 F (37 C) 98.3 F (36.8 C)   TempSrc: Oral Oral Oral   SpO2: 94% 95% 95% 96%   Temp (24hrs), Avg:98.7 F (37.1 C), Min:98.3 F (36.8 C), Max:98.9 F (37.2 C)  CBC Latest Ref Rng & Units 10/14/2018 10/13/2018 10/11/2018  WBC 4.0 - 10.5 K/uL 10.7(H) 11.1(H) 11.8(H)  Hemoglobin 12.0 - 15.0 g/dL 12.0 12.2 13.5  Hematocrit 36.0 - 46.0 % 35.7(L) 37.0 41.9  Platelets 150 - 400 K/uL 266 291 330   BMP Latest Ref Rng & Units 10/17/2018 10/16/2018 10/15/2018  Glucose 70 - 99 mg/dL 92 104(H) 136(H)  BUN 8 - 23 mg/dL 14 12 11   Creatinine 0.44 - 1.00 mg/dL 0.59 0.67 0.56  Sodium 135 - 145 mmol/L 128(L) 125(L) 124(L)  Potassium 3.5 - 5.1 mmol/L 4.3 4.3 3.8  Chloride 98 - 111 mmol/L 101 97(L) 94(L)  CO2 22 - 32 mmol/L 21(L) 22 23  Calcium 8.9 - 10.3 mg/dL 7.6(L) 7.8(L) 7.8(L)    Intake/Output Summary (Last 24 hours) at 10/17/2018 1413 Last data filed at 10/17/2018 0900 Gross per 24 hour  Intake 1138.45 ml  Output 2000 ml  Net -861.55 ml    Current Facility-Administered Medications:  .  0.9 %  sodium chloride infusion, , Intravenous, Continuous, Pool, Mallie Mussel, MD, Last Rate: 75 mL/hr at 10/17/18 0350 .  acetaminophen (TYLENOL) tablet 1,000 mg, 1,000 mg, Oral, QID, Vann, Jessica U, DO, 1,000 mg at 10/17/18 0921 .  cloNIDine (CATAPRES) tablet 0.1 mg, 0.1 mg, Oral, Q12H PRN, Kc, Ramesh, MD .  hydrALAZINE (APRESOLINE) tablet 25 mg, 25 mg, Oral, Q6H PRN, Kc, Ramesh, MD, 25 mg at 10/17/18 0537 .  hydrALAZINE (APRESOLINE) tablet 25 mg, 25 mg, Oral, Q8H, Kc, Ramesh, MD, 25 mg at 10/17/18 0534 .  losartan (COZAAR) tablet 100 mg, 100 mg,  Oral, q morning - 10a, Rise Patience, MD, 100 mg at 10/17/18 0921 .  methocarbamol (ROBAXIN) tablet 500 mg, 500 mg, Oral, QID, Vann, Jessica U, DO, 500 mg at 10/17/18 0921 .  morphine 2 MG/ML injection 1 mg, 1 mg, Intravenous, Q4H PRN, Vann, Jessica U, DO, 1 mg at 10/17/18 0640 .  omega-3 acid ethyl esters (LOVAZA) capsule 1 g, 1 g, Oral, Daily, Rise Patience, MD, 1 g at 10/17/18 8563 .  ondansetron (ZOFRAN) tablet 4 mg, 4 mg, Oral, Q6H PRN **OR** ondansetron (ZOFRAN) injection 4 mg, 4 mg, Intravenous, Q6H PRN, Rise Patience, MD .  oxyCODONE (Oxy IR/ROXICODONE) immediate release tablet 5 mg, 5 mg, Oral, Q4H PRN, Rise Patience, MD, 5 mg at 10/17/18 0251 .  polyethylene glycol (MIRALAX / GLYCOLAX) packet 17 g, 17 g, Oral, Daily PRN, Rise Patience, MD, 17 g at 10/17/18 0919 .  senna (SENOKOT) tablet 8.6 mg, 1 tablet, Oral, Daily, Rise Patience, MD, 8.6 mg at 10/17/18 0921 .  sodium chloride tablet 1 g, 1 g, Oral, TID WC, Kc, Ramesh, MD, 1 g at 10/17/18 1234 .  verapamil (CALAN-SR) CR tablet 360 mg, 360 mg, Oral, q morning - 10a, Rise Patience, MD, 360 mg at 10/17/18 [phone]  Physical Exam: AOx3, FCx4 without preference, SILTx4  Assessment & Plan: 84 y.o. woman with spinal fracture and deformity, currently optimizing for planned surgical intervention with Dr. Annette Stable. -no change in neurosurgical plan of care  Judith Part  10/17/18 2:13 PM

## 2018-10-18 LAB — BASIC METABOLIC PANEL
Anion gap: 7 (ref 5–15)
BUN: 15 mg/dL (ref 8–23)
CO2: 22 mmol/L (ref 22–32)
Calcium: 7.8 mg/dL — ABNORMAL LOW (ref 8.9–10.3)
Chloride: 100 mmol/L (ref 98–111)
Creatinine, Ser: 0.51 mg/dL (ref 0.44–1.00)
GFR calc Af Amer: >60 mL/min (ref >60)
GFR calc non Af Amer: >60 mL/min (ref >60)
Glucose, Bld: 90 mg/dL (ref 70–99)
Potassium: 4 mmol/L (ref 3.5–5.1)
Sodium: 129 mmol/L — ABNORMAL LOW (ref 135–145)

## 2018-10-18 LAB — TYPE AND SCREEN
ABO/RH(D): O POS
Antibody Screen: NEGATIVE

## 2018-10-18 MED ORDER — CALCIUM CARBONATE-VITAMIN D 500-200 MG-UNIT PO TABS
1.0000 | ORAL_TABLET | Freq: Two times a day (BID) | ORAL | Status: DC
  Administered 2018-10-18 – 2018-10-22 (×7): 1 via ORAL
  Filled 2018-10-18 (×8): qty 1

## 2018-10-18 MED ORDER — VITAMIN D 25 MCG (1000 UNIT) PO TABS
2000.0000 [IU] | ORAL_TABLET | Freq: Every day | ORAL | Status: DC
  Administered 2018-10-18 – 2018-10-22 (×4): 2000 [IU] via ORAL
  Filled 2018-10-18 (×4): qty 2

## 2018-10-18 NOTE — Plan of Care (Signed)

## 2018-10-18 NOTE — Progress Notes (Addendum)
Neurosurgery Service Progress Note  Subjective: No acute events overnight, up in a chair with brace when I saw her this morning  Objective: Vitals:   10/17/18 1548 10/17/18 1922 10/18/18 0441 10/18/18 0905  BP: (!) 144/79 (!) 146/59 (!) 153/76 (!) 158/75  Pulse: 89 77 80 91  Resp: 17   17  Temp: 97.7 F (36.5 C) (!) 97.5 F (36.4 C) 97.9 F (36.6 C) 98.5 F (36.9 C)  TempSrc: Oral Oral Oral Oral  SpO2: 97% 98% 97% 97%   Temp (24hrs), Avg:97.9 F (36.6 C), Min:97.5 F (36.4 C), Max:98.5 F (36.9 C)  CBC Latest Ref Rng & Units 10/14/2018 10/13/2018 10/11/2018  WBC 4.0 - 10.5 K/uL 10.7(H) 11.1(H) 11.8(H)  Hemoglobin 12.0 - 15.0 g/dL 12.0 12.2 13.5  Hematocrit 36.0 - 46.0 % 35.7(L) 37.0 41.9  Platelets 150 - 400 K/uL 266 291 330   BMP Latest Ref Rng & Units 10/18/2018 10/17/2018 10/16/2018  Glucose 70 - 99 mg/dL 90 92 104(H)  BUN 8 - 23 mg/dL 15 14 12   Creatinine 0.44 - 1.00 mg/dL 0.51 0.59 0.67  Sodium 135 - 145 mmol/L 129(L) 128(L) 125(L)  Potassium 3.5 - 5.1 mmol/L 4.0 4.3 4.3  Chloride 98 - 111 mmol/L 100 101 97(L)  CO2 22 - 32 mmol/L 22 21(L) 22  Calcium 8.9 - 10.3 mg/dL 7.8(L) 7.6(L) 7.8(L)    Intake/Output Summary (Last 24 hours) at 10/18/2018 1047 Last data filed at 10/17/2018 1628 Gross per 24 hour  Intake -  Output 800 ml  Net -800 ml    Current Facility-Administered Medications:  .  acetaminophen (TYLENOL) tablet 1,000 mg, 1,000 mg, Oral, QID, Vann, Jessica U, DO, 1,000 mg at 10/18/18 0905 .  calcium-vitamin D (OSCAL WITH D) 500-200 MG-UNIT per tablet 1 tablet, 1 tablet, Oral, BID WC, Purohit, Shrey C, MD .  cholecalciferol (VITAMIN D3) tablet 2,000 Units, 2,000 Units, Oral, Daily, Purohit, Shrey C, MD .  cloNIDine (CATAPRES) tablet 0.1 mg, 0.1 mg, Oral, Q12H PRN, Kc, Ramesh, MD .  hydrALAZINE (APRESOLINE) tablet 25 mg, 25 mg, Oral, Q6H PRN, Kc, Ramesh, MD, 25 mg at 10/17/18 0537 .  methocarbamol (ROBAXIN) tablet 500 mg, 500 mg, Oral, QID, Vann, Jessica  U, DO, 500 mg at 10/18/18 0902 .  morphine 2 MG/ML injection 1 mg, 1 mg, Intravenous, Q4H PRN, Vann, Jessica U, DO, 1 mg at 10/17/18 0640 .  omega-3 acid ethyl esters (LOVAZA) capsule 1 g, 1 g, Oral, Daily, Rise Patience, MD, 1 g at 10/18/18 0902 .  ondansetron (ZOFRAN) tablet 4 mg, 4 mg, Oral, Q6H PRN **OR** ondansetron (ZOFRAN) injection 4 mg, 4 mg, Intravenous, Q6H PRN, Rise Patience, MD .  oxyCODONE (Oxy IR/ROXICODONE) immediate release tablet 5 mg, 5 mg, Oral, Q4H PRN, Rise Patience, MD, 5 mg at 10/18/18 0115 .  polyethylene glycol (MIRALAX / GLYCOLAX) packet 17 g, 17 g, Oral, Daily PRN, Rise Patience, MD, 17 g at 10/17/18 0919 .  senna (SENOKOT) tablet 8.6 mg, 1 tablet, Oral, Daily, Rise Patience, MD, 8.6 mg at 10/18/18 0902 .  sodium chloride tablet 1 g, 1 g, Oral, TID WC, Kc, Ramesh, MD, 1 g at 10/18/18 0902 .  verapamil (CALAN-SR) CR tablet 360 mg, 360 mg, Oral, q morning - 10a, Rise Patience, MD, 360 mg at 10/18/18 5573   Physical Exam: AOx3, FCx4 without preference, SILTx4  Assessment & Plan: 84 y.o. woman with spinal fracture and deformity, currently optimizing for planned surgical intervention with Dr. Annette Stable. -  Na improving -type & screen expires today, repeat ordered for OR tomorrow -NPO p MN order placed -no change in neurosurgical plan of care  Judith Part  10/18/18 10:47 AM

## 2018-10-18 NOTE — Progress Notes (Signed)
PROGRESS NOTE    Laurie Galloway  DDU:[id number] DOB: 1929-12-11 DOA: 10/12/2018 PCP: Mayra Neer, MD   Brief Narrative:  84 year old with past medical history relevant for osteoporosis, hypertension, chronic back pain admitted on 10/12/2018 with acute on chronic back pain and difficulty ambulating with evidence of acute T12 fracture and L1 transverse process fracture pending neurosurgical treatment with course comp gated by hyponatremia.   Assessment & Plan:   Principal Problem:   Compression fracture of body of thoracic vertebra (HCC) Active Problems:   HTN (hypertension)   Back pain   Pathologic thoracic fracture, initial encounter   Hyponatremia   Aortic atherosclerosis (HCC)   #) Hyponatremia: Stable and not profoundly low.  Likely secondary to pain which is a potent releaser of SIADH. -Continue salt tablets 1 g 3 times daily with meals -Discontinue IV fluids -Repeat BMP tomorrow  #) Acute T12/L1 fracture: Likely secondary mechanical fall. -Pending OR with Dr. Eddie Dibbles from neurosurgery -Continue pain control  #) Hypertension: -Hold losartan 100 mg every morning due to hyponatremia -Continue verapamil 360 mg every morning -Continue PRN oral medications including hydralazine and clonidine for systolic blood pressure greater than 180  #) Osteoporosis: -Hold denosumab every 6 months - Restart home vitamin D and calcium tablets  Fluids: Tolerating p.o. Elect lites: Monitor and supplement Nutrition: Regular diet  Prophylaxis: SCDs  Disposition: Pending neurosurgery  DNR    Consultants:   Neurosurgery, Dr. Trenton Gammon  Procedures:   None  Antimicrobials:   None   Subjective: This morning patient reports that her pain is somewhat better controlled.  She denies any lower extremity numbness or weakness.  She denies any nausea, vomiting, diarrhea, cough, congestion, rhinorrhea.  Objective: Vitals:   10/17/18 1548 10/17/18 1922 10/18/18 0441 10/18/18 0905  BP:  (!) 144/79 (!) 146/59 (!) 153/76 (!) 158/75  Pulse: 89 77 80 91  Resp: 17   17  Temp: 97.7 F (36.5 C) (!) 97.5 F (36.4 C) 97.9 F (36.6 C) 98.5 F (36.9 C)  TempSrc: Oral Oral Oral Oral  SpO2: 97% 98% 97% 97%    Intake/Output Summary (Last 24 hours) at 10/18/2018 0954 Last data filed at 10/17/2018 1628 Gross per 24 hour  Intake -  Output 800 ml  Net -800 ml   There were no vitals filed for this visit.  Examination:  General exam: Appears calm and comfortable  Respiratory system: Clear to auscultation. Respiratory effort normal. Cardiovascular system: Regular rate and rhythm, no murmurs  gastrointestinal system: Abdomen is nondistended, soft and nontender. No organomegaly or masses felt. Normal bowel sounds heard. Central nervous system: Alert and oriented.  Bilateral lower extremity strength is 5 out of 5. Extremities: No lower extremity edema Skin: No rashes over visible skin Psychiatry: Judgement and insight appear normal. Mood & affect appropriate.     Data Reviewed: I have personally reviewed following labs and imaging studies  CBC: Recent Labs  Lab 10/11/18 1122 10/13/18 0548 10/14/18 0329  WBC 11.8* 11.1* 10.7*  HGB 13.5 12.2 12.0  HCT 41.9 37.0 35.7*  MCV 99.5 95.6 95.2  PLT 330 291 237   Basic Metabolic Panel: Recent Labs  Lab 10/14/18 0329 10/15/18 0928 10/16/18 0237 10/17/18 0426 10/18/18 0345  NA 129* 124* 125* 128* 129*  K 4.2 3.8 4.3 4.3 4.0  CL 101 94* 97* 101 100  CO2 23 23 22  21* 22  GLUCOSE 99 136* 104* 92 90  BUN 19 11 12 14 15   CREATININE 0.62 0.56 0.67 0.59 0.51  CALCIUM 8.2* 7.8* 7.8* 7.6* 7.8*   GFR: Estimated Creatinine Clearance: 41.8 mL/min (by C-G formula based on SCr of 0.51 mg/dL). Liver Function Tests: No results for input(s): AST, ALT, ALKPHOS, BILITOT, PROT, ALBUMIN in the last 168 hours. No results for input(s): LIPASE, AMYLASE in the last 168 hours. No results for input(s): AMMONIA in the last 168  hours. Coagulation Profile: Recent Labs  Lab 10/11/18 1122  INR 0.99   Cardiac Enzymes: No results for input(s): CKTOTAL, CKMB, CKMBINDEX, TROPONINI in the last 168 hours. BNP (last 3 results) No results for input(s): PROBNP in the last 8760 hours. HbA1C: No results for input(s): HGBA1C in the last 72 hours. CBG: No results for input(s): GLUCAP in the last 168 hours. Lipid Profile: No results for input(s): CHOL, HDL, LDLCALC, TRIG, CHOLHDL, LDLDIRECT in the last 72 hours. Thyroid Function Tests: Recent Labs    10/17/18 0426  TSH 2.196   Anemia Panel: No results for input(s): VITAMINB12, FOLATE, FERRITIN, TIBC, IRON, RETICCTPCT in the last 72 hours. Sepsis Labs: No results for input(s): PROCALCITON, LATICACIDVEN in the last 168 hours.  Recent Results (from the past 240 hour(s))  Surgical pcr screen     Status: None   Collection Time: 10/15/18 10:45 AM  Result Value Ref Range Status   MRSA, PCR NEGATIVE NEGATIVE Final   Staphylococcus aureus NEGATIVE NEGATIVE Final    Comment: (NOTE) The Xpert SA Assay (FDA approved for NASAL specimens in patients 40 years of age and older), is one component of a comprehensive surveillance program. It is not intended to diagnose infection nor to guide or monitor treatment. Performed at [facility], [place] [address] [place],  [address]          Radiology Studies: No results found.      Scheduled Meds: . acetaminophen  1,000 mg Oral QID  . hydrALAZINE  25 mg Oral Q8H  . methocarbamol  500 mg Oral QID  . omega-3 acid ethyl esters  1 g Oral Daily  . senna  1 tablet Oral Daily  . sodium chloride  1 g Oral TID WC  . verapamil  360 mg Oral q morning - 10a   Continuous Infusions: . sodium chloride 75 mL/hr at 10/18/18 0507     LOS: 4 days    Time spent: [place], MD [facility]  If 7PM-7AM, please contact night-coverage [email or web address] Password [email or web address] 10/18/2018, 9:54 AM

## 2018-10-18 NOTE — Plan of Care (Signed)

## 2018-10-19 ENCOUNTER — Inpatient Hospital Stay (HOSPITAL_COMMUNITY)
Admission: EM | Disposition: A | Discharge status: Skilled Nursing Facility | Payer: Self-pay | Source: Home / Self Care | Attending: Internal Medicine

## 2018-10-19 ENCOUNTER — Encounter (HOSPITAL_COMMUNITY): Payer: Self-pay | Admitting: Critical Care Medicine

## 2018-10-19 ENCOUNTER — Inpatient Hospital Stay (HOSPITAL_COMMUNITY): Payer: Medicare HMO | Admitting: Certified Registered Nurse Anesthetist

## 2018-10-19 ENCOUNTER — Inpatient Hospital Stay (HOSPITAL_COMMUNITY): Payer: Medicare HMO

## 2018-10-19 HISTORY — PX: LAMINECTOMY WITH POSTERIOR LATERAL ARTHRODESIS LEVEL 4: SHX6338

## 2018-10-19 HISTORY — PX: VERTEBROPLASTY: SHX113

## 2018-10-19 LAB — BASIC METABOLIC PANEL
Anion gap: 3 — ABNORMAL LOW (ref 5–15)
BUN: 14 mg/dL (ref 8–23)
CO2: 24 mmol/L (ref 22–32)
Calcium: 7.9 mg/dL — ABNORMAL LOW (ref 8.9–10.3)
Chloride: 101 mmol/L (ref 98–111)
Creatinine, Ser: 0.61 mg/dL (ref 0.44–1.00)
GFR calc Af Amer: >60 mL/min (ref >60)
GFR calc non Af Amer: >60 mL/min (ref >60)
Glucose, Bld: 98 mg/dL (ref 70–99)
Potassium: 4.2 mmol/L (ref 3.5–5.1)
Sodium: 128 mmol/L — ABNORMAL LOW (ref 135–145)

## 2018-10-19 SURGERY — LAMINECTOMY WITH POSTERIOR LATERAL ARTHRODESIS LEVEL 4
Anesthesia: General | Site: Back

## 2018-10-19 MED ORDER — BUPIVACAINE HCL (PF) 0.25 % IJ SOLN
INTRAMUSCULAR | Status: AC
  Filled 2018-10-19: qty 30

## 2018-10-19 MED ORDER — LIDOCAINE HCL (CARDIAC) PF 100 MG/5ML IV SOSY
PREFILLED_SYRINGE | INTRAVENOUS | Status: DC | PRN
  Administered 2018-10-19: 60 mg via INTRAVENOUS

## 2018-10-19 MED ORDER — PHENYLEPHRINE 40 MCG/ML (10ML) SYRINGE FOR IV PUSH (FOR BLOOD PRESSURE SUPPORT)
PREFILLED_SYRINGE | INTRAVENOUS | Status: AC
  Filled 2018-10-19: qty 10

## 2018-10-19 MED ORDER — SODIUM CHLORIDE 0.9 % IV SOLN
250.0000 mL | INTRAVENOUS | Status: DC
  Administered 2018-10-19: 250 mL via INTRAVENOUS

## 2018-10-19 MED ORDER — DIAZEPAM 5 MG PO TABS
5.0000 mg | ORAL_TABLET | Freq: Four times a day (QID) | ORAL | Status: DC | PRN
  Administered 2018-10-22: 5 mg via ORAL
  Filled 2018-10-19: qty 1

## 2018-10-19 MED ORDER — BISACODYL 10 MG RE SUPP
10.0000 mg | Freq: Every day | RECTAL | Status: DC | PRN
  Filled 2018-10-19: qty 1

## 2018-10-19 MED ORDER — FENTANYL CITRATE (PF) 100 MCG/2ML IJ SOLN
INTRAMUSCULAR | Status: AC
  Filled 2018-10-19: qty 2

## 2018-10-19 MED ORDER — ONDANSETRON HCL 4 MG/2ML IJ SOLN: 4.0000 mg | Freq: Four times a day (QID) | INTRAMUSCULAR | Status: DC | PRN

## 2018-10-19 MED ORDER — FENTANYL CITRATE (PF) 250 MCG/5ML IJ SOLN
INTRAMUSCULAR | Status: DC | PRN
  Administered 2018-10-19 (×7): 50 ug via INTRAVENOUS

## 2018-10-19 MED ORDER — MENTHOL 3 MG MT LOZG: 1.0000 | LOZENGE | OROMUCOSAL | Status: DC | PRN

## 2018-10-19 MED ORDER — POLYETHYLENE GLYCOL 3350 17 G PO PACK
17.0000 g | PACK | Freq: Every day | ORAL | Status: DC | PRN
  Administered 2018-10-22: 17 g via ORAL
  Filled 2018-10-19: qty 1

## 2018-10-19 MED ORDER — SODIUM CHLORIDE 0.9% FLUSH: 3.0000 mL | INTRAVENOUS | Status: DC | PRN

## 2018-10-19 MED ORDER — HYDROCODONE-ACETAMINOPHEN 10-325 MG PO TABS
1.0000 | ORAL_TABLET | ORAL | Status: DC | PRN
  Administered 2018-10-22: 1 via ORAL
  Filled 2018-10-19: qty 1

## 2018-10-19 MED ORDER — SODIUM CHLORIDE 0.9 % IV SOLN
INTRAVENOUS | Status: DC | PRN
  Administered 2018-10-19: 25 ug/min via INTRAVENOUS

## 2018-10-19 MED ORDER — SODIUM CHLORIDE 0.9% FLUSH
3.0000 mL | Freq: Two times a day (BID) | INTRAVENOUS | Status: DC
  Administered 2018-10-20 – 2018-10-22 (×4): 3 mL via INTRAVENOUS

## 2018-10-19 MED ORDER — PROPOFOL 10 MG/ML IV BOLUS
INTRAVENOUS | Status: DC | PRN
  Administered 2018-10-19: 120 mg via INTRAVENOUS

## 2018-10-19 MED ORDER — FENTANYL CITRATE (PF) 250 MCG/5ML IJ SOLN
INTRAMUSCULAR | Status: AC
  Filled 2018-10-19: qty 5

## 2018-10-19 MED ORDER — ACETAMINOPHEN 650 MG RE SUPP: 650.0000 mg | RECTAL | Status: DC | PRN

## 2018-10-19 MED ORDER — THROMBIN 20000 UNITS EX KIT
PACK | CUTANEOUS | Status: DC | PRN
  Administered 2018-10-19: 15:00:00 via TOPICAL

## 2018-10-19 MED ORDER — ROCURONIUM BROMIDE 100 MG/10ML IV SOLN
INTRAVENOUS | Status: DC | PRN
  Administered 2018-10-19: 5 mg via INTRAVENOUS
  Administered 2018-10-19 (×2): 20 mg via INTRAVENOUS
  Administered 2018-10-19: 50 mg via INTRAVENOUS
  Administered 2018-10-19: 25 mg via INTRAVENOUS
  Administered 2018-10-19: 30 mg via INTRAVENOUS

## 2018-10-19 MED ORDER — DEXAMETHASONE SODIUM PHOSPHATE 10 MG/ML IJ SOLN
INTRAMUSCULAR | Status: AC
  Filled 2018-10-19: qty 1

## 2018-10-19 MED ORDER — VANCOMYCIN HCL IN DEXTROSE 1-5 GM/200ML-% IV SOLN
INTRAVENOUS | Status: AC
  Filled 2018-10-19: qty 200

## 2018-10-19 MED ORDER — ROCURONIUM BROMIDE 50 MG/5ML IV SOSY
PREFILLED_SYRINGE | INTRAVENOUS | Status: AC
  Filled 2018-10-19: qty 5

## 2018-10-19 MED ORDER — HYDROMORPHONE HCL 1 MG/ML IJ SOLN: 1.0000 mg | INTRAMUSCULAR | Status: DC | PRN

## 2018-10-19 MED ORDER — FENTANYL CITRATE (PF) 100 MCG/2ML IJ SOLN
25.0000 ug | INTRAMUSCULAR | Status: DC | PRN
  Administered 2018-10-19: 25 ug via INTRAVENOUS

## 2018-10-19 MED ORDER — THROMBIN 5000 UNITS EX SOLR
CUTANEOUS | Status: AC
  Filled 2018-10-19: qty 5000

## 2018-10-19 MED ORDER — VANCOMYCIN HCL 1000 MG IV SOLR
INTRAVENOUS | Status: DC | PRN
  Administered 2018-10-19: 1000 mg via INTRAVENOUS

## 2018-10-19 MED ORDER — LIDOCAINE 2% (20 MG/ML) 5 ML SYRINGE
INTRAMUSCULAR | Status: AC
  Filled 2018-10-19: qty 5

## 2018-10-19 MED ORDER — 0.9 % SODIUM CHLORIDE (POUR BTL) OPTIME
TOPICAL | Status: DC | PRN
  Administered 2018-10-19: 1000 mL

## 2018-10-19 MED ORDER — PHENOL 1.4 % MT LIQD: 1.0000 | OROMUCOSAL | Status: DC | PRN

## 2018-10-19 MED ORDER — SODIUM CHLORIDE 0.9 % IV SOLN
INTRAVENOUS | Status: DC | PRN
  Administered 2018-10-19: 15:00:00

## 2018-10-19 MED ORDER — VANCOMYCIN HCL 1000 MG IV SOLR
INTRAVENOUS | Status: DC | PRN
  Administered 2018-10-19: 1000 mg via TOPICAL

## 2018-10-19 MED ORDER — VANCOMYCIN HCL 1000 MG IV SOLR
INTRAVENOUS | Status: AC
  Filled 2018-10-19: qty 1000

## 2018-10-19 MED ORDER — LACTATED RINGERS IV SOLN
INTRAVENOUS | Status: DC
  Administered 2018-10-19: 14:00:00 via INTRAVENOUS

## 2018-10-19 MED ORDER — SODIUM CHLORIDE 0.9 % IV SOLN
INTRAVENOUS | Status: DC | PRN
  Administered 2018-10-19 (×2): via INTRAVENOUS

## 2018-10-19 MED ORDER — VANCOMYCIN HCL IN DEXTROSE 1-5 GM/200ML-% IV SOLN
1000.0000 mg | INTRAVENOUS | Status: DC
  Administered 2018-10-20 – 2018-10-21 (×2): 1000 mg via INTRAVENOUS
  Filled 2018-10-19 (×3): qty 200

## 2018-10-19 MED ORDER — SUGAMMADEX SODIUM 200 MG/2ML IV SOLN
INTRAVENOUS | Status: DC | PRN
  Administered 2018-10-19: 150 mg via INTRAVENOUS

## 2018-10-19 MED ORDER — FLEET ENEMA 7-19 GM/118ML RE ENEM: 1.0000 | ENEMA | Freq: Once | RECTAL | Status: DC | PRN

## 2018-10-19 MED ORDER — THROMBIN (RECOMBINANT) 20000 UNITS EX SOLR
CUTANEOUS | Status: AC
  Filled 2018-10-19: qty 20000

## 2018-10-19 MED ORDER — PHENYLEPHRINE 40 MCG/ML (10ML) SYRINGE FOR IV PUSH (FOR BLOOD PRESSURE SUPPORT)
PREFILLED_SYRINGE | INTRAVENOUS | Status: DC | PRN
  Administered 2018-10-19: 80 ug via INTRAVENOUS

## 2018-10-19 MED ORDER — PROPOFOL 10 MG/ML IV BOLUS
INTRAVENOUS | Status: AC
  Filled 2018-10-19: qty 20

## 2018-10-19 MED ORDER — OXYCODONE HCL 5 MG PO TABS
10.0000 mg | ORAL_TABLET | ORAL | Status: DC | PRN
  Administered 2018-10-20 – 2018-10-21 (×3): 10 mg via ORAL
  Filled 2018-10-19 (×3): qty 2

## 2018-10-19 MED ORDER — ONDANSETRON HCL 4 MG/2ML IJ SOLN
INTRAMUSCULAR | Status: DC | PRN
  Administered 2018-10-19: 4 mg via INTRAVENOUS

## 2018-10-19 MED ORDER — DEXAMETHASONE SODIUM PHOSPHATE 10 MG/ML IJ SOLN
INTRAMUSCULAR | Status: DC | PRN
  Administered 2018-10-19: 10 mg via INTRAVENOUS

## 2018-10-19 MED ORDER — ONDANSETRON HCL 4 MG PO TABS: 4.0000 mg | ORAL_TABLET | Freq: Four times a day (QID) | ORAL | Status: DC | PRN

## 2018-10-19 MED ORDER — ACETAMINOPHEN 325 MG PO TABS: 650.0000 mg | ORAL_TABLET | ORAL | Status: DC | PRN

## 2018-10-19 MED ORDER — ONDANSETRON HCL 4 MG/2ML IJ SOLN
INTRAMUSCULAR | Status: AC
  Filled 2018-10-19: qty 2

## 2018-10-19 SURGICAL SUPPLY — 84 items
ADAPTER DRIVER T25 (MISCELLANEOUS) IMPLANT
ADH SKN CLS APL DERMABOND .7 (GAUZE/BANDAGES/DRESSINGS) ×1
APL SKNCLS STERI-STRIP NONHPOA (GAUZE/BANDAGES/DRESSINGS) ×1
BAG DECANTER FOR FLEXI CONT (MISCELLANEOUS) ×3 IMPLANT
BANDAGE ADH SHEER 1  50/CT (GAUZE/BANDAGES/DRESSINGS) ×2 IMPLANT
BENZOIN TINCTURE PRP APPL 2/3 (GAUZE/BANDAGES/DRESSINGS) ×3 IMPLANT
BLADE CLIPPER SURG (BLADE) IMPLANT
BLADE SURG 15 STRL LF DISP TIS (BLADE) ×1 IMPLANT
BLADE SURG 15 STRL SS (BLADE) ×3
BUR CUTTER 7.0 ROUND (BURR) ×3 IMPLANT
BUR MATCHSTICK NEURO 3.0 LAGG (BURR) ×3 IMPLANT
CANISTER SUCT 3000ML PPV (MISCELLANEOUS) ×3 IMPLANT
CAP LCK SPNE (Orthopedic Implant) ×4 IMPLANT
CAP LOCK SPINE RADIUS (Orthopedic Implant) IMPLANT
CAP LOCKING (Orthopedic Implant) ×12 IMPLANT
CARTRIDGE OIL MAESTRO DRILL (MISCELLANEOUS) ×2 IMPLANT
CEMENT KYPHON C01A KIT/MIXER (Cement) ×5 IMPLANT
CLOSURE WOUND 1/2 X4 (GAUZE/BANDAGES/DRESSINGS) ×1
CONT SPEC 4OZ CLIKSEAL STRL BL (MISCELLANEOUS) ×3 IMPLANT
COVER BACK TABLE 60X90IN (DRAPES) ×3 IMPLANT
COVER WAND RF STERILE (DRAPES) ×4 IMPLANT
DECANTER SPIKE VIAL GLASS SM (MISCELLANEOUS) ×3 IMPLANT
DERMABOND ADVANCED (GAUZE/BANDAGES/DRESSINGS) ×2
DERMABOND ADVANCED .7 DNX12 (GAUZE/BANDAGES/DRESSINGS) ×1 IMPLANT
DEVICE BIOPSY BONE KYPHX (INSTRUMENTS) ×1 IMPLANT
DEVICE BONE FILLER KYPHON SZ3 (MISCELLANEOUS) IMPLANT
DIFFUSER DRILL AIR PNEUMATIC (MISCELLANEOUS) ×4 IMPLANT
DRAPE C-ARM 42X72 X-RAY (DRAPES) ×7 IMPLANT
DRAPE C-ARMOR (DRAPES) ×2 IMPLANT
DRAPE HALF SHEET 40X57 (DRAPES) ×3 IMPLANT
DRAPE INCISE IOBAN 66X45 STRL (DRAPES) ×3 IMPLANT
DRAPE LAPAROTOMY 100X72X124 (DRAPES) ×6 IMPLANT
DRAPE POUCH INSTRU U-SHP 10X18 (DRAPES) ×1 IMPLANT
DRAPE SURG 17X23 STRL (DRAPES) ×12 IMPLANT
DRIVER ADAPTER T25 (MISCELLANEOUS) ×18
DRSG OPSITE POSTOP 4X10 (GAUZE/BANDAGES/DRESSINGS) ×2 IMPLANT
DURAPREP 26ML APPLICATOR (WOUND CARE) ×4 IMPLANT
ELECT REM PT RETURN 9FT ADLT (ELECTROSURGICAL) ×3
ELECTRODE REM PT RTRN 9FT ADLT (ELECTROSURGICAL) ×1 IMPLANT
EVACUATOR 1/8 PVC DRAIN (DRAIN) ×3 IMPLANT
GAUZE 4X4 16PLY RFD (DISPOSABLE) ×3 IMPLANT
GAUZE SPONGE 4X4 12PLY STRL (GAUZE/BANDAGES/DRESSINGS) ×3 IMPLANT
GLOVE ECLIPSE 9.0 STRL (GLOVE) ×7 IMPLANT
GLOVE EXAM NITRILE XL STR (GLOVE) IMPLANT
GOWN STRL REUS W/ TWL LRG LVL3 (GOWN DISPOSABLE) ×1 IMPLANT
GOWN STRL REUS W/ TWL XL LVL3 (GOWN DISPOSABLE) ×2 IMPLANT
GOWN STRL REUS W/TWL 2XL LVL3 (GOWN DISPOSABLE) IMPLANT
GOWN STRL REUS W/TWL LRG LVL3 (GOWN DISPOSABLE) ×3
GOWN STRL REUS W/TWL XL LVL3 (GOWN DISPOSABLE) ×6
GRAFT BN 10X1XDBM MAGNIFUSE (Bone Implant) IMPLANT
GRAFT BONE MAGNIFUSE 1X10CM (Bone Implant) ×3 IMPLANT
KIT BASIN OR (CUSTOM PROCEDURE TRAY) ×6 IMPLANT
KIT TURNOVER KIT B (KITS) ×4 IMPLANT
KYPHON BFD (MISCELLANEOUS) ×12
NDL HYPO 25X1 1.5 SAFETY (NEEDLE) ×1 IMPLANT
NEEDLE HYPO 22GX1.5 SAFETY (NEEDLE) ×3 IMPLANT
NEEDLE HYPO 25X1 1.5 SAFETY (NEEDLE) ×3 IMPLANT
NS IRRIG 1000ML POUR BTL (IV SOLUTION) ×6 IMPLANT
OIL CARTRIDGE MAESTRO DRILL (MISCELLANEOUS) ×3
PACK EENT II TURBAN DRAPE (CUSTOM PROCEDURE TRAY) ×3 IMPLANT
PACK LAMINECTOMY NEURO (CUSTOM PROCEDURE TRAY) ×3 IMPLANT
PAD ARMBOARD 7.5X6 YLW CONV (MISCELLANEOUS) ×7 IMPLANT
PLATE CROSSLINK 5.5X39-45MM SP (Plate) ×2 IMPLANT
RASP 3.0MM (RASP) ×2 IMPLANT
ROD PED SOLERA STRT 5.5X500 (Rod) ×2 IMPLANT
SCREW SET SOLERA (Screw) ×18 IMPLANT
SCREW SET SOLERA TI5.5 (Screw) IMPLANT
SCREW SOLERA 45X5.5XMA (Screw) IMPLANT
SCREW SOLERA 5.5X45MM (Screw) ×18 IMPLANT
SPONGE SURGIFOAM ABS GEL 100 (HEMOSTASIS) ×3 IMPLANT
STAPLER SKIN PROX WIDE 3.9 (STAPLE) ×3 IMPLANT
STRIP CLOSURE SKIN 1/2X4 (GAUZE/BANDAGES/DRESSINGS) ×3 IMPLANT
SUT VIC AB 0 CT1 18XCR BRD8 (SUTURE) ×2 IMPLANT
SUT VIC AB 0 CT1 8-18 (SUTURE) ×9
SUT VIC AB 2-0 CP2 18 (SUTURE) IMPLANT
SUT VIC AB 2-0 CT1 18 (SUTURE) ×5 IMPLANT
SUT VIC AB 3-0 SH 8-18 (SUTURE) ×6 IMPLANT
SUT VIC AB 4-0 PS2 27 (SUTURE) ×2 IMPLANT
TOWEL GREEN STERILE (TOWEL DISPOSABLE) ×4 IMPLANT
TOWEL GREEN STERILE FF (TOWEL DISPOSABLE) ×4 IMPLANT
TRAY FOLEY MTR SLVR 16FR STAT (SET/KITS/TRAYS/PACK) ×3 IMPLANT
TRAY KYPHOPAK 15/3 ONESTEP 1ST (MISCELLANEOUS) ×2 IMPLANT
TRAY KYPHOPAK 20/3 ONESTEP 1ST (MISCELLANEOUS) IMPLANT
WATER STERILE IRR 1000ML POUR (IV SOLUTION) ×3 IMPLANT

## 2018-10-19 NOTE — Anesthesia Procedure Notes (Signed)
Procedure Name: Intubation Date/Time: 10/19/2018 2:24 PM Performed by: Raenette Rover, CRNA Pre-anesthesia Checklist: Patient identified, Emergency Drugs available, Suction available and Patient being monitored Patient Re-evaluated:Patient Re-evaluated prior to induction Oxygen Delivery Method: Circle system utilized Preoxygenation: Pre-oxygenation with 100% oxygen Induction Type: IV induction Ventilation: Mask ventilation without difficulty Laryngoscope Size: Mac and 3 Grade View: Grade I Tube type: Oral Tube size: 7.0 mm Number of attempts: 1 Airway Equipment and Method: Stylet Placement Confirmation: ETT inserted through vocal cords under direct vision,  positive ETCO2,  CO2 detector and breath sounds checked- equal and bilateral Secured at: 21 cm Tube secured with: Tape Dental Injury: Teeth and Oropharynx as per pre-operative assessment

## 2018-10-19 NOTE — Anesthesia Preprocedure Evaluation (Addendum)

## 2018-10-19 NOTE — Plan of Care (Signed)
  Problem: Pain Managment: Goal: General experience of comfort will improve Outcome: Progressing   

## 2018-10-19 NOTE — Brief Op Note (Signed)
10/12/2018 - 10/19/2018  6:15 PM  PATIENT:  Weldon Picking  84 y.o. female  PRE-OPERATIVE DIAGNOSIS:  Thoracic fracture - stenosis  POST-OPERATIVE DIAGNOSIS:  Thoracic fracture - stenosis  PROCEDURE:  Procedure(s): Thoracic ten-Lumbar five posterior lateral fusion with pedicle screws, Local autograft and allograft, Thoracic eleven and Thoracic twelve laminectomy with transpedicular fracture reduction (N/A) Thoracic nine prophylactic vertebroplasty (N/A)  SURGEON:  Surgeon(s) and Role:    Earnie Larsson, MD - Primary  PHYSICIAN ASSISTANT:   ASSISTANTS: bergman, NP   ANESTHESIA:   general  EBL:  300 mL   BLOOD ADMINISTERED:none  DRAINS: (med) Hemovact drain(s) in the epidural space with  Suction Open   LOCAL MEDICATIONS USED:  NONE  SPECIMEN:  No Specimen  DISPOSITION OF SPECIMEN:  N/A  COUNTS:  YES  TOURNIQUET:  * No tourniquets in log *  DICTATION: .Dragon Dictation  PLAN OF CARE: Admit to inpatient   PATIENT DISPOSITION:  PACU - hemodynamically stable.   Delay start of Pharmacological VTE agent (>24hrs) due to surgical blood loss or risk of bleeding: yes

## 2018-10-19 NOTE — Progress Notes (Signed)
CSW spoke with [facility] who will be holding bed for patient and they are starting insurance auth with [facility] today.   CSW will continue to follow up.   [place], [place]

## 2018-10-19 NOTE — Anesthesia Postprocedure Evaluation (Signed)
Anesthesia Post Note  Patient: Laurie Galloway  Procedure(s) Performed: Thoracic ten-Lumbar five posterior lateral fusion with pedicle screws, Local autograft and allograft, Thoracic eleven and Thoracic twelve laminectomy with transpedicular fracture reduction (N/A Back) Thoracic nine prophylactic vertebroplasty (N/A Back)     Patient location during evaluation: PACU Anesthesia Type: General Level of consciousness: awake and alert Pain management: pain level controlled Vital Signs Assessment: post-procedure vital signs reviewed and stable Respiratory status: spontaneous breathing, nonlabored ventilation, respiratory function stable and patient connected to nasal cannula oxygen Cardiovascular status: blood pressure returned to baseline and stable Postop Assessment: no apparent nausea or vomiting Anesthetic complications: no    Last Vitals:  Vitals:   10/19/18 1900 10/19/18 1915  BP: (!) 156/75 (!) 162/75  Pulse: 95 91  Resp: (!) 23 18  Temp:    SpO2: 92% 97%    Last Pain:  Vitals:   10/19/18 1900  TempSrc:   PainSc: Asleep                 Bentleigh Waren,W. EDMOND

## 2018-10-19 NOTE — Op Note (Signed)
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  Asst. Surgeon: Reinaldo Meeker, NP  Anesthesia: General  Indication: 84 year old female status post prior L3-L5 decompression and fusion surgery presents with severe back pain.  Work-up demonstrates evidence of a T12 osteoporotic burst fracture with significant retropulsed bone into the final canal on the left at T12 with severe stenosis.  Patient with intractable back pain and bilateral lower extremity symptoms.  She is not responded to bracing or bedrest.  She presents now for operative decompression and fusion surgery in hopes of improving her symptoms.  The patient has very severe osteoporosis and certainly is high risk of fusion failure.  Operative note: After induction anesthesia, patient position prone onto bolsters and appropriately padded.  Thoracic and lumbar region prepped and draped sterilely.  Incision made from T10-L5.  Dissection performed bilaterally.  Retractor placed.  Fluoroscopy used.  Levels confirmed.  Decompressive laminectomy was then performed at T11-T12 and the superior aspect of L1.  Ligament flavum elevated and resected.  Medial facetectomy was performed bilaterally.  Inferior facet of T12 was completely resected on the left side.  The pedicle was partially resected on the left side at T12 as well.  Working along the lateral aspect of the thecal sac the thecal sac was  protected and I was able to use a curette to push the retropulsed bone forward decompressing the spinal canal.  At this point a very thorough decompression of been achieved.  There was no evidence of injury to the thecal sac or nerve roots.  Pedicles at T10-T11 and L1 were identified using surface landmarks.  These were confirmed by intraoperative fluoroscopy in both the AP and lateral plane.  Pilot holes were drilled.  Each pilot hole was then used to place and all through the pedicle into the vertebral body of T10-T11 and L1.  Good position was confirmed both AP and lateral planes.  The holes were probed and found to be solidly within the bone.  Each screw hole was then tapped.  Each screw hole was then probed and found to be solidly within bone.  5.64mm cannulated Medtronic screws were placed bilaterally at T10-T11 and L1.  Once again these were confirmed to be in good position both the AP and lateral planes.  Jamshidi needles were then passed into the vertebral body of T9 for prophylactic kyphoplasty above the level of her multi-level fusion.  This is been found to be effective in lessening the chance of proximal junctional kyphosis.  Once the Jamshidi needles were confirmed to be in good position in both AP and lateral planes methylmethacrylate was mixed.  The methylmethacrylate was then passed through the Jamshidi needles into the vertebral body of T9 with good intercalation of the vertebral body.  The methylmethacrylate was then passed through the screws at T10-T11 and L1 bilaterally again using live fluoroscopy in both AP and lateral planes.  After good augmentation of the screws the insertion devices were removed.  A segment of titanium rod was then contoured and placed over the screw heads from T10-L5.  Locking caps were then placed over the screws  were locking caps were then engaged.  Transverse connector was placed.  Final images reveal good position of the hardware and vertebral augmentations with  improved alignment of the spine.  Vancomycin powder was placed in deep wound space after morselized allograft and local autograft was packed along the posterior lamina facets and transverse processes bilaterally.  Wound was then closed in layers with Vicryl sutures.  Steri-Strips and sterile dressing were applied.  No apparent complications.  The patient tolerated the procedure well and she returns to the recovery room postop.

## 2018-10-19 NOTE — Plan of Care (Signed)
  Problem: Activity: Goal: Risk for activity intolerance will decrease Outcome: Progressing   Problem: Coping: Goal: Level of anxiety will decrease Outcome: Progressing   Problem: Elimination: Goal: Will not experience complications related to bowel motility Outcome: Progressing   Problem: Pain Managment: Goal: General experience of comfort will improve Outcome: Progressing   Problem: Skin Integrity: Goal: Risk for impaired skin integrity will decrease Outcome: Progressing

## 2018-10-19 NOTE — Transfer of Care (Signed)
Immediate Anesthesia Transfer of Care Note  Patient: Laurie Galloway  Procedure(s) Performed: Thoracic ten-Lumbar five posterior lateral fusion with pedicle screws, Local autograft and allograft, Thoracic eleven and Thoracic twelve laminectomy with transpedicular fracture reduction (N/A Back) Thoracic nine prophylactic vertebroplasty (N/A Back)  Patient Location: PACU  Anesthesia Type:General  Level of Consciousness: awake and alert   Airway & Oxygen Therapy: Patient Spontanous Breathing and Patient connected to face mask oxygen  Post-op Assessment: Report given to RN and Post -op Vital signs reviewed and stable  Post vital signs: Reviewed and stable  Last Vitals:  Vitals Value Taken Time  BP 139/70 10/19/2018  7:44 PM  Temp    Pulse 91 10/19/2018  7:47 PM  Resp 15 10/19/2018  7:47 PM  SpO2 95 % 10/19/2018  7:47 PM  Vitals shown include unvalidated device data.  Last Pain:  Vitals:   10/19/18 1930  TempSrc:   PainSc: Asleep      Patients Stated Pain Goal: 4 (02/58/52 7782)  Complications: No apparent anesthesia complications

## 2018-10-19 NOTE — Progress Notes (Signed)
PROGRESS NOTE    Laurie Galloway  YBO:[id number] DOB: 01-Jan-1930 DOA: 10/12/2018 PCP: Mayra Neer, MD   Brief Narrative:  84 year old with past medical history relevant for osteoporosis, hypertension, chronic back pain admitted on 10/12/2018 with acute on chronic back pain and difficulty ambulating with evidence of acute T12 fracture and L1 transverse process fracture pending neurosurgical/Interventional radiology treatment with course complicated by hyponatremia.   Assessment & Plan:   Principal Problem:   Compression fracture of body of thoracic vertebra (HCC) Active Problems:   HTN (hypertension)   Back pain   Pathologic thoracic fracture, initial encounter   Hyponatremia   Aortic atherosclerosis (HCC)   #) Hyponatremia: Stable and not profoundly low.  Likely secondary to pain which is a potent releaser of SIADH. -Continue salt tablets 1 g 3 times daily with meals -Discontinue IV fluids -Repeat BMP tomorrow --No free water once patient is able to take p.o. again.  #) Acute T12/L1 fracture: Likely secondary mechanical fall. -Pending OR with Dr. Eddie Dibbles from neurosurgery -Continue pain control  #) Hypertension: -Hold losartan 100 mg every morning due to hyponatremia -Continue verapamil 360 mg every morning -Continue PRN oral medications including hydralazine and clonidine for systolic blood pressure greater than 180  #) Osteoporosis: -Hold denosumab every 6 months - Restart home vitamin D and calcium tablets  Fluids: Tolerating p.o. Elect lites: Monitor and supplement Nutrition: Regular diet  Prophylaxis: SCDs  Disposition: Pending neurosurgery  DNR      Consultants:   Neurosurgery Dr. Trenton Gammon  Procedures:   Scheduled for kyphoplasty 10/19/2018  Antimicrobials:   none   Subjective: Pain remains at a low level once taking pain medications.  No numbness or weakness of the lower extremities.  Denies any symptoms of cough, congestion, diarrhea,  nausea or vomiting.  Objective: Vitals:   10/18/18 1440 10/18/18 1935 10/19/18 0435 10/19/18 0949  BP: (!) 159/75 (!) 149/66 (!) 165/69 (!) 177/73  Pulse: 82 90 69 79  Resp: 18   17  Temp: 98.7 F (37.1 C) 98.4 F (36.9 C) 98.3 F (36.8 C) 98.1 F (36.7 C)  TempSrc: Oral Oral Oral Oral  SpO2: 96% 96% 96% 97%    Intake/Output Summary (Last 24 hours) at 10/19/2018 1130 Last data filed at 10/19/2018 1113 Gross per 24 hour  Intake -  Output 700 ml  Net -700 ml   There were no vitals filed for this visit.  Examination:  General exam: Appears calm and comfortable  Respiratory system: Clear to auscultation. Respiratory effort normal. Cardiovascular system: S1 & S2 heard, RRR. No JVD, murmurs, rubs, gallops or clicks. No pedal edema. Gastrointestinal system: Abdomen is nondistended, soft and nontender. No organomegaly or masses felt. Normal bowel sounds heard. Central nervous system: Alert and oriented. No focal neurological deficits. Extremities: Symmetric 5 x 5 power. Skin: No rashes, lesions or ulcers Psychiatry: Judgement and insight appear normal. Mood & affect appropriate.     Data Reviewed: I have personally reviewed following labs and imaging studies  CBC: Recent Labs  Lab 10/13/18 0548 10/14/18 0329  WBC 11.1* 10.7*  HGB 12.2 12.0  HCT 37.0 35.7*  MCV 95.6 95.2  PLT 291 277   Basic Metabolic Panel: Recent Labs  Lab 10/15/18 0928 10/16/18 0237 10/17/18 0426 10/18/18 0345 10/19/18 0322  NA 124* 125* 128* 129* 128*  K 3.8 4.3 4.3 4.0 4.2  CL 94* 97* 101 100 101  CO2 23 22 21* 22 24  GLUCOSE 136* 104* 92 90 98  BUN 11 12  14 15 14   CREATININE 0.56 0.67 0.59 0.51 0.61  CALCIUM 7.8* 7.8* 7.6* 7.8* 7.9*   GFR: Estimated Creatinine Clearance: 41.8 mL/min (by C-G formula based on SCr of 0.61 mg/dL). Thyroid Function Tests: Recent Labs    10/17/18 0426  TSH 2.196     Recent Results (from the past 240 hour(s))  Surgical pcr screen     Status: None     Collection Time: 10/15/18 10:45 AM  Result Value Ref Range Status   MRSA, PCR NEGATIVE NEGATIVE Final   Staphylococcus aureus NEGATIVE NEGATIVE Final    Comment: (NOTE) The Xpert SA Assay (FDA approved for NASAL specimens in patients 23 years of age and older), is one component of a comprehensive surveillance program. It is not intended to diagnose infection nor to guide or monitor treatment. Performed at [facility], [place] [address] [place], [place] [address]          Radiology Studies: No results found.      Scheduled Meds: . acetaminophen  1,000 mg Oral QID  . calcium-vitamin D  1 tablet Oral BID WC  . cholecalciferol  2,000 Units Oral Daily  . methocarbamol  500 mg Oral QID  . omega-3 acid ethyl esters  1 g Oral Daily  . senna  1 tablet Oral Daily  . sodium chloride  1 g Oral TID WC  . verapamil  360 mg Oral q morning - 10a   Continuous Infusions:   LOS: 5 days    Time spent:  35 minutes    Lady Deutscher, MD FACP [facility] Pager [phone]  If 7PM-7AM, please contact night-coverage [email or web address] Password [email or web address] 10/19/2018, 11:30 AM

## 2018-10-19 NOTE — Progress Notes (Signed)
Pharmacy Antibiotic Note  Laurie Galloway is a 84 y.o. female admitted on 10/12/2018 with worsening lower back pain found to have acute fracture of T12 and transverse process of L1.  She is s/p T11-T12 decompressive laminectomy with reduction of fracture and prophylactic vertebroplasty. Pharmacy has been consulted for vancomycin dosing in patient with a hemovact drain in place. Vancomycin 1000 mg IV given at 14:08. Renal function is at baseline.  Plan: Vancomycin 1000 mg IV every q24h hours.  Goal trough 10-15 mcg/mL.  Monitor renal function, clinical progress, and de-escalate as able     Temp (24hrs), Avg:97.9 F (36.6 C), Min:97.5 F (36.4 C), Max:98.3 F (36.8 C)  Recent Labs  Lab 10/13/18 0548 10/14/18 0329 10/15/18 0928 10/16/18 0237 10/17/18 0426 10/18/18 0345 10/19/18 0322  WBC 11.1* 10.7*  --   --   --   --   --   CREATININE 0.61 0.62 0.56 0.67 0.59 0.51 0.61    Estimated Creatinine Clearance: 41.8 mL/min (by C-G formula based on SCr of 0.61 mg/dL).    Allergies  Allergen Reactions  . Penicillins Hives and Rash    Many years ago Has patient had a PCN reaction causing immediate rash, facial/tongue/throat swelling, SOB or lightheadedness with hypotension: No Has patient had a PCN reaction causing severe rash involving mucus membranes or skin necrosis: No Has patient had a PCN reaction that required hospitalization: No Has patient had a PCN reaction occurring within the last 10 years: No If all of the above answers are "NO", then may proceed with Cephalosporin use.   . Sulfa Antibiotics Rash     Thank you for allowing pharmacy to be a part of this patient's care.  Renold Genta, PharmD, BCPS Clinical Pharmacist Clinical phone for 10/19/2018 until 10p is [phone] 10/19/2018 8:37 PM  **Pharmacist phone directory can now be found on [email or web address] listed under [place]**

## 2018-10-20 LAB — BASIC METABOLIC PANEL
Anion gap: 8 (ref 5–15)
BUN: 16 mg/dL (ref 8–23)
CO2: 24 mmol/L (ref 22–32)
Calcium: 7.8 mg/dL — ABNORMAL LOW (ref 8.9–10.3)
Chloride: 96 mmol/L — ABNORMAL LOW (ref 98–111)
Creatinine, Ser: 0.84 mg/dL (ref 0.44–1.00)
GFR calc Af Amer: >60 mL/min (ref >60)
GFR calc non Af Amer: >60 mL/min (ref >60)
Glucose, Bld: 128 mg/dL — ABNORMAL HIGH (ref 70–99)
Potassium: 4.4 mmol/L (ref 3.5–5.1)
Sodium: 128 mmol/L — ABNORMAL LOW (ref 135–145)

## 2018-10-20 LAB — CBC
HCT: 33.8 % — ABNORMAL LOW (ref 36.0–46.0)
HCT: 29.1 % — ABNORMAL LOW (ref 36.0–46.0)
Hemoglobin: 11.1 g/dL — ABNORMAL LOW (ref 12.0–15.0)
Hemoglobin: 9.3 g/dL — ABNORMAL LOW (ref 12.0–15.0)
MCH: 31.4 pg (ref 26.0–34.0)
MCH: 31 pg (ref 26.0–34.0)
MCHC: 32.8 g/dL (ref 30.0–36.0)
MCHC: 32 g/dL (ref 30.0–36.0)
MCV: 95.8 fL (ref 80.0–100.0)
MCV: 97 fL (ref 80.0–100.0)
Platelets: 291 10*3/uL (ref 150–400)
Platelets: 203 10*3/uL (ref 150–400)
RBC: 3.53 MIL/uL — ABNORMAL LOW (ref 3.87–5.11)
RBC: 3 MIL/uL — ABNORMAL LOW (ref 3.87–5.11)
RDW: 15.5 % (ref 11.5–15.5)
RDW: 15.7 % — ABNORMAL HIGH (ref 11.5–15.5)
WBC: 16.2 10*3/uL — ABNORMAL HIGH (ref 4.0–10.5)
WBC: 13.6 10*3/uL — ABNORMAL HIGH (ref 4.0–10.5)
nRBC: 0 % (ref 0.0–0.2)
nRBC: 0 % (ref 0.0–0.2)

## 2018-10-20 LAB — LACTIC ACID, PLASMA: Lactic Acid, Venous: 1.4 mmol/L (ref 0.5–1.9)

## 2018-10-20 MED ORDER — SODIUM CHLORIDE 0.9 % IV BOLUS
500.0000 mL | Freq: Once | INTRAVENOUS | Status: AC
  Administered 2018-10-20: 500 mL via INTRAVENOUS

## 2018-10-20 MED FILL — Thrombin (Recombinant) For Soln 20000 Unit: CUTANEOUS | Qty: 1 | Status: AC

## 2018-10-20 NOTE — Evaluation (Signed)
Occupational Therapy Evaluation Patient Details Name: Laurie Galloway MRN: [id number] DOB: 1930/03/03 Today's Date: 10/20/2018    History of Present Illness Pt is an 84 y/o female admitted secondary to worsening back pain. Pt with recent trip to ED and was found to have T12 compression fracture, and transverse process fracture at L1. Imaging also revelaed old compression fractures at L2-4. PMH includes HTN and lumbar surgery. Now, s/p Thoracic ten-Lumbar five posterior lateral fusion, T9 vertebroplasty   Clinical Impression   Pt admitted as above and now s/p thoracic ten-lumbar five posterior lateral fusion, T9 vertebroplasty on 10/19/18. Pt was re-evaluated by OT and goals have been updated. Recommend short term Rehab at [facility] w/ 24/7 assist. Overall Mod A +2 for transfers at this time. Will follow acutely.    Follow Up Recommendations  SNF;Supervision/Assistance - 24 hour    Equipment Recommendations  Other (comment)(Defer to next venue)    Recommendations for Other Services       Precautions / Restrictions Precautions Precautions: Back;Fall Precaution Comments: Reviewed back precautions with pt.  Required Braces or Orthoses: Spinal Brace Spinal Brace: Thoracolumbosacral orthotic      Mobility Bed Mobility Overal bed mobility: Needs Assistance Bed Mobility: Rolling;Sidelying to Sit Rolling: Mod assist Sidelying to sit: Min assist          Transfers Overall transfer level: Needs assistance Equipment used: Rolling walker (2 wheeled) Transfers: Sit to/from [facility] Sit to Stand: Mod assist;+2 physical assistance;+2 safety/equipment;From elevated surface Stand pivot transfers: Mod assist;+2 physical assistance;+2 safety/equipment       General transfer comment: Pt modAx2 for powerup into stance and for stability, Fatigues easily    Balance Overall balance assessment: Needs assistance Sitting-balance support: Bilateral upper extremity supported;Feet  supported;Single extremity supported Sitting balance-Leahy Scale: Fair     Standing balance support: Bilateral upper extremity supported;During functional activity Standing balance-Leahy Scale: Poor Standing balance comment: Heavy reliance on UE's/RW                           ADL either performed or assessed with clinical judgement   ADL Overall ADL's : Needs assistance/impaired Eating/Feeding: Set up;Sitting   Grooming: Set up;Sitting Grooming Details (indicate cue type and reason): +2 Assist to stand at this time, unable to tolerate/perform grooming in standing. Upper Body Bathing: Moderate assistance;Sitting   Lower Body Bathing: +2 for physical assistance;+2 for safety/equipment;Total assistance;Sit to/from stand   Upper Body Dressing : Sitting;Maximal assistance Upper Body Dressing Details (indicate cue type and reason): To don brace sitting up at EOB - education in tightening and strap placement Lower Body Dressing: Total assistance;+2 for physical assistance;+2 for safety/equipment;Sit to/from stand   Toilet Transfer: +2 for physical assistance;+2 for safety/equipment;Stand-pivot;BSC;RW;Moderate assistance(Simulated transfer from EOB to recliner chair +2 Mod Assist, fatigues easily) Toilet Transfer Details (indicate cue type and reason): Fatigues easily, pt states she hasn't eaten and also has not tolerated OOB in a few days Toileting- Clothing Manipulation and Hygiene: Maximal assistance;+2 for physical assistance;+2 for safety/equipment;Sit to/from stand Toileting - Clothing Manipulation Details (indicate cue type and reason): able to complete front peri care  sitting at EOB     Functional mobility during ADLs: Moderate assistance;+2 for physical assistance;+2 for safety/equipment;Rolling walker;Cueing for sequencing;Cueing for safety General ADL Comments: Pt was re-evaluated for OT following recent surgery 10/19/18. Pt able to tolerate OOB activities this date.  Goals have been updated and recommendation of short term Rehab at SNF is appropriate. Pt participated  in ADL retrainign session with focus on bed mobility, brace application, sitting EOB for LB ADL's, peri-care from front and standing with + 2 Assist for bathing LE's as pt catheter had leaked & gown was soiled. Assist to change gown in sitting as well. Will follow acutely for OT.     Vision Patient Visual Report: No change from baseline       Perception     Praxis      Pertinent Vitals/Pain Pain Assessment: Faces Faces Pain Scale: Hurts little more Pain Location: back and BLEs  Pain Descriptors / Indicators: Aching;Grimacing;Sharp Pain Intervention(s): Monitored during session;Premedicated before session;Repositioned     Hand Dominance Right   Extremity/Trunk Assessment Upper Extremity Assessment Upper Extremity Assessment: Generalized weakness   Lower Extremity Assessment Lower Extremity Assessment: Defer to PT evaluation RLE Deficits / Details: Pt did not report shooting pain this session; Noting weakness and fatigue bil LEs LLE Deficits / Details: Pt did not report shooting pain this session; Noting weakness and fatigue bil LEs   Cervical / Trunk Assessment Cervical / Trunk Assessment: Kyphotic;Other exceptions Cervical / Trunk Exceptions: compression fractures    Communication Communication Communication: No difficulties   Cognition Arousal/Alertness: Awake/alert Behavior During Therapy: WFL for tasks assessed/performed Overall Cognitive Status: Within Functional Limits for tasks assessed                                     General Comments  Pt completed bed mobility & transfer with +2 Min-Mod A this date adhering to back precautions. PT educated pt on proper application of back brace. Discussed OT/PT recommendations for short term Rehab at SNF and pt verbalized agreement with this.    Exercises     Shoulder Instructions      Home Living  Family/patient expects to be discharged to:: Private residence Living Arrangements: Children Available Help at Discharge: Family;Available PRN/intermittently Type of Home: House Home Access: Ramped entrance     Home Layout: One level     Bathroom Shower/Tub: Teacher, early years/pre: Handicapped height     Home Equipment: Tub bench;Walker - 4 wheels;Bedside commode;Wheelchair - manual   Additional Comments: Pt's special needs daughter lives with pt, however, unable to physically assist.       Prior Functioning/Environment Level of Independence: Needs assistance  Gait / Transfers Assistance Needed: Since having increased pain, has had difficulty ambulating and has required assist with use of walker. Prior to the last 3 weeks, pt was using rollator for ambulation.  ADL's / Homemaking Assistance Needed: Pt's daughter has been helping with ADL tasks.             OT Problem List: Decreased range of motion;Decreased activity tolerance;Impaired balance (sitting and/or standing);Decreased knowledge of use of DME or AE;Decreased knowledge of precautions;Pain      OT Treatment/Interventions: Self-care/ADL training;Therapeutic exercise;DME and/or AE instruction;Therapeutic activities;Patient/family education;Balance training    OT Goals(Current goals can be found in the care plan section) Acute Rehab OT Goals Patient Stated Goal: Decrease pain. SNF then home OT Goal Formulation: With patient Time For Goal Achievement: 11/03/18 Potential to Achieve Goals: Good  OT Frequency: Min 2X/week   Barriers to D/C:            Co-evaluation PT/OT/SLP Co-Evaluation/Treatment: Yes Reason for Co-Treatment: For patient/therapist safety;To address functional/ADL transfers(activity tolerance) PT goals addressed during session: Mobility/safety with mobility OT goals addressed during session: ADL's and self-care  AM-PAC PT "6 Clicks" Daily Activity     Outcome Measure Help from  another person eating meals?: None Help from another person taking care of personal grooming?: None(in sitting) Help from another person toileting, which includes using toliet, bedpan, or urinal?: A Lot Help from another person bathing (including washing, rinsing, drying)?: A Lot Help from another person to put on and taking off regular upper body clothing?: A Lot Help from another person to put on and taking off regular lower body clothing?: Total 6 Click Score: 15   End of Session Equipment Utilized During Treatment: Gait belt;Rolling walker;Back brace Nurse Communication: Mobility status;Precautions  Activity Tolerance: Patient tolerated treatment well;Patient limited by fatigue Patient left: in chair;with call bell/phone within reach;with chair alarm set  OT Visit Diagnosis: Unsteadiness on feet (R26.81);Other abnormalities of gait and mobility (R26.89);Muscle weakness (generalized) (M62.81);History of falling (Z91.81);Pain Pain - Right/Left: (Bilateral) Pain - part of body: (Back/leg)                Time: 2263-3354 OT Time Calculation (min): 37 min Charges:  OT General Charges $OT Visit: 1 Visit OT Evaluation $OT Eval Moderate Complexity: 1 Mod OT Treatments $Self Care/Home Management : 8-22 mins   [place], Amy Beth Dixon, OTR/L 10/20/2018, 11:47 AM

## 2018-10-20 NOTE — Progress Notes (Signed)
Post op day one.  Pain much better.  Up in chair. No LE pain. Numbness much better  Awake and aware O x 4  Speech fluent, Motor 5/5 Bilat. abd soft  Chest clear  Doing well post op. Mobilize

## 2018-10-20 NOTE — Evaluation (Signed)
Physical Therapy Re-Evaluation Patient Details Name: Laurie Galloway MRN: [id number] DOB: [birth year] Today's Date: 10/20/2018   History of Present Illness  Pt is an 84 y/o female admitted secondary to worsening back pain. Pt with recent trip to ED and was found to have T12 compression fracture, and transverse process fracture at L1. Imaging also revelaed old compression fractures at L2-4. PMH includes HTN and lumbar surgery. Now, s/p Thoracic ten-Lumbar five posterior lateral fusion, T9 vertebroplasty  Clinical Impression  Patient is s/p above surgery resulting in functional limitations due to the deficits listed below (see PT Problem List). Pain seems better postop; she continues to have functional mobility deficits, and we continue to recommend SNF for post-acute rehab;  Patient will benefit from skilled PT to increase their independence and safety with mobility to allow discharge to the venue listed below.       Follow Up Recommendations SNF;Supervision/Assistance - 24 hour    Equipment Recommendations  None recommended by PT    Recommendations for Other Services       Precautions / Restrictions Precautions Precautions: Back;Fall Precaution Comments: Reviewed back precautions with pt.  Required Braces or Orthoses: Spinal Brace Spinal Brace: Thoracolumbosacral orthotic      Mobility  Bed Mobility Overal bed mobility: Needs Assistance Bed Mobility: Rolling;Sidelying to Sit Rolling: Mod assist Sidelying to sit: Min assist       General bed mobility comments: Mod assist for initiating rolling (likely because she was anxious at anticipation of pain); min assist to elevate trunk to sit  Transfers Overall transfer level: Needs assistance Equipment used: Rolling walker (2 wheeled) Transfers: Sit to/from [facility] Sit to Stand: Mod assist;+2 physical assistance;+2 safety/equipment;From elevated surface Stand pivot transfers: Mod assist;+2 physical assistance;+2  safety/equipment       General transfer comment: Pt modAx2 for powerup into stance and for stability, Fatigues easily  Ambulation/Gait Ambulation/Gait assistance: Mod assist;+2 physical assistance Gait Distance (Feet): 5 Feet Assistive device: Rolling walker (2 wheeled) Gait Pattern/deviations: Decreased step length - right;Decreased step length - left     General Gait Details: Mod assist of 2 for safety as she tended to fatigue and sink into bilateral knee flexion; cues to use RW for better support  Stairs            Wheelchair Mobility    Modified Rankin (Stroke Patients Only)       Balance Overall balance assessment: Needs assistance Sitting-balance support: Bilateral upper extremity supported;Feet supported;Single extremity supported Sitting balance-Leahy Scale: Fair     Standing balance support: Bilateral upper extremity supported;During functional activity Standing balance-Leahy Scale: Poor Standing balance comment: Heavy reliance on UE's/RW                             Pertinent Vitals/Pain Pain Assessment: Faces Faces Pain Scale: Hurts little more Pain Location: back and BLEs  Pain Descriptors / Indicators: Aching;Grimacing;Sharp Pain Intervention(s): Monitored during session;Premedicated before session;Repositioned    Home Living Family/patient expects to be discharged to:: Private residence Living Arrangements: Children Available Help at Discharge: Family;Available PRN/intermittently Type of Home: House Home Access: Ramped entrance     Home Layout: One level Home Equipment: Tub bench;Walker - 4 wheels;Bedside commode;Wheelchair - manual Additional Comments: Pt's special needs daughter lives with pt, however, unable to physically assist.     Prior Function Level of Independence: Needs assistance   Gait / Transfers Assistance Needed: Since having increased pain, has had difficulty ambulating and  has required assist with use of walker.  Prior to the last 3 weeks, pt was using rollator for ambulation.   ADL's / Homemaking Assistance Needed: Pt's daughter has been helping with ADL tasks.         Hand Dominance   Dominant Hand: Right    Extremity/Trunk Assessment   Upper Extremity Assessment Upper Extremity Assessment: Generalized weakness    Lower Extremity Assessment Lower Extremity Assessment: Defer to PT evaluation RLE Deficits / Details: Pt did not report shooting pain this session; Noting weakness and fatigue bil LEs LLE Deficits / Details: Pt did not report shooting pain this session; Noting weakness and fatigue bil LEs    Cervical / Trunk Assessment Cervical / Trunk Assessment: Kyphotic;Other exceptions Cervical / Trunk Exceptions: compression fractures   Communication   Communication: No difficulties  Cognition Arousal/Alertness: Awake/alert Behavior During Therapy: WFL for tasks assessed/performed Overall Cognitive Status: Within Functional Limits for tasks assessed                                        General Comments General comments (skin integrity, edema, etc.): Pt completed bed mobility & transfer with +2 Min-Mod A this date adhering to back precautions. PT educated pt on proper application of back brace. Discussed OT/PT recommendations for short term Rehab at SNF and pt verbalized agreement with this.    Exercises     Assessment/Plan    PT Assessment Patient needs continued PT services  PT Problem List Decreased strength;Decreased balance;Decreased activity tolerance;Decreased mobility;Decreased knowledge of use of DME;Decreased knowledge of precautions;Pain       PT Treatment Interventions Gait training;DME instruction;Functional mobility training;Therapeutic activities;Therapeutic exercise;Balance training;Patient/family education    PT Goals (Current goals can be found in the Care Plan section)  Acute Rehab PT Goals Patient Stated Goal: Decrease pain. SNF then  home PT Goal Formulation: With patient(Goals set 11/12 continue to be appropriate) Time For Goal Achievement: 11/03/18 Potential to Achieve Goals: Fair    Frequency Min 2X/week   Barriers to discharge        Co-evaluation PT/OT/SLP Co-Evaluation/Treatment: Yes Reason for Co-Treatment: For patient/therapist safety;To address functional/ADL transfers(activity tolerance) PT goals addressed during session: Mobility/safety with mobility OT goals addressed during session: ADL's and self-care       AM-PAC PT "6 Clicks" Daily Activity  Outcome Measure Difficulty turning over in bed (including adjusting bedclothes, sheets and blankets)?: A Lot Difficulty moving from lying on back to sitting on the side of the bed? : Unable Difficulty sitting down on and standing up from a chair with arms (e.g., wheelchair, bedside commode, etc,.)?: Unable Help needed moving to and from a bed to chair (including a wheelchair)?: A Lot Help needed walking in hospital room?: A Lot Help needed climbing 3-5 steps with a railing? : Total 6 Click Score: 9    End of Session Equipment Utilized During Treatment: Gait belt Activity Tolerance: Patient tolerated treatment well Patient left: in chair;with call bell/phone within reach;with chair alarm set;with nursing/sitter in room Nurse Communication: Mobility status PT Visit Diagnosis: Unsteadiness on feet (R26.81);Repeated falls (R29.6);Muscle weakness (generalized) (M62.81);History of falling (Z91.81);Pain Pain - part of body: (Back)    Time: 9811-9147 PT Time Calculation (min) (ACUTE ONLY): 37 min   Charges:   PT Evaluation $PT Re-evaluation: 1 Re-eval          Roney Marion, [place] Pager [phone] Office  [phone]   Laurie Galloway 10/20/2018, 1:37 PM

## 2018-10-20 NOTE — Progress Notes (Signed)
PROGRESS NOTE    Laurie Galloway  XNA:[id number] DOB: 1930-09-09 DOA: 10/12/2018 PCP: Mayra Neer, MD   Brief Narrative:  84 year old with past medical history relevant for osteoporosis, hypertension, chronic back pain admitted on 10/12/2018 with acute on chronic back pain and difficulty ambulating with evidence of acute T12 fracture and L1 transverse process fracture pending neurosurgical/Interventional radiology treatment with course complicated by hyponatremia.   Assessment & Plan:   Principal Problem:   Compression fracture of body of thoracic vertebra (HCC) Active Problems:   HTN (hypertension)   Back pain   Pathologic thoracic fracture, initial encounter   Hyponatremia   Aortic atherosclerosis (HCC)    #) Hyponatremia: Stable and not profoundly low. Likely secondary to pain which is a potent releaser of SIADH. -Continue salt tablets 1 g 3 times daily with meals -Discontinue IV fluids -Repeat BMP tomorrow --No free water/ice   #) Acute T12/L1 fracture: Likely secondary mechanical fall. -Pending OR with Dr. Eddie Dibbles from neurosurgery -Continue pain control  #) Hypertension: -Hold losartan 100 mg every morning due to hyponatremia -Continue verapamil 360 mg every morning -Continue PRN oral medications including hydralazine and clonidine for systolic blood pressure greater than 180  #) Osteoporosis: -Hold denosumab every 6 months - Restart home vitamin D and calcium tablets  Fluids: Tolerating p.o. Elect lites: Monitor and supplement Nutrition: Regular diet  Prophylaxis: SCDs  Disposition: Pending neurosurgery  DNR     Subjective: Patient with no new complaints.  Continues to progress postoperatively.  Objective: Vitals:   10/20/18 0036 10/20/18 0446 10/20/18 1051 10/20/18 1134  BP: (!) 159/72 (!) 144/64 (!) 95/58 107/60  Pulse: 90 96 99 (!) 105  Resp:  15 16   Temp: 97.7 F (36.5 C)  98.1 F (36.7 C)   TempSrc: Oral  Oral   SpO2: 96% 96%  95%     Intake/Output Summary (Last 24 hours) at 10/20/2018 1223 Last data filed at 10/20/2018 0754 Gross per 24 hour  Intake 1200 ml  Output 2400 ml  Net -1200 ml   There were no vitals filed for this visit.  Examination:  General exam: Appears calm and comfortable  Respiratory system: Clear to auscultation. Respiratory effort normal. Cardiovascular system: S1 & S2 heard, RRR. No JVD, murmurs, rubs, gallops or clicks. No pedal edema. Gastrointestinal system: Abdomen is nondistended, soft and nontender. No organomegaly or masses felt. Normal bowel sounds heard. Central nervous system: Alert and oriented. No focal neurological deficits. Extremities: Symmetric 5 x 5 power. Skin: No rashes, lesions or ulcers Psychiatry: Judgement and insight appear normal. Mood & affect appropriate.     Data Reviewed: I have personally reviewed following labs and imaging studies  CBC: Recent Labs  Lab 10/14/18 0329  WBC 10.7*  HGB 12.0  HCT 35.7*  MCV 95.2  PLT 542   Basic Metabolic Panel: Recent Labs  Lab 10/15/18 0928 10/16/18 0237 10/17/18 0426 10/18/18 0345 10/19/18 0322  NA 124* 125* 128* 129* 128*  K 3.8 4.3 4.3 4.0 4.2  CL 94* 97* 101 100 101  CO2 23 22 21* 22 24  GLUCOSE 136* 104* 92 90 98  BUN 11 12 14 15 14   CREATININE 0.56 0.67 0.59 0.51 0.61  CALCIUM 7.8* 7.8* 7.6* 7.8* 7.9*   GFR: Estimated Creatinine Clearance: 41.8 mL/min (by C-G formula based on SCr of 0.61 mg/dL). Liver Function Tests: No results for input(s): AST, ALT, ALKPHOS, BILITOT, PROT, ALBUMIN in the last 168 hours. No results for input(s): LIPASE, AMYLASE in the last  168 hours. No results for input(s): AMMONIA in the last 168 hours. Coagulation Profile: No results for input(s): INR, PROTIME in the last 168 hours. Cardiac Enzymes: No results for input(s): CKTOTAL, CKMB, CKMBINDEX, TROPONINI in the last 168 hours. BNP (last 3 results) No results for input(s): PROBNP in the last 8760  hours. HbA1C: No results for input(s): HGBA1C in the last 72 hours. CBG: No results for input(s): GLUCAP in the last 168 hours. Lipid Profile: No results for input(s): CHOL, HDL, LDLCALC, TRIG, CHOLHDL, LDLDIRECT in the last 72 hours. Thyroid Function Tests: No results for input(s): TSH, T4TOTAL, FREET4, T3FREE, THYROIDAB in the last 72 hours. Anemia Panel: No results for input(s): VITAMINB12, FOLATE, FERRITIN, TIBC, IRON, RETICCTPCT in the last 72 hours. Sepsis Labs: No results for input(s): PROCALCITON, LATICACIDVEN in the last 168 hours.  Recent Results (from the past 240 hour(s))  Surgical pcr screen     Status: None   Collection Time: 10/15/18 10:45 AM  Result Value Ref Range Status   MRSA, PCR NEGATIVE NEGATIVE Final   Staphylococcus aureus NEGATIVE NEGATIVE Final    Comment: (NOTE) The Xpert SA Assay (FDA approved for NASAL specimens in patients 47 years of age and older), is one component of a comprehensive surveillance program. It is not intended to diagnose infection nor to guide or monitor treatment. Performed at [facility], [place] [address], [place], [place] [address]          Radiology Studies: Dg Lumbar Spine Complete  Result Date: 10/19/2018 CLINICAL DATA:  Intraoperative localization for T10 through L5 fusion with T9 vertebroplasty. EXAM: DG C-ARM 61-120 MIN; LUMBAR SPINE - COMPLETE 4+ VIEW COMPARISON:  Prior CT from 10/13/2018. FINDINGS: Multiple spot intraoperative fluoroscopic PA and lateral views of the thoracolumbar spine demonstrate posterior fusion at T10 through L5 hardware appears grossly well position without adverse features. Vertebral augmentation at T9. IMPRESSION: Intraoperative fluoroscopic localization for T10-L5 fusion with T9 vertebral augmentation. Electronically Signed   By: Jeannine Boga M.D.   On: 10/19/2018 20:36   Dg C-arm 1-60 Min  Result Date: 10/19/2018 CLINICAL DATA:  Intraoperative localization for T10 through L5  fusion with T9 vertebroplasty. EXAM: DG C-ARM 61-120 MIN; LUMBAR SPINE - COMPLETE 4+ VIEW COMPARISON:  Prior CT from 10/13/2018. FINDINGS: Multiple spot intraoperative fluoroscopic PA and lateral views of the thoracolumbar spine demonstrate posterior fusion at T10 through L5 hardware appears grossly well position without adverse features. Vertebral augmentation at T9. IMPRESSION: Intraoperative fluoroscopic localization for T10-L5 fusion with T9 vertebral augmentation. Electronically Signed   By: Jeannine Boga M.D.   On: 10/19/2018 20:36   Dg C-arm 1-60 Min  Result Date: 10/19/2018 CLINICAL DATA:  Intraoperative localization for T10 through L5 fusion with T9 vertebroplasty. EXAM: DG C-ARM 61-120 MIN; LUMBAR SPINE - COMPLETE 4+ VIEW COMPARISON:  Prior CT from 10/13/2018. FINDINGS: Multiple spot intraoperative fluoroscopic PA and lateral views of the thoracolumbar spine demonstrate posterior fusion at T10 through L5 hardware appears grossly well position without adverse features. Vertebral augmentation at T9. IMPRESSION: Intraoperative fluoroscopic localization for T10-L5 fusion with T9 vertebral augmentation. Electronically Signed   By: Jeannine Boga M.D.   On: 10/19/2018 20:36        Scheduled Meds: . acetaminophen  1,000 mg Oral QID  . calcium-vitamin D  1 tablet Oral BID WC  . cholecalciferol  2,000 Units Oral Daily  . methocarbamol  500 mg Oral QID  . omega-3 acid ethyl esters  1 g Oral Daily  . senna  1 tablet Oral Daily  . sodium chloride flush  3 mL Intravenous Q12H  . sodium chloride  1 g Oral TID WC  . verapamil  360 mg Oral q morning - 10a   Continuous Infusions: . sodium chloride 250 mL (10/19/18 2050)  . lactated ringers 10 mL/hr at 10/19/18 1352  . vancomycin       LOS: 6 days    Time spent: [place], MD [facility] Pager [phone]  If 7PM-7AM, please contact night-coverage [email or web address] Password [facility] 10/20/2018, 12:23  PM

## 2018-10-20 NOTE — Progress Notes (Signed)
Orthopedic Tech Progress Note Patient Details:  Laurie Galloway Apr 04, 1930 [id number]  Patient ID: Laurie Galloway, female   DOB: 02-Aug-1930, 84 y.o.   MRN: [id number] I talked to rn. She stated that pt has already got their brace.  Karolee Stamps 10/20/2018, 9:10 AM

## 2018-10-20 NOTE — Plan of Care (Signed)
Problem: Education: Goal: Knowledge of General Education information will improve Description Including pain rating scale, medication(s)/side effects and non-pharmacologic comfort measures Outcome: Progressing   Problem: Health Behavior/Discharge Planning: Goal: Ability to manage health-related needs will improve Outcome: Progressing   Problem: Clinical Measurements: Goal: Respiratory complications will improve Outcome: Progressing   Problem: Activity: Goal: Risk for activity intolerance will decrease Outcome: Progressing   Problem: Nutrition: Goal: Adequate nutrition will be maintained Outcome: Progressing   Problem: Coping: Goal: Level of anxiety will decrease Outcome: Progressing   Problem: Elimination: Goal: Will not experience complications related to urinary retention Outcome: Progressing   Problem: Pain Managment: Goal: General experience of comfort will improve Outcome: Progressing   Problem: Skin Integrity: Goal: Risk for impaired skin integrity will decrease Outcome: Progressing

## 2018-10-20 NOTE — Progress Notes (Addendum)
[id number] Gula, MD paged regarding pt's BP 81/50. Awaiting MD response. Will continue to monitor.  1920Hal Hope, MD to order NS bolus. Will continue to monitor.

## 2018-10-21 ENCOUNTER — Encounter (HOSPITAL_COMMUNITY): Payer: Self-pay | Admitting: Neurosurgery

## 2018-10-21 DIAGNOSIS — E871 Hypo-osmolality and hyponatremia: Secondary | ICD-10-CM

## 2018-10-21 DIAGNOSIS — D72829 Elevated white blood cell count, unspecified: Secondary | ICD-10-CM

## 2018-10-21 DIAGNOSIS — S22000A Wedge compression fracture of unspecified thoracic vertebra, initial encounter for closed fracture: Secondary | ICD-10-CM

## 2018-10-21 DIAGNOSIS — S22080A Wedge compression fracture of T11-T12 vertebra, initial encounter for closed fracture: Secondary | ICD-10-CM

## 2018-10-21 DIAGNOSIS — G8918 Other acute postprocedural pain: Secondary | ICD-10-CM

## 2018-10-21 DIAGNOSIS — M545 Low back pain: Secondary | ICD-10-CM

## 2018-10-21 DIAGNOSIS — I10 Essential (primary) hypertension: Secondary | ICD-10-CM

## 2018-10-21 DIAGNOSIS — D62 Acute posthemorrhagic anemia: Secondary | ICD-10-CM

## 2018-10-21 NOTE — Progress Notes (Signed)
Postop day 2.  Patient's pain well controlled.  No lower extremity pain.  Able to be up in chair and walk a little bit around the room yesterday with minimal assistance.  Some transient hypotension yesterday now resolved.  Urine output marginal but acceptable.  Drain output low.  Patient is awake and alert.  She is oriented and appropriate.  Her abdomen is soft.  Her chest is clear.  Her motor and sensory function are intact.  Overall patient progressing well following thoracic and lumbar fusion.  I would like to have rehab medicine evaluate her for possible inpatient rehabilitation versus discharge to skilled nursing with ongoing physical therapy.

## 2018-10-21 NOTE — Progress Notes (Addendum)
Pt not voided since foley removal this am. Bladder scan 216 mL. Pt asymptomatic at this time. Denton Brick, MD paged. Awaiting MD response. Will continue to monitor.

## 2018-10-21 NOTE — Consult Note (Signed)
Physical Medicine and Rehabilitation Consult Reason for Consult: decreased functional mobility Referring Physician: [facility]   HPI: Laurie Galloway is a 84 y.o.right handed female with history of hypertension,prior L3- L5 decompression and fusion surgery 8 years ago.presented 10/13/2018 with progressive low back pain as well as a recent fall. Per chart review and patient, patient lives with her special needs daughter. One level home with ramped entrance. She has recently been using a walker. Her daughter does assist with some basic ADL tasks. She has another daughter in the area that works. X-rays and imaging revealed T12 osteoporotic burst fracture with stenosis and myelopathy. Patient initially with conservative care later underwent T11-T12 decompressive laminectomy with left T12 transpedicular reduction of fracture and multilevel arthrodesis as well as prophylactic T9 vertebral plasty 10/19/2018 per Dr. Annette Stable. Hospital course pain management. Back brace when out of bed. Acute blood loss anemia 9.3 and monitored. Acute on chronic hyponatremia 125-129. Therapy evaluations completed. M.D. Has requested physical medicine rehabilitation consult.   Review of Systems  Constitutional: Negative for chills and fever.  HENT: Negative for hearing loss.   Eyes: Negative for blurred vision and double vision.  Respiratory: Negative for cough and shortness of breath.   Cardiovascular: Negative for chest pain, palpitations and leg swelling.  Gastrointestinal: Positive for constipation. Negative for nausea and vomiting.  Genitourinary: Negative for dysuria, flank pain and hematuria.  Musculoskeletal: Positive for back pain and myalgias.  Skin: Negative for rash.  Neurological: Negative for focal weakness and weakness.  All other systems reviewed and are negative.  Past Medical History:  Diagnosis Date  . Aortic atherosclerosis ([place]) 10/17/2018  . Arthritis    hands  . Carpal tunnel syndrome of left  wrist 08/2016  . Dental crowns present   . Family history of adverse reaction to anesthesia    pt's daughter has hx. of post-op N/V  . Hypertension    states under control with meds., has been on med. x "long time"  . PONV (postoperative nausea and vomiting)   . White coat hypertension    Past Surgical History:  Procedure Laterality Date  . ANTERIOR AND POSTERIOR VAGINAL REPAIR  11/08/2002  . APPENDECTOMY    . BOWEL RESECTION  01/08/2012   Procedure: SMALL BOWEL RESECTION;  Surgeon: Imogene Burn. Georgette Dover, MD;  Location: [facility];  Service: General;  Laterality: N/A;  . CARPAL TUNNEL RELEASE Left 08/29/2016   Procedure: CARPAL TUNNEL RELEASE, left;  Surgeon: Leanora Cover, MD;  Location: [place];  Service: Orthopedics;  Laterality: Left;  . CATARACT EXTRACTION W/ INTRAOCULAR LENS IMPLANT Left 01/09/2011  . CATARACT EXTRACTION W/ INTRAOCULAR LENS IMPLANT Right 03/06/2011  . COLONOSCOPY WITH PROPOFOL N/A 08/24/2013   Procedure: COLONOSCOPY WITH PROPOFOL;  Surgeon: Garlan Fair, MD;  Location: [facility];  Service: Endoscopy;  Laterality: N/A;  . ESOPHAGOGASTRODUODENOSCOPY (EGD) WITH PROPOFOL N/A 08/24/2013   Procedure: ESOPHAGOGASTRODUODENOSCOPY (EGD) WITH PROPOFOL;  Surgeon: Garlan Fair, MD;  Location: [facility];  Service: Endoscopy;  Laterality: N/A;  . EXTERNAL EAR SURGERY Bilateral    stapedectomy   . INTRAMEDULLARY (IM) NAIL INTERTROCHANTERIC Left 10/14/2017   Procedure: INTRAMEDULLARY (IM) NAIL INTERTROCHANTRIC;  Surgeon: Paralee Cancel, MD;  Location: [facility];  Service: Orthopedics;  Laterality: Left;  . LAPAROSCOPIC ASSISTED VAGINAL HYSTERECTOMY  11/08/2002  . LAPAROSCOPIC BILATERAL SALPINGO OOPHERECTOMY Bilateral 11/08/2002  . LAPAROSCOPIC LYSIS OF ADHESIONS  11/08/2002  . LAPAROTOMY  01/08/2012   Procedure: EXPLORATORY LAPAROTOMY;  Surgeon: Imogene Burn. Tsuei, MD;  Location: [facility];  Service: General;  Laterality: N/A;  . LUMBAR FUSION  03/19/2010   L3-4, L4-5  .  LUMBAR LAMINECTOMY/DECOMPRESSION MICRODISCECTOMY  03/19/2010   L3-4, L4-5  . SHOULDER ARTHROSCOPY W/ ROTATOR CUFF REPAIR    . TONSILLECTOMY     age 20   Family History  Problem Relation Age of Onset  . Anesthesia problems Daughter        post-op N/V   Social History:  reports that she has never smoked. She has never used smokeless tobacco. She reports that she does not drink alcohol or use drugs. Allergies:  Allergies  Allergen Reactions  . Penicillins Hives and Rash    Many years ago Has patient had a PCN reaction causing immediate rash, facial/tongue/throat swelling, SOB or lightheadedness with hypotension: No Has patient had a PCN reaction causing severe rash involving mucus membranes or skin necrosis: No Has patient had a PCN reaction that required hospitalization: No Has patient had a PCN reaction occurring within the last 10 years: No If all of the above answers are "NO", then may proceed with Cephalosporin use.   . Sulfa Antibiotics Rash   Medications Prior to Admission  Medication Sig Dispense Refill  . acetaminophen (TYLENOL) 500 MG tablet Take 500-1,000 mg by mouth 2 (two) times daily as needed for mild pain.     Marland Kitchen betamethasone valerate (VALISONE) 0.1 % cream Apply 1 application topically 2 (two) times daily as needed (itching).     . Calcium Carb-Cholecalciferol (CALCIUM+D3) 600-800 MG-UNIT TABS Take 1 tablet by mouth 2 (two) times daily with a meal.    . Cholecalciferol (VITAMIN D) 2000 units tablet Take 2,000 Units by mouth daily.    . clobetasol cream (TEMOVATE) 5.27 % Apply 1 application topically 2 (two) times daily.  2  . denosumab (PROLIA) 60 MG/ML SOSY injection Inject 60 mg into the skin every 6 (six) months.    Marland Kitchen HYDROcodone-acetaminophen (NORCO/VICODIN) 5-325 MG tablet Take 2 tablets by mouth every 6 (six) hours as needed. 24 tablet 0  . losartan (COZAAR) 100 MG tablet Take 100 mg by mouth every morning.     . meloxicam (MOBIC) 15 MG tablet Take 15 mg by mouth  daily.    . methocarbamol (ROBAXIN) 500 MG tablet Take 500 mg by mouth daily.  0  . Misc Natural Products (GLUCOSAMINE CHONDROITIN TRIPLE PO) Take 1 capsule by mouth 2 (two) times daily.     . Multiple Vitamin (MULTIVITAMIN) tablet Take 1 tablet by mouth daily with supper.     . Multiple Vitamins-Minerals (PRESERVISION AREDS 2 PO) Take 1 tablet by mouth 2 (two) times daily.    . Omega-3 Fatty Acids (FISH OIL PO) Take 1 capsule by mouth daily.     Vladimir Faster Glycol-Propyl Glycol (SYSTANE) 0.4-0.3 % SOLN Place 1 drop into both eyes 4 (four) times daily as needed (dry eyes).     . polyethylene glycol (MIRALAX / GLYCOLAX) packet Take 17 g daily by mouth. (Patient taking differently: Take 17 g by mouth daily as needed for mild constipation. ) 14 each 0  . verapamil (VERELAN PM) 360 MG 24 hr capsule Take 360 mg by mouth every morning.     . traMADol (ULTRAM) 50 MG tablet Take 1 tablet (50 mg total) by mouth every 6 (six) hours as needed for moderate pain or severe pain. (Patient not taking: Reported on 10/12/2018) 10 tablet 0    Home: Home Living Family/patient expects to be discharged to:: Private residence Living  Arrangements: Children Available Help at Discharge: Family, Available PRN/intermittently Type of Home: House Home Access: Ramped entrance Home Layout: One level Bathroom Shower/Tub: Chiropodist: Handicapped height Home Equipment: Tub bench, Environmental consultant - 4 wheels, Bedside commode, Wheelchair - manual Additional Comments: Pt's special needs daughter lives with pt, however, unable to physically assist.   Functional History: Prior Function Level of Independence: Needs assistance Gait / Transfers Assistance Needed: Since having increased pain, has had difficulty ambulating and has required assist with use of walker. Prior to the last 3 weeks, pt was using rollator for ambulation.  ADL's / Homemaking Assistance Needed: Pt's daughter has been helping with ADL tasks.    Functional Status:  Mobility: Bed Mobility Overal bed mobility: Needs Assistance Bed Mobility: Rolling, Sidelying to Sit Rolling: Mod assist Sidelying to sit: Min assist Supine to sit: Mod assist, +2 for physical assistance Sit to sidelying: Mod assist General bed mobility comments: Mod assist for initiating rolling (likely because she was anxious at anticipation of pain); min assist to elevate trunk to sit Transfers Overall transfer level: Needs assistance Equipment used: Rolling walker (2 wheeled) Transfer via Lift Equipment: Stedy Transfers: Sit to/from Stand, [employer] Transfers Sit to Stand: Mod assist, +2 physical assistance, +2 safety/equipment, From elevated surface Stand pivot transfers: Mod assist, +2 physical assistance, +2 safety/equipment General transfer comment: Pt modAx2 for powerup into stance and for stability, Fatigues easily Ambulation/Gait Ambulation/Gait assistance: Mod assist, +2 physical assistance Gait Distance (Feet): 5 Feet Assistive device: Rolling walker (2 wheeled) Gait Pattern/deviations: Decreased step length - right, Decreased step length - left General Gait Details: Mod assist of 2 for safety as she tended to fatigue and sink into bilateral knee flexion; cues to use RW for better support    ADL: ADL Overall ADL's : Needs assistance/impaired Eating/Feeding: Set up, Sitting Grooming: Set up, Sitting Grooming Details (indicate cue type and reason): +2 Assist to stand at this time, unable to tolerate/perform grooming in standing. Upper Body Bathing: Moderate assistance, Sitting Lower Body Bathing: +2 for physical assistance, +2 for safety/equipment, Total assistance, Sit to/from stand Upper Body Dressing : Sitting, Maximal assistance Upper Body Dressing Details (indicate cue type and reason): To don brace sitting up at EOB - education in tightening and strap placement Lower Body Dressing: Total assistance, +2 for physical assistance, +2 for  safety/equipment, Sit to/from stand Lower Body Dressing Details (indicate cue type and reason): unable to complete figure 4 Toilet Transfer: +2 for physical assistance, +2 for safety/equipment, Stand-pivot, BSC, RW, Moderate assistance(Simulated transfer from EOB to recliner chair +2 Mod Assist, fatigues easily) Toilet Transfer Details (indicate cue type and reason): Fatigues easily, pt states she hasn't eaten and also has not tolerated OOB in a few days Toileting- Clothing Manipulation and Hygiene: Maximal assistance, +2 for physical assistance, +2 for safety/equipment, Sit to/from stand Toileting - Clothing Manipulation Details (indicate cue type and reason): able to complete front peri care  sitting at EOB Functional mobility during ADLs: Moderate assistance, +2 for physical assistance, +2 for safety/equipment, Rolling walker, Cueing for sequencing, Cueing for safety General ADL Comments: Pt was re-evaluated for OT following recent surgery 10/19/18. Pt able to tolerate OOB activities this date. Goals have been updated and recommendation of short term Rehab at SNF is appropriate. Pt participated in ADL retrainign session with focus on bed mobility, brace application, sitting EOB for LB ADL's, peri-care from front and standing with + 2 Assist for bathing LE's as pt catheter had leaked & gown was soiled.  Assist to change gown in sitting as well. Will follow acutely for OT.  Cognition: Cognition Overall Cognitive Status: Within Functional Limits for tasks assessed Orientation Level: Oriented X4 Cognition Arousal/Alertness: Awake/alert Behavior During Therapy: WFL for tasks assessed/performed Overall Cognitive Status: Within Functional Limits for tasks assessed  Blood pressure 137/71, pulse 81, temperature 98.3 F (36.8 C), temperature source Oral, resp. rate 17, height 5\' 3"  (1.6 m), weight 67.7 kg, SpO2 93 %. Physical Exam  Vitals reviewed. Constitutional: She is oriented to person, place, and  time. She appears well-developed and well-nourished.  HENT:  Head: Normocephalic and atraumatic.  Eyes: EOM are normal. Right eye exhibits no discharge. Left eye exhibits no discharge.  Neck: Normal range of motion. Neck supple. No thyromegaly present.  Cardiovascular: Normal rate and regular rhythm.  Respiratory: Effort normal and breath sounds normal. No respiratory distress.  GI: Soft. Bowel sounds are normal. She exhibits no distension.  Musculoskeletal:  No edema or tenderness in extremities  Neurological: She is alert and oriented to person, place, and time.  Sensation intact light touch. Motor: Bilateral upper extremities: 4+/5 proximal distal Bilateral lower extremities: 4-4+/5 proximal distal  Skin:  Back incision is dressed and drain remains in place.  Psychiatric: She has a normal mood and affect. Her behavior is normal. Thought content normal.    Results for orders placed or performed during the hospital encounter of 10/12/18 (from the past 24 hour(s))  CBC     Status: Abnormal   Collection Time: 10/20/18 11:58 AM  Result Value Ref Range   WBC 16.2 (H) 4.0 - 10.5 K/uL   RBC 3.53 (L) 3.87 - 5.11 MIL/uL   Hemoglobin 11.1 (L) 12.0 - 15.0 g/dL   HCT 33.8 (L) 36.0 - 46.0 %   MCV 95.8 80.0 - 100.0 fL   MCH 31.4 26.0 - 34.0 pg   MCHC 32.8 30.0 - 36.0 g/dL   RDW 15.5 11.5 - 15.5 %   Platelets 291 150 - 400 K/uL   nRBC 0.0 0.0 - 0.2 %  Basic metabolic panel     Status: Abnormal   Collection Time: 10/20/18 11:58 AM  Result Value Ref Range   Sodium 128 (L) 135 - 145 mmol/L   Potassium 4.4 3.5 - 5.1 mmol/L   Chloride 96 (L) 98 - 111 mmol/L   CO2 24 22 - 32 mmol/L   Glucose, Bld 128 (H) 70 - 99 mg/dL   BUN 16 8 - 23 mg/dL   Creatinine, Ser 0.84 0.44 - 1.00 mg/dL   Calcium 7.8 (L) 8.9 - 10.3 mg/dL   GFR calc non Af Amer >60 >60 mL/min   GFR calc Af Amer >60 >60 mL/min   Anion gap 8 5 - 15  CBC     Status: Abnormal   Collection Time: 10/20/18  9:04 PM  Result Value Ref  Range   WBC 13.6 (H) 4.0 - 10.5 K/uL   RBC 3.00 (L) 3.87 - 5.11 MIL/uL   Hemoglobin 9.3 (L) 12.0 - 15.0 g/dL   HCT 29.1 (L) 36.0 - 46.0 %   MCV 97.0 80.0 - 100.0 fL   MCH 31.0 26.0 - 34.0 pg   MCHC 32.0 30.0 - 36.0 g/dL   RDW 15.7 (H) 11.5 - 15.5 %   Platelets 203 150 - 400 K/uL   nRBC 0.0 0.0 - 0.2 %  Lactic acid, plasma     Status: None   Collection Time: 10/20/18  9:04 PM  Result Value Ref Range  Lactic Acid, Venous 1.4 0.5 - 1.9 mmol/L   Dg Lumbar Spine Complete  Result Date: 10/19/2018 CLINICAL DATA:  Intraoperative localization for T10 through L5 fusion with T9 vertebroplasty. EXAM: DG C-ARM 61-120 MIN; LUMBAR SPINE - COMPLETE 4+ VIEW COMPARISON:  Prior CT from 10/13/2018. FINDINGS: Multiple spot intraoperative fluoroscopic PA and lateral views of the thoracolumbar spine demonstrate posterior fusion at T10 through L5 hardware appears grossly well position without adverse features. Vertebral augmentation at T9. IMPRESSION: Intraoperative fluoroscopic localization for T10-L5 fusion with T9 vertebral augmentation. Electronically Signed   By: Jeannine Boga M.D.   On: 10/19/2018 20:36   Dg C-arm 1-60 Min  Result Date: 10/19/2018 CLINICAL DATA:  Intraoperative localization for T10 through L5 fusion with T9 vertebroplasty. EXAM: DG C-ARM 61-120 MIN; LUMBAR SPINE - COMPLETE 4+ VIEW COMPARISON:  Prior CT from 10/13/2018. FINDINGS: Multiple spot intraoperative fluoroscopic PA and lateral views of the thoracolumbar spine demonstrate posterior fusion at T10 through L5 hardware appears grossly well position without adverse features. Vertebral augmentation at T9. IMPRESSION: Intraoperative fluoroscopic localization for T10-L5 fusion with T9 vertebral augmentation. Electronically Signed   By: Jeannine Boga M.D.   On: 10/19/2018 20:36   Dg C-arm 1-60 Min  Result Date: 10/19/2018 CLINICAL DATA:  Intraoperative localization for T10 through L5 fusion with T9 vertebroplasty. EXAM: DG  C-ARM 61-120 MIN; LUMBAR SPINE - COMPLETE 4+ VIEW COMPARISON:  Prior CT from 10/13/2018. FINDINGS: Multiple spot intraoperative fluoroscopic PA and lateral views of the thoracolumbar spine demonstrate posterior fusion at T10 through L5 hardware appears grossly well position without adverse features. Vertebral augmentation at T9. IMPRESSION: Intraoperative fluoroscopic localization for T10-L5 fusion with T9 vertebral augmentation. Electronically Signed   By: Jeannine Boga M.D.   On: 10/19/2018 20:36    Assessment/Plan: Diagnosis: Thoracic decompression Labs and images (see above) independently reviewed.  Records reviewed and summated above.  1. Does the need for close, 24 hr/day medical supervision in concert with the patient's rehab needs make it unreasonable for this patient to be served in a less intensive setting? Potentially  2. Co-Morbidities requiring supervision/potential complications: Acute on chronic hyponatremia (continue to monitor, treat if necessary), Acute blood loss anemia (repeat labs, transfuse to ensure appropriate perfusion for increased activity tolerance), post-op pain management (Biofeedback training with therapies to help reduce reliance on opiate pain medications, monitor pain control during therapies, and sedation at rest and titrate to maximum efficacy to ensure participation and gains in therapies), HTN (monitor and provide prns in accordance with increased physical exertion and pain), prior L3- L5 decompression and fusion surgery, leukocytosis (repeat labs, cont to monitor for signs and symptoms of infection, further workup if indicated), ID (DC IV Vanco and appropriate) 3. Due to bladder management, safety, skin/wound care, disease management, pain management and patient education, does the patient require 24 hr/day rehab nursing? Yes 4. Does the patient require coordinated care of a physician, rehab nurse, PT (1-2 hrs/day, 5 days/week) and OT (1-2 hrs/day, 5 days/week)  to address physical and functional deficits in the context of the above medical diagnosis(es)? Yes Addressing deficits in the following areas: balance, endurance, locomotion, strength, transferring, bathing, dressing, toileting and psychosocial support 5. Can the patient actively participate in an intensive therapy program of at least 3 hrs of therapy per day at least 5 days per week? Potentially 6. The potential for patient to make measurable gains while on inpatient rehab is good 7. Anticipated functional outcomes upon discharge from inpatient rehab are min assist  with PT, min  assist with OT, n/a with SLP. 8. Estimated rehab length of stay to reach the above functional goals is: 14-18 days. 9. Anticipated D/C setting: Other 10. Anticipated post D/C treatments: SNF 11. Overall Rehab/Functional Prognosis: good  RECOMMENDATIONS: This patient's condition is appropriate for continued rehabilitative care in the following setting: Patient with decreased activity tolerance and need for support at discharge.  At this time recommend SNF for less intense prolonged duration of therapies.  She also states she is ready to go to [facility] and has had a good experience there before. Patient has agreed to participate in recommended program. Potentially  Note that insurance prior authorization may be required for reimbursement for recommended care.  Comment: Rehab Admissions Coordinator to follow up.   I have personally performed a face to face diagnostic evaluation, including, but not limited to relevant history and physical exam findings, of this patient and developed relevant assessment and plan.  Additionally, I have reviewed and concur with the physician assistant's documentation above.   Delice Lesch, MD, ABPMR   Lavon Paganini Angiulli, PA-C 10/21/2018

## 2018-10-21 NOTE — Progress Notes (Signed)
Physical Therapy Treatment Patient Details Name: Laurie Galloway MRN: [id number] DOB: 1930-03-23 Today's Date: 10/21/2018    History of Present Illness Pt is an 84 y/o female admitted secondary to worsening back pain. Pt with recent trip to ED and was found to have T12 compression fracture, and transverse process fracture at L1. Imaging also revelaed old compression fractures at L2-4. PMH includes HTN and lumbar surgery. Now, s/p Thoracic ten-Lumbar five posterior lateral fusion, T9 vertebroplasty    PT Comments    Continuing work on functional mobility and activity tolerance;  Needing encouragement, but participating well; Worked on progressive ambulation, able to perform 3 bouts of approx 5-6 feet each; Fatigues quickly, though, and needs a close follow of the recliner for safety as she tends to sit without control;   Laurie Galloway still needs post-acute rehab to maximize independence and safety with mobility prior to dc home;  CIR is an option worth considering -- my concern at this time for CIR is her decr activity tolerance; Will place a screen for Rehab Admissions Coord to weigh in;  Noted we have insurance auth for SNF  Follow Up Recommendations  Supervision/Assistance - 24 hour;Other (comment)(Post-acute rehab)     Equipment Recommendations  None recommended by PT(well-equipped)    Recommendations for Other Services       Precautions / Restrictions Precautions Precautions: Back;Fall Required Braces or Orthoses: Spinal Brace Spinal Brace: Thoracolumbosacral orthotic    Mobility  Bed Mobility               General bed mobility comments: in recliner upon arrival  Transfers Overall transfer level: Needs assistance Equipment used: Rolling walker (2 wheeled) Transfers: Sit to/from Stand Sit to Stand: Mod assist;+2 physical assistance;+2 safety/equipment;From elevated surface         General transfer comment: Pt modAx2 for powerup into stance and for stability, especially  when moving hands from armrests to RW, Fatigues easily; tends to sit without worning with decr control of descent  Ambulation/Gait Ambulation/Gait assistance: Mod assist;+2 physical assistance;+2 safety/equipment Gait Distance (Feet): 18 Feet(3 bouts of about 6 feet) Assistive device: Rolling walker (2 wheeled) Gait Pattern/deviations: Decreased step length - right;Decreased step length - left     General Gait Details: Mod assist of 2 for safety as she tended to fatigue and sink into bilateral knee flexion; cues to use RW for better support; noted erratic step width, tending to narrow with fatigue; chair kept close behind during gait training for safety   Stairs             Wheelchair Mobility    Modified Rankin (Stroke Patients Only)       Balance     Sitting balance-Leahy Scale: Fair       Standing balance-Leahy Scale: Poor Standing balance comment: Heavy reliance on UE's/RW                            Cognition Arousal/Alertness: Awake/alert Behavior During Therapy: WFL for tasks assessed/performed Overall Cognitive Status: Within Functional Limits for tasks assessed                                        Exercises      General Comments        Pertinent Vitals/Pain Pain Assessment: Faces Faces Pain Scale: Hurts little more Pain Location: back and BLEs  Pain Descriptors / Indicators: Aching;Grimacing;Sharp Pain Intervention(s): Monitored during session;Repositioned    Home Living                      Prior Function            PT Goals (current goals can now be found in the care plan section) Acute Rehab PT Goals Patient Stated Goal: rehab then home PT Goal Formulation: With patient(Goals set 11/12 continue to be appropriate) Time For Goal Achievement: 11/03/18 Potential to Achieve Goals: Fair Progress towards PT goals: Progressing toward goals(slowly)    Frequency    Min 2X/week      PT Plan Current  plan remains appropriate    Co-evaluation              AM-PAC PT "6 Clicks" Daily Activity  Outcome Measure  Difficulty turning over in bed (including adjusting bedclothes, sheets and blankets)?: A Lot Difficulty moving from lying on back to sitting on the side of the bed? : Unable Difficulty sitting down on and standing up from a chair with arms (e.g., wheelchair, bedside commode, etc,.)?: Unable Help needed moving to and from a bed to chair (including a wheelchair)?: A Lot Help needed walking in hospital room?: A Lot Help needed climbing 3-5 steps with a railing? : Total 6 Click Score: 9    End of Session Equipment Utilized During Treatment: Gait belt;Back brace Activity Tolerance: Patient tolerated treatment well Patient left: in chair;with call bell/phone within reach;with chair alarm set;with nursing/sitter in room Nurse Communication: Mobility status PT Visit Diagnosis: Unsteadiness on feet (R26.81);Repeated falls (R29.6);Muscle weakness (generalized) (M62.81);History of falling (Z91.81);Pain Pain - part of body: (Back)     Time: 8756-4332 PT Time Calculation (min) (ACUTE ONLY): 26 min  Charges:  $Gait Training: 8-22 mins $Therapeutic Activity: 8-22 mins                     Roney Marion, PT  Acute Rehabilitation Services Pager [phone] Office [place] 10/21/2018, 12:21 PM

## 2018-10-21 NOTE — Plan of Care (Signed)
  Problem: Education: Goal: Knowledge of General Education information will improve Description: Including pain rating scale, medication(s)/side effects and non-pharmacologic comfort measures Outcome: Progressing   Problem: Clinical Measurements: Goal: Ability to maintain clinical measurements within normal limits will improve Outcome: Progressing Goal: Respiratory complications will improve Outcome: Progressing   Problem: Activity: Goal: Risk for activity intolerance will decrease Outcome: Progressing   Problem: Nutrition: Goal: Adequate nutrition will be maintained Outcome: Progressing   Problem: Coping: Goal: Level of anxiety will decrease Outcome: Progressing   Problem: Pain Managment: Goal: General experience of comfort will improve Outcome: Progressing   Problem: Skin Integrity: Goal: Risk for impaired skin integrity will decrease Outcome: Progressing   

## 2018-10-21 NOTE — Consult Note (Signed)
            [facility] CM Primary Care Navigator  10/21/2018  Laurie Galloway [birth year] [id number]   Went to seepatientat the bedside to identify possible discharge needs.  Patientreports having persistent "severe back pain" from a previous fall at home. (Thoracic decompression underwent T11-T12 decompressive laminectomy with left T12 transpedicular reduction of fracture and multilevel arthrodesis as well as prophylactic T9 vertebral plasty, acute blood loss anemia, acute on chronic hyponatremia)  Patient endorses Dr. Mayra Neer with [place] [facility] primary care provider.   [facility] [address] [facility] to obtain medications without difficulty.  Patientstates managingherownmedications at [employer] out of the containers.  Patient reports thatshe has been driving prior to admission but her other daughter Anne Ng- works) will be providing transportation to [employer]' appointments after discharge.   [employer] transport benefits discussed with patient as well.  Patient states thatshe lives with her special needs daughter at home who is currently being taken cared of by sitters and her other daughter. Patient states that her other daughter will make sure that she will be assisted with needs at home when discharged.  Anticipated discharge plan is skilled nursing facility(SNF- in process, prefers [facility])for rehabilitation pertherapy recommendation.  Patientvoiced understanding to call primary care provider's officewhenshe will returnhome,for a post discharge follow-up visitwithin1- 2weeksor sooner if needed.Patient letter (with PCP's contact number) was provided asherreminder.  Explained topatient about[facility] CM services available for health management andresourcesat homebut shedenies needing services at this time.Patientstates that she hasbeen managing  herhealth condition (HTN- under control)so far. Patientexpressedunderstandingto seekreferral from primary care provider to [facility] care management ifdeemed necessary and appropriatefor any servicesin the future- once she gets back home.  [facility] care management information was provided for future needs that shemay have.  Primary care provider's office is listed as providing transition of care (TOC) follow-up.   For additional questions please contact:  Laurie Galloway, BSN, RN-BC [facility] PRIMARY CARE Navigator Cell: [phone]

## 2018-10-21 NOTE — Progress Notes (Signed)
Patient Demographics:    Laurie Galloway, is a 84 y.o. female, DOB - [birth year], YFV:[id number]  Admit date - 10/12/2018   Admitting Physician Laurie Patience, MD  Outpatient Primary MD for the patient is Laurie Neer, MD  LOS - 7   Chief Complaint  Patient presents with  . Back Pain        Subjective:    Laurie Galloway today has no fevers, no emesis,  No chest pain,    Assessment  & Plan :    Principal Problem:   Compression fracture of body of thoracic vertebra (HCC) Active Problems:   HTN (hypertension)   Back pain   Pathologic thoracic fracture, initial encounter   Hyponatremia   Aortic atherosclerosis (HCC)   Compression fracture of T12 vertebra (HCC)   Acute blood loss anemia   Postoperative pain   Leukocytosis    Brief Narrative: 83 year old with past medical history relevant for osteoporosis, hypertension, chronic back pain admitted on 10/12/2018 with acute on chronic back pain and difficulty ambulating with evidence of acute T12 fracture and L1 transverse process fracture with  neurosurgical and Interventional radiologytreatment with course complicatedby hyponatremia.   Plan:- 1) Hyponatremia: Stable and not profoundly low. -Despite instructions to avoid free water patient continues to drink excessive amount of free water even while in the hospital, she is again reminded to be compliant with free water restriction-Continue salt tablets 1 g 3 times daily with meals Restrict Free water    2) Acute T12/L1 fracture: Likely secondary mechanical fall in a patient with osteoporosis, status post neurosurgical repair on 10/19/2018 by Laurie Galloway- Pt underwent T11-T12 decompressive laminectomy with left T12 transpedicular reduction of fracture,,,T10-T11-T12 L1-L2 L3-L4-L5 posterior lateral arthrodesis utilizing locally harvested autograft, morselized allograft and segmental pedicle  screw fixation Methylmethacrylate augmentation of T10-T11 and L1 pedicle screws bilaterally,,,Prophylactic T9 methylmethacrylate vertebroplasty.  Use back brace when upright or ambulating, activity limitations and restrictions as advised by her neurosurgeon and your physical therapist -Continue pain control  3) Hypertension: Hold losartan 100 mg every morning due to hyponatremia -Continue verapamil 360 mg every morning -Continue PRN oral medications including hydralazine and clonidine for systolic blood pressure greater than 180  4)) Osteoporosis:--May resume denosumab as outpatient every 6 months -Continue home vitamin D and calcium tablets   Code Status- DNR  Prophylaxis: SCDs  Disposition: Pending neurosurgery  Lab Results  Component Value Date   PLT 203 10/20/2018    Inpatient Medications  Scheduled Meds: . acetaminophen  1,000 mg Oral QID  . calcium-vitamin D  1 tablet Oral BID WC  . cholecalciferol  2,000 Units Oral Daily  . methocarbamol  500 mg Oral QID  . omega-3 acid ethyl esters  1 g Oral Daily  . senna  1 tablet Oral Daily  . sodium chloride flush  3 mL Intravenous Q12H  . sodium chloride  1 g Oral TID WC  . verapamil  360 mg Oral q morning - 10a   Continuous Infusions: . sodium chloride 250 mL (10/19/18 2050)  . lactated ringers 10 mL/hr at 10/19/18 1352  . vancomycin 1,000 mg (10/21/18 1450)   PRN Meds:.acetaminophen **OR** acetaminophen, bisacodyl, diazepam, hydrALAZINE, HYDROcodone-acetaminophen, HYDROmorphone (DILAUDID) injection, menthol-cetylpyridinium **OR** phenol, morphine injection, ondansetron **OR** ondansetron (ZOFRAN) IV,  oxyCODONE, polyethylene glycol, sodium chloride flush, sodium phosphate    Anti-infectives (From admission, onward)   Start     Dose/Rate Route Frequency Ordered Stop   10/20/18 1400  vancomycin (VANCOCIN) IVPB 1000 mg/200 mL premix     1,000 mg 200 mL/hr over 60 Minutes Intravenous Every 24 hours 10/19/18 2048      10/19/18 1735  vancomycin (VANCOCIN) powder  Status:  Discontinued       As needed 10/19/18 1735 10/19/18 1823   10/19/18 1520  bacitracin 50,000 Units in sodium chloride 0.9 % 500 mL irrigation  Status:  Discontinued       As needed 10/19/18 1520 10/19/18 1823   10/19/18 1332  vancomycin (VANCOCIN) 1-5 GM/200ML-% IVPB    Note to Pharmacy:  Laurie Galloway   : cabinet override      10/19/18 1332 10/20/18 0144        Objective:   Vitals:   10/21/18 0500 10/21/18 0513 10/21/18 0816 10/21/18 1500  BP:  (!) 147/67 137/71 114/76  Pulse:  84 81 90  Resp:  16 17   Temp:  98.1 F (36.7 C) 98.3 F (36.8 C) 98.3 F (36.8 C)  TempSrc:  Oral Oral Oral  SpO2:  96% 93% 96%  Weight: 67.7 kg     Height: 5\' 3"  (1.6 m)       Wt Readings from Last 3 Encounters:  10/21/18 67.7 kg  10/11/18 61.2 kg  07/28/18 64.4 kg     Intake/Output Summary (Last 24 hours) at 10/21/2018 1851 Last data filed at 10/21/2018 1700 Gross per 24 hour  Intake 1539 ml  Output 425 ml  Net 1114 ml     Physical Exam Gen:- Awake Alert, no acute distress  HEENT:- [place].AT, No sclera icterus Neck-Supple Neck,No JVD,.  Lungs-  CTAB , good air movement bilaterally  CV- S1, S2 normal, regular Abd-  +ve B.Sounds, Abd Soft, No tenderness,    Extremity/Skin:- No  edema,   good pulses Psych-affect is appropriate, oriented x3 Neuro-no new focal deficits, no tremors  MSK-postoperative thoracic spine area wound clean dry and intact   Data Review:   Micro Results Recent Results (from the past 240 hour(s))  Surgical pcr screen     Status: None   Collection Time: 10/15/18 10:45 AM  Result Value Ref Range Status   MRSA, PCR NEGATIVE NEGATIVE Final   Staphylococcus aureus NEGATIVE NEGATIVE Final    Comment: (NOTE) The Xpert SA Assay (FDA approved for NASAL specimens in patients 43 years of age and older), is one component of a comprehensive surveillance program. It is not intended to diagnose infection nor to guide  or monitor treatment. Performed at [facility], [place] [address] [place], [place] [address]     Radiology Reports Dg Lumbar Spine Complete  Result Date: 10/19/2018 CLINICAL DATA:  Intraoperative localization for T10 through L5 fusion with T9 vertebroplasty. EXAM: DG C-ARM 61-120 MIN; LUMBAR SPINE - COMPLETE 4+ VIEW COMPARISON:  Prior CT from 10/13/2018. FINDINGS: Multiple spot intraoperative fluoroscopic PA and lateral views of the thoracolumbar spine demonstrate posterior fusion at T10 through L5 hardware appears grossly well position without adverse features. Vertebral augmentation at T9. IMPRESSION: Intraoperative fluoroscopic localization for T10-L5 fusion with T9 vertebral augmentation. Electronically Signed   By: Jeannine Boga M.D.   On: 10/19/2018 20:36   Ct Thoracic Spine Wo Contrast  Result Date: 10/13/2018 CLINICAL DATA:  Thoracic vertebral body fracture osteoporosis. EXAM: CT THORACIC SPINE WITHOUT CONTRAST TECHNIQUE: Multidetector CT  images of the thoracic were obtained using the standard protocol without intravenous contrast. COMPARISON:  Same day lumbar spine CT FINDINGS: Alignment: Maintained Vertebrae: 3 column fracture of T12 with 90% height loss and 10 mm of retropulsion contributing to at least 50% luminal narrowing of the central canal. Osteopenic appearance of the remainder of the thoracic vertebrae with striated vertical trabecular markings. No aggressive osseous lesions or additional fractures involving thoracic spine. Acute nondisplaced left L1 transverse process fracture, series 5/56. Healed medial right eleventh rib fracture with callus, series 5/127. Paraspinal and other soft tissues: Atherosclerosis of the aortic arch and descending thoracic aorta without aneurysm. Coarse rounded 12 mm calcification at the base of the neck on the right, nonspecific possibly vascular in etiology or associated with the right thyroid. No paraspinal soft tissue mass or hemorrhage.  No adenopathy. Hypodense right adrenal nodule consistent with an adenoma. Mild peribronchial thickening and perihilar as well as bibasilar atelectasis. Disc levels: No significant disc protrusions of the thoracic spine. Cervical spondylosis with multilevel degenerative disc disease from the included C5 inferior endplate through T1. IMPRESSION: 1. 3 column fracture of T12 with 90% height loss and 10 mm of retropulsion contributing to at least 50% luminal narrowing of the central canal. 2. Acute nondisplaced left L1 transverse process fracture. 3. Healed medial right eleventh rib fracture with callus. 4. Right adrenal adenoma. 5. Degenerative disc disease of the included lower cervical spine from C5 through T1. 6. Osteopenic appearance of the thoracic spine with accentuated vertical trabecular markings noted. Aortic Atherosclerosis (ICD10-I70.0). Electronically Signed   By: Ashley Royalty M.D.   On: 10/13/2018 21:57   Ct Lumbar Spine Wo Contrast  Result Date: 10/11/2018 CLINICAL DATA:  Fall 6 weeks ago.  Back pain. EXAM: CT LUMBAR SPINE WITHOUT CONTRAST TECHNIQUE: Multidetector CT imaging of the lumbar spine was performed without intravenous contrast administration. Multiplanar CT image reconstructions were also generated. COMPARISON:  07/28/2018 radiographs FINDINGS: Segmentation: The lowest lumbar type non-rib-bearing vertebra is labeled as L5. Alignment: 9 mm anterolisthesis at L3-4, chronic. 9 mm anterolisthesis at L4-5, chronic. Vertebrae: Severe compression fracture at T12 with 90% loss of height including a horizontal component, an oblique component extending to both superior and inferior endplates, with fracture extending into the bases of both pedicles but no well-defined fracture involving the lamina or spinous process. There is 11 mm of posterior bony retropulsion at this level, narrowing the AP diameter of the thecal sac to 8 mm compatible with moderate central narrowing of the thecal sac the bony  retropulsion is mildly eccentric to the left. Acute left transverse process fracture at L1. Compression fractures at L2, L3, and L4 are roughly similar to the 07/28/2018 exam. Posterolateral rod and pedicle screw fixation at L3-L4-L5. Paraspinal and other soft tissues: Aortoiliac atherosclerotic vascular disease. 3.1 by 1.8 cm right adrenal adenoma. Sigmoid colon diverticulosis. Disc levels: As noted above there is at least moderate central narrowing of the thecal sac at the T12 level and potentially some mild right foraminal stenosis due to spurring at T11-12. L1-2: No impingement.  Disc bulge. L2-3: Prominent left and moderate right foraminal stenosis due to disc bulge and facet and intervertebral spurring. L3-4: Suspected mild left foraminal stenosis due to spurring and subluxation. L4-5: Unremarkable.  Posterior decompression. L5-S1: Mild bilateral foraminal stenosis due to intervertebral spurring and disc bulge. IMPRESSION: 1. The dominant finding is a severe compression fracture at T12 involving all 3 columns, with 90% loss of height and 11 mm of posterior bony retropulsion leading  to at least moderate central narrowing of the thecal sac. There fractures through the pedicles as well as both transverse and oblique fractures through the vertebral body. This is new compared to 07/28/2018. 2. Acute left transverse process fracture at L1. 3. Old compression fractures at L2, L3, and L4 with posterolateral rod and pedicle screw fixation and posterior decompression. 4. Lumbar spondylosis and degenerative disc disease causing prominent impingement at L2-3, and mild impingement at L3-4 and L5-S1 as detailed above. This is in addition to the impingement from bony retropulsion at the T12 vertebral level. 5. Right adrenal adenoma. 6.  Aortic Atherosclerosis (ICD10-I70.0). 7. Sigmoid colon diverticulosis. Electronically Signed   By: Van Clines M.D.   On: 10/11/2018 11:54   Dg C-arm 1-60 Min  Result Date:  10/19/2018 CLINICAL DATA:  Intraoperative localization for T10 through L5 fusion with T9 vertebroplasty. EXAM: DG C-ARM 61-120 MIN; LUMBAR SPINE - COMPLETE 4+ VIEW COMPARISON:  Prior CT from 10/13/2018. FINDINGS: Multiple spot intraoperative fluoroscopic PA and lateral views of the thoracolumbar spine demonstrate posterior fusion at T10 through L5 hardware appears grossly well position without adverse features. Vertebral augmentation at T9. IMPRESSION: Intraoperative fluoroscopic localization for T10-L5 fusion with T9 vertebral augmentation. Electronically Signed   By: Jeannine Boga M.D.   On: 10/19/2018 20:36   Dg C-arm 1-60 Min  Result Date: 10/19/2018 CLINICAL DATA:  Intraoperative localization for T10 through L5 fusion with T9 vertebroplasty. EXAM: DG C-ARM 61-120 MIN; LUMBAR SPINE - COMPLETE 4+ VIEW COMPARISON:  Prior CT from 10/13/2018. FINDINGS: Multiple spot intraoperative fluoroscopic PA and lateral views of the thoracolumbar spine demonstrate posterior fusion at T10 through L5 hardware appears grossly well position without adverse features. Vertebral augmentation at T9. IMPRESSION: Intraoperative fluoroscopic localization for T10-L5 fusion with T9 vertebral augmentation. Electronically Signed   By: Jeannine Boga M.D.   On: 10/19/2018 20:36     CBC Recent Labs  Lab 10/20/18 1158 10/20/18 2104  WBC 16.2* 13.6*  HGB 11.1* 9.3*  HCT 33.8* 29.1*  PLT 291 203  MCV 95.8 97.0  MCH 31.4 31.0  MCHC 32.8 32.0  RDW 15.5 15.7*    Chemistries  Recent Labs  Lab 10/16/18 0237 10/17/18 0426 10/18/18 0345 10/19/18 0322 10/20/18 1158  NA 125* 128* 129* 128* 128*  K 4.3 4.3 4.0 4.2 4.4  CL 97* 101 100 101 96*  CO2 22 21* 22 24 24   GLUCOSE 104* 92 90 98 128*  BUN 12 14 15 14 16   CREATININE 0.67 0.59 0.51 0.61 0.84  CALCIUM 7.8* 7.6* 7.8* 7.9* 7.8*   ------------------------------------------------------------------------------------------------------------------ No  results for input(s): CHOL, HDL, LDLCALC, TRIG, CHOLHDL, LDLDIRECT in the last 72 hours.  No results found for: HGBA1C ------------------------------------------------------------------------------------------------------------------ No results for input(s): TSH, T4TOTAL, T3FREE, THYROIDAB in the last 72 hours.  Invalid input(s): FREET3 ------------------------------------------------------------------------------------------------------------------ No results for input(s): VITAMINB12, FOLATE, FERRITIN, TIBC, IRON, RETICCTPCT in the last 72 hours.  Coagulation profile No results for input(s): INR, PROTIME in the last 168 hours.  No results for input(s): DDIMER in the last 72 hours.  Cardiac Enzymes No results for input(s): CKMB, TROPONINI, MYOGLOBIN in the last 168 hours.  Invalid input(s): CK ------------------------------------------------------------------------------------------------------------------ No results found for: BNP   Roxan Hockey M.D on 10/21/2018 at 6:51 PM  Pager---[phone] Go to [email or web address] - password [email or web address] for contact info  [facility] - Office  [phone]

## 2018-10-21 NOTE — Progress Notes (Signed)
CSW received notification from [facility] that we have insurance authorization.   [place], [place]

## 2018-10-22 DIAGNOSIS — D72829 Elevated white blood cell count, unspecified: Secondary | ICD-10-CM | POA: Diagnosis not present

## 2018-10-22 DIAGNOSIS — S32019D Unspecified fracture of first lumbar vertebra, subsequent encounter for fracture with routine healing: Secondary | ICD-10-CM | POA: Diagnosis not present

## 2018-10-22 DIAGNOSIS — L89152 Pressure ulcer of sacral region, stage 2: Secondary | ICD-10-CM | POA: Diagnosis not present

## 2018-10-22 DIAGNOSIS — S22080D Wedge compression fracture of T11-T12 vertebra, subsequent encounter for fracture with routine healing: Secondary | ICD-10-CM | POA: Diagnosis not present

## 2018-10-22 DIAGNOSIS — Z7401 Bed confinement status: Secondary | ICD-10-CM | POA: Diagnosis not present

## 2018-10-22 DIAGNOSIS — M2550 Pain in unspecified joint: Secondary | ICD-10-CM | POA: Diagnosis not present

## 2018-10-22 DIAGNOSIS — E871 Hypo-osmolality and hyponatremia: Secondary | ICD-10-CM | POA: Diagnosis not present

## 2018-10-22 DIAGNOSIS — W19XXXD Unspecified fall, subsequent encounter: Secondary | ICD-10-CM | POA: Diagnosis not present

## 2018-10-22 DIAGNOSIS — S22000A Wedge compression fracture of unspecified thoracic vertebra, initial encounter for closed fracture: Secondary | ICD-10-CM | POA: Diagnosis not present

## 2018-10-22 DIAGNOSIS — I700 Atherosclerosis of aorta: Secondary | ICD-10-CM | POA: Diagnosis not present

## 2018-10-22 DIAGNOSIS — R600 Localized edema: Secondary | ICD-10-CM | POA: Diagnosis not present

## 2018-10-22 DIAGNOSIS — M549 Dorsalgia, unspecified: Secondary | ICD-10-CM | POA: Diagnosis not present

## 2018-10-22 DIAGNOSIS — I10 Essential (primary) hypertension: Secondary | ICD-10-CM | POA: Diagnosis not present

## 2018-10-22 DIAGNOSIS — Y92009 Unspecified place in unspecified non-institutional (private) residence as the place of occurrence of the external cause: Secondary | ICD-10-CM

## 2018-10-22 DIAGNOSIS — Z4789 Encounter for other orthopedic aftercare: Secondary | ICD-10-CM | POA: Diagnosis not present

## 2018-10-22 DIAGNOSIS — W19XXXA Unspecified fall, initial encounter: Secondary | ICD-10-CM

## 2018-10-22 DIAGNOSIS — G8929 Other chronic pain: Secondary | ICD-10-CM | POA: Diagnosis not present

## 2018-10-22 DIAGNOSIS — I959 Hypotension, unspecified: Secondary | ICD-10-CM | POA: Diagnosis not present

## 2018-10-22 LAB — BASIC METABOLIC PANEL
Anion gap: 7 (ref 5–15)
BUN: 14 mg/dL (ref 8–23)
CO2: 24 mmol/L (ref 22–32)
Calcium: 7.6 mg/dL — ABNORMAL LOW (ref 8.9–10.3)
Chloride: 98 mmol/L (ref 98–111)
Creatinine, Ser: 0.67 mg/dL (ref 0.44–1.00)
GFR calc Af Amer: >60 mL/min (ref >60)
GFR calc non Af Amer: >60 mL/min (ref >60)
Glucose, Bld: 106 mg/dL — ABNORMAL HIGH (ref 70–99)
Potassium: 4.1 mmol/L (ref 3.5–5.1)
Sodium: 129 mmol/L — ABNORMAL LOW (ref 135–145)

## 2018-10-22 MED ORDER — ONDANSETRON HCL 4 MG PO TABS: 4.0000 mg | ORAL_TABLET | Freq: Four times a day (QID) | ORAL | 0 refills | Status: AC | PRN

## 2018-10-22 MED ORDER — OXYCODONE HCL 5 MG PO TABS: 10.0000 mg | ORAL_TABLET | ORAL | 0 refills | Status: DC | PRN

## 2018-10-22 MED ORDER — METHOCARBAMOL 500 MG PO TABS: 500.0000 mg | ORAL_TABLET | Freq: Four times a day (QID) | ORAL | 1 refills | Status: DC

## 2018-10-22 MED ORDER — ACETAMINOPHEN 325 MG PO TABS: 650.0000 mg | ORAL_TABLET | ORAL | 1 refills | Status: AC | PRN

## 2018-10-22 MED ORDER — SENNOSIDES-DOCUSATE SODIUM 8.6-50 MG PO TABS: 2.0000 | ORAL_TABLET | Freq: Two times a day (BID) | ORAL | 1 refills | Status: AC

## 2018-10-22 MED ORDER — SODIUM CHLORIDE 1 G PO TABS: 1.0000 g | ORAL_TABLET | Freq: Three times a day (TID) | ORAL | 0 refills | Status: DC

## 2018-10-22 MED FILL — Sodium Chloride IV Soln 0.9%: INTRAVENOUS | Qty: 1000 | Status: AC

## 2018-10-22 MED FILL — Heparin Sodium (Porcine) Inj 1000 Unit/ML: INTRAMUSCULAR | Qty: 30 | Status: AC

## 2018-10-22 NOTE — Progress Notes (Signed)
Physical Therapy Treatment Patient Details Name: Laurie Galloway MRN: [id number] DOB: 1930-01-03 Today's Date: 10/22/2018    History of Present Illness Pt is an 84 y/o female admitted secondary to worsening back pain. Pt with recent trip to ED and was found to have T12 compression fracture, and transverse process fracture at L1. Imaging also revelaed old compression fractures at L2-4. PMH includes HTN and lumbar surgery. Now, s/p Thoracic ten-Lumbar five posterior lateral fusion, T9 vertebroplasty    PT Comments    Patient seen for mobility progression. Patient requiring Min A for bed mobility and ModA for transfers and mobility with RW. Patient tendency to flex at hips with standing even with cueing for upright posturing. Will continue to recommend SNF.     Follow Up Recommendations  SNF;Supervision/Assistance - 24 hour     Equipment Recommendations  None recommended by PT(defer)    Recommendations for Other Services       Precautions / Restrictions Precautions Precautions: Back;Fall Required Braces or Orthoses: Spinal Brace Spinal Brace: Thoracolumbosacral orthotic;Applied in sitting position Restrictions Weight Bearing Restrictions: No    Mobility  Bed Mobility Overal bed mobility: Needs Assistance Bed Mobility: Rolling;Sidelying to Sit Rolling: Min assist Sidelying to sit: Min assist       General bed mobility comments: Min A to come to sit at EOB - cueing on safety and sequencing  Transfers Overall transfer level: Needs assistance Equipment used: Rolling walker (2 wheeled) Transfers: Sit to/from Stand Sit to Stand: Mod assist;+2 safety/equipment         General transfer comment: Mod A +2 to power up from bedside and recliner x 2 - cueing on hand placement; poor upright posturing with standing  Ambulation/Gait Ambulation/Gait assistance: Mod assist;+2 physical assistance;+2 safety/equipment Gait Distance (Feet): (8 feet, 2 feet) Assistive device: Rolling  walker (2 wheeled) Gait Pattern/deviations: Step-to pattern;Decreased step length - right;Decreased step length - left;Trunk flexed     General Gait Details: Mod A +2 for safety; step to pattern; very unsteady; quickly sits   Chief Strategy Officer    Modified Rankin (Stroke Patients Only)       Balance Overall balance assessment: Needs assistance Sitting-balance support: Bilateral upper extremity supported;Feet supported Sitting balance-Leahy Scale: Fair     Standing balance support: Bilateral upper extremity supported;During functional activity Standing balance-Leahy Scale: Poor Standing balance comment: Heavy reliance on UE's/RW                            Cognition Arousal/Alertness: Awake/alert Behavior During Therapy: WFL for tasks assessed/performed Overall Cognitive Status: Within Functional Limits for tasks assessed                                        Exercises      General Comments        Pertinent Vitals/Pain Pain Assessment: Faces Faces Pain Scale: Hurts a little bit Pain Location: back Pain Descriptors / Indicators: Aching;Sore Pain Intervention(s): Limited activity within patient's tolerance;Monitored during session;Repositioned    Home Living                      Prior Function            PT Goals (current goals can now be found in the care plan section) Acute  Rehab PT Goals Patient Stated Goal: rehab then home PT Goal Formulation: With patient Potential to Achieve Goals: Fair Progress towards PT goals: Progressing toward goals    Frequency    Min 2X/week      PT Plan Current plan remains appropriate    Co-evaluation              AM-PAC PT "6 Clicks" Daily Activity  Outcome Measure  Difficulty turning over in bed (including adjusting bedclothes, sheets and blankets)?: A Lot Difficulty moving from lying on back to sitting on the side of the bed? :  Unable Difficulty sitting down on and standing up from a chair with arms (e.g., wheelchair, bedside commode, etc,.)?: Unable Help needed moving to and from a bed to chair (including a wheelchair)?: A Lot Help needed walking in hospital room?: A Lot Help needed climbing 3-5 steps with a railing? : Total 6 Click Score: 9    End of Session Equipment Utilized During Treatment: Gait belt;Back brace Activity Tolerance: Patient tolerated treatment well Patient left: in chair;with call bell/phone within reach;with chair alarm set;with nursing/sitter in room Nurse Communication: Mobility status PT Visit Diagnosis: Unsteadiness on feet (R26.81);Repeated falls (R29.6);Muscle weakness (generalized) (M62.81);History of falling (Z91.81);Pain Pain - part of body: (back)     Time: 1340-1406 PT Time Calculation (min) (ACUTE ONLY): 26 min  Charges:  $Gait Training: 8-22 mins $Therapeutic Activity: 8-22 mins                    Lanney Gins, PT, DPT Supplemental Physical Therapist 10/22/18 2:49 PM Pager: [phone] Office: [phone]

## 2018-10-22 NOTE — Progress Notes (Signed)
IP rehab admissions - Noted patient is planning to go to [facility] for her rehab.  Please see rehab consult done by Dr. Posey Pronto yesterday.  I will sign off for acute inpatient rehab at this time.  Call me for questions.  [phone]

## 2018-10-22 NOTE — Plan of Care (Signed)
  Problem: Activity: Goal: Risk for activity intolerance will decrease Outcome: Progressing   Problem: Nutrition: Goal: Adequate nutrition will be maintained Outcome: Progressing   Problem: Elimination: Goal: Will not experience complications related to bowel motility Outcome: Progressing   Problem: Pain Managment: Goal: General experience of comfort will improve Outcome: Progressing   Problem: Skin Integrity: Goal: Risk for impaired skin integrity will decrease Outcome: Progressing   

## 2018-10-22 NOTE — Progress Notes (Addendum)
Post op 3   Pain better controlled.  Participating with therapy. No radiating pain or numbness.  Strength improved.  Afebrile. BP and HR good. UOP good  Motor and sens Fxn intact.  Wound CDI. Abd soft (NO BM yet)  Drain out   Rehab says pt needs SNF  Plan to work toward [place]

## 2018-10-22 NOTE — Progress Notes (Signed)
Report was called to [place].  Report given to [facility] (nurse)  Patient will be transferred via [facility] to that facility.  The patient's daughter will meet the patient at the facility.  Discharge instructions and DNR record sent.

## 2018-10-22 NOTE — Clinical Social Work Placement (Signed)
   CLINICAL SOCIAL WORK PLACEMENT  NOTE  Date:  10/22/2018  Patient Details  Name: Laurie Galloway MRN: [id number] Date of Birth: [birth year]  Clinical Social Work is seeking post-discharge placement for this patient at the [place] level of care (*CSW will initial, date and re-position this form in  chart as items are completed):      Patient/family provided with [place] Work Department's list of facilities offering this level of care within the geographic area requested by the patient (or if unable, by the patient's family).      Patient/family informed of their freedom to choose among providers that offer the needed level of care, that participate in Medicare, Medicaid or managed care program needed by the patient, have an available bed and are willing to accept the patient.  Yes   Patient/family informed of [facility]'s ownership interest in [facility] and [facility], as well as of the fact that they are under no obligation to receive care at these facilities.  PASRR submitted to EDS on       PASRR number received on 10/13/18     Existing PASRR number confirmed on       FL2 transmitted to all facilities in geographic area requested by pt/family on 10/13/18     FL2 transmitted to all facilities within larger geographic area on 10/13/18     Patient informed that his/her managed care company has contracts with or will negotiate with certain facilities, including the following:        Yes   Patient/family informed of bed offers received.  Patient chooses bed at [place], [name]     Physician recommends and patient chooses bed at      Patient to be transferred to [place] [facility] on 10/22/18.  Patient to be transferred to facility by [facility]     Patient family notified on 10/22/18 of transfer.  Name of family member notified:  Annette     PHYSICIAN       Additional Comment:     _______________________________________________ Alberteen Sam, LCSW 10/22/2018, 1:34 PM

## 2018-10-22 NOTE — Progress Notes (Signed)
Patient will DC to: [facility] Anticipated DC date: 10/22/2018 Family notified: Anne Ng Transport by: Corey Harold  Per MD patient ready for DC to [facility]. RN, patient, patient's family, and facility notified of DC. Discharge Summary sent to facility. RN given number for report [phone]. DC packet on chart. Ambulance transport requested for patient.  CSW signing off.  [place], [place]

## 2018-10-22 NOTE — Discharge Summary (Signed)
Laurie Laurie Galloway, is a 84 y.o. female  DOB 1930-03-03  MRN [id number].  Admission date:  10/12/2018  Admitting Physician  Rise Patience, MD  Discharge Date:  10/22/2018   Primary MD  Mayra Neer, MD  Recommendations for primary care physician for things to follow:   1)Restrict Free water to 750 Ml/day,  Limit all your Fluid  intake to no more than 50 ounces (1.5 Liters) per day 2)Weigh yourself daily, call if you gain more than 3 pounds in 1 day or more than 5 pounds in 1 week  3) Use back brace when upright or ambulating, activity limitations and restrictions as advised by her neurosurgeon and your physical therapist 4) repeat BMP and CBC blood test within the next 5 to 7 days     Admission Diagnosis  Compression fracture of T12 vertebra, initial encounter ([place]) [S22.080A] Back pain [M54.9]  Discharge Diagnosis  Compression fracture of T12 vertebra, initial encounter ([place]) [S22.080A] Back pain [M54.9]    Principal Problem:   Compression fracture of body of thoracic vertebra (HCC) Active Problems:   HTN (hypertension)   Back pain   Pathologic thoracic fracture, initial encounter   Hyponatremia   Aortic atherosclerosis (HCC)   Compression fracture of T12 vertebra (HCC)   Acute blood loss anemia   Postoperative pain   Leukocytosis   Fall at home, initial encounter      Past Medical History:  Diagnosis Date  . Aortic atherosclerosis ([place]) 10/17/2018  . Arthritis    hands  . Carpal tunnel syndrome of left wrist 08/2016  . Dental crowns present   . Family history of adverse reaction to anesthesia    pt's daughter has hx. of post-op N/V  . Hypertension    states under control with meds., has been on med. x "long time"  . PONV (postoperative nausea and vomiting)   . White coat hypertension     Past Surgical History:  Procedure Laterality Date  . ANTERIOR AND POSTERIOR VAGINAL  REPAIR  11/08/2002  . APPENDECTOMY    . BOWEL RESECTION  01/08/2012   Procedure: SMALL BOWEL RESECTION;  Surgeon: Imogene Burn. Georgette Dover, MD;  Location: [facility];  Service: General;  Laterality: N/A;  . CARPAL TUNNEL RELEASE Left 08/29/2016   Procedure: CARPAL TUNNEL RELEASE, left;  Surgeon: Leanora Cover, MD;  Location: [facility];  Service: Orthopedics;  Laterality: Left;  . CATARACT EXTRACTION W/ INTRAOCULAR LENS IMPLANT Left 01/09/2011  . CATARACT EXTRACTION W/ INTRAOCULAR LENS IMPLANT Right 03/06/2011  . COLONOSCOPY WITH PROPOFOL N/A 08/24/2013   Procedure: COLONOSCOPY WITH PROPOFOL;  Surgeon: Garlan Fair, MD;  [facility];  Service: Endoscopy;  Laterality: N/A;  . ESOPHAGOGASTRODUODENOSCOPY (EGD) WITH PROPOFOL N/A 08/24/2013   Procedure: ESOPHAGOGASTRODUODENOSCOPY (EGD) WITH PROPOFOL;  Surgeon: Garlan Fair, MD;  [facility];  Service: Endoscopy;  Laterality: N/A;  . EXTERNAL EAR SURGERY Bilateral    stapedectomy   . INTRAMEDULLARY (IM) NAIL INTERTROCHANTERIC Left 10/14/2017   Procedure: INTRAMEDULLARY (IM) NAIL INTERTROCHANTRIC;  Surgeon:  Paralee Cancel, MD;  [facility];  Service: Orthopedics;  Laterality: Left;  . LAMINECTOMY WITH POSTERIOR LATERAL ARTHRODESIS LEVEL 4 N/A 10/19/2018   Procedure: Thoracic ten-Lumbar five posterior lateral fusion with pedicle screws, Local autograft and allograft, Thoracic eleven and Thoracic twelve laminectomy with transpedicular fracture reduction;  Surgeon: Earnie Larsson, MD;  Location: [facility];  Service: Neurosurgery;  Laterality: N/A;  . LAPAROSCOPIC ASSISTED VAGINAL HYSTERECTOMY  11/08/2002  . LAPAROSCOPIC BILATERAL SALPINGO OOPHERECTOMY Bilateral 11/08/2002  . LAPAROSCOPIC LYSIS OF ADHESIONS  11/08/2002  . LAPAROTOMY  01/08/2012   Procedure: EXPLORATORY LAPAROTOMY;  Surgeon: Imogene Burn. Georgette Dover, MD;  [facility];  Service: General;  Laterality: N/A;  . LUMBAR FUSION  03/19/2010   L3-4, L4-5  . LUMBAR  LAMINECTOMY/DECOMPRESSION MICRODISCECTOMY  03/19/2010   L3-4, L4-5  . SHOULDER ARTHROSCOPY W/ ROTATOR CUFF REPAIR    . TONSILLECTOMY     age 63  . VERTEBROPLASTY N/A 10/19/2018   Procedure: Thoracic nine prophylactic vertebroplasty;  Surgeon: Earnie Larsson, MD;  Location: [facility];  Service: Neurosurgery;  Laterality: N/A;       HPI  from the history and physical done on the day of admission:    Chief Complaint: Low back pain difficulty to walk.  HPI: Laurie Laurie Galloway is a 84 y.o. female with history of hypertension has been experiencing worsening low back pain over the last 3 weeks.  Patient states she had a fall towards the end of last week of August 3 months ago.  Since then patient has been following with orthopedics and has got prednisone tapering dose recently.  Despite taking which patient's pain has been worsening.  Patient's pain acutely worsened 3 weeks ago with pain radiating from low back to her both thighs.  No incontinence of urine.  Patient has noticed increasingly difficulty walking.  Patient had some to the ER yesterday and had CT of the lumbar spine done which showed acute fracture of the T12 and transverse process of L1.  At that time ER physician I discussed with on-call neurosurgeon who advised T SLO brace and follow-up with Dr. Trenton Gammon at home patient has had previous surgery.  Despite which patient's pain has not improved and has come back to the ER today.  ED Course: On exam patient is not in distress.  Complains of significant pain on minimal movement.  Was placed on hydrocodone 1 dose and ER physician discussed with on-call neurosurgeon who advised patient to be admitted and Dr. Trenton Gammon to be consulted in the morning.     Hospital Course:    Brief Narrative:  84 year old with past medical history relevant for osteoporosis, hypertension, chronic back pain admitted on 10/12/2018 with acute on chronic back pain and difficulty ambulating with evidence of acute T12 fracture and L1  transverse process fracture with  neurosurgical and Interventional radiologytreatment with course complicatedby hyponatremia.    Plan:- 1) Hyponatremia: Stable and not profoundly low. -Despite instructions to avoid free water patient continues to drink excessive amount of free water even while in the hospital, she is again reminded to be compliant with free water restriction-Continue salt tablets 1 g 3 times daily with meals Restrict Free water to 750 Ml/day,  Limit all your Fluid  intake to no more than 50 ounces (1.5 Liters) per day Repeat BMP within the next 5 to 7 days   2) Acute T12/L1 fracture: Likely secondary mechanical fall in a patient with osteoporosis, status post neurosurgical repair on 10/19/2018 by Dr. Earnie Larsson- Pt underwent T11-T12  decompressive laminectomy with left T12 transpedicular reduction of fracture,,,T10-T11-T12 L1-L2 L3-L4-L5 posterior lateral arthrodesis utilizing locally harvested autograft, morselized allograft and segmental pedicle screw fixation Methylmethacrylate augmentation of T10-T11 and L1 pedicle screws bilaterally,,,Prophylactic T9 methylmethacrylate vertebroplasty.  Use back brace when upright or ambulating, activity limitations and restrictions as advised by her neurosurgeon and your physical therapist -Continue pain control  3) Hypertension: Stable, okay to discharge on verapamil and losartan  4)) Osteoporosis:--May resume denosumab as outpatient every 6 months -Continue home vitamin D and calcium tablets   Code Status- DNR   Discharge Condition: stable  Follow UP--- neurosurgical follow-up as advised by neurosurgical team   Consults obtained -neurosurgery  Diet and Activity recommendation:  As advised  Discharge Instructions    Discharge Instructions    Call MD for:  difficulty breathing, headache or visual disturbances   Complete by:  As directed    Call MD for:  persistant dizziness or light-headedness   Complete by:  As  directed    Call MD for:  persistant nausea and vomiting   Complete by:  As directed    Call MD for:  severe uncontrolled pain   Complete by:  As directed    Call MD for:  temperature >100.4   Complete by:  As directed    Diet general   Complete by:  As directed    1)Restrict Free water to 750 Ml/day,  Limit all your Fluid  intake to no more than 50 ounces (1.5 Liters) per day   Discharge instructions   Complete by:  As directed    1)Restrict Free water to 750 cc/day,  Limit all your Fluid  Intake (including water) to no more than 50 ounces (1.5 Liters) per day 2)Weigh yourself daily, call if you gain more than 3 pounds in 1 day or more than 5 pounds in 1 week  3) activity limitations and restrictions as advised by her neurosurgeon and physical therapist 4) repeat BMP and CBC blood test within the next 5 to 7 days   Increase activity slowly   Complete by:  As directed    Use back brace when upright or ambulating, activity limitations and restrictions as advised by her neurosurgeon and your physical therapist       Discharge Medications     Allergies as of 10/22/2018      Reactions   Penicillins Hives, Rash   Many years ago Has patient had a PCN reaction causing immediate rash, facial/tongue/throat swelling, SOB or lightheadedness with hypotension: No Has patient had a PCN reaction causing severe rash involving mucus membranes or skin necrosis: No Has patient had a PCN reaction that required hospitalization: No Has patient had a PCN reaction occurring within the last 10 years: No If all of the above answers are "NO", then may proceed with Cephalosporin use.   Sulfa Antibiotics Rash      Medication List    STOP taking these medications   HYDROcodone-acetaminophen 5-325 MG tablet Commonly known as:  NORCO/VICODIN   meloxicam 15 MG tablet Commonly known as:  MOBIC   polyethylene glycol packet Commonly known as:  MIRALAX / GLYCOLAX   traMADol 50 MG tablet Commonly known  as:  ULTRAM     TAKE these medications   acetaminophen 325 MG tablet Commonly known as:  TYLENOL Take 2 tablets (650 mg total) by mouth every 4 (four) hours as needed for mild pain, fever or headache ((score 1 to 3) or temp > 100.5). What changed:  medication strength  how much to take  when to take this  reasons to take this   betamethasone valerate 0.1 % cream Commonly known as:  VALISONE Apply 1 application topically 2 (two) times daily as needed (itching).   CALCIUM+D3 600-800 MG-UNIT Tabs Generic drug:  Calcium Carb-Cholecalciferol Take 1 tablet by mouth 2 (two) times daily with a meal.   clobetasol cream 0.05 % Commonly known as:  TEMOVATE Apply 1 application topically 2 (two) times daily.   denosumab 60 MG/ML Sosy injection Commonly known as:  PROLIA Inject 60 mg into the skin every 6 (six) months.   FISH OIL PO Take 1 capsule by mouth daily.   GLUCOSAMINE CHONDROITIN TRIPLE PO Take 1 capsule by mouth 2 (two) times daily.   losartan 100 MG tablet Commonly known as:  COZAAR Take 100 mg by mouth every morning.   methocarbamol 500 MG tablet Commonly known as:  ROBAXIN Take 1 tablet (500 mg total) by mouth 4 (four) times daily. What changed:  when to take this   multivitamin tablet Take 1 tablet by mouth daily with supper.   ondansetron 4 MG tablet Commonly known as:  ZOFRAN Take 1 tablet (4 mg total) by mouth every 6 (six) hours as needed for nausea or vomiting.   oxyCODONE 5 MG immediate release tablet Commonly known as:  Oxy IR/ROXICODONE Take 2 tablets (10 mg total) by mouth every 3 (three) hours as needed for moderate pain or severe pain ((score 7 to 10)).   PRESERVISION AREDS 2 PO Take 1 tablet by mouth 2 (two) times daily.   senna-docusate 8.6-50 MG tablet Commonly known as:  Senokot-S Take 2 tablets by mouth 2 (two) times daily.   sodium chloride 1 g tablet Take 1 tablet (1 g total) by mouth 3 (three) times daily with meals.   SYSTANE  0.4-0.3 % Soln Generic drug:  Polyethyl Glycol-Propyl Glycol Place 1 drop into both eyes 4 (four) times daily as needed (dry eyes).   verapamil 360 MG 24 hr capsule Commonly known as:  VERELAN PM Take 360 mg by mouth every morning.   Vitamin D 50 MCG (2000 UT) tablet Take 2,000 Units by mouth daily.            Durable Medical Equipment  (From admission, onward)         Start     Ordered   10/19/18 2002  DME Walker rolling  Once    Question:  Patient needs a walker to treat with the following condition  Answer:  T12 burst fracture ([place])   10/19/18 2001   10/19/18 2002  DME 3 n 1  Once     10/19/18 2001          Major procedures and Radiology Reports - PLEASE review detailed and final reports for all details, in brief -    Dg Lumbar Spine Complete  Result Date: 10/19/2018 CLINICAL DATA:  Intraoperative localization for T10 through L5 fusion with T9 vertebroplasty. EXAM: DG C-ARM 61-120 MIN; LUMBAR SPINE - COMPLETE 4+ VIEW COMPARISON:  Prior CT from 10/13/2018. FINDINGS: Multiple spot intraoperative fluoroscopic PA and lateral views of the thoracolumbar spine demonstrate posterior fusion at T10 through L5 hardware appears grossly well position without adverse features. Vertebral augmentation at T9. IMPRESSION: Intraoperative fluoroscopic localization for T10-L5 fusion with T9 vertebral augmentation. Electronically Signed   By: Jeannine Boga M.D.   On: 10/19/2018 20:36   Ct Thoracic Spine Wo Contrast  Result Date: 10/13/2018 CLINICAL DATA:  Thoracic vertebral body fracture osteoporosis. EXAM: CT THORACIC SPINE WITHOUT CONTRAST TECHNIQUE: Multidetector CT images of the thoracic were obtained using the standard protocol without intravenous contrast. COMPARISON:  Same day lumbar spine CT FINDINGS: Alignment: Maintained Vertebrae: 3 column fracture of T12 with 90% height loss and 10 mm of retropulsion contributing to at least 50% luminal narrowing of the central canal.  Osteopenic appearance of the remainder of the thoracic vertebrae with striated vertical trabecular markings. No aggressive osseous lesions or additional fractures involving thoracic spine. Acute nondisplaced left L1 transverse process fracture, series 5/56. Healed medial right eleventh rib fracture with callus, series 5/127. Paraspinal and other soft tissues: Atherosclerosis of the aortic arch and descending thoracic aorta without aneurysm. Coarse rounded 12 mm calcification at the base of the neck on the right, nonspecific possibly vascular in etiology or associated with the right thyroid. No paraspinal soft tissue mass or hemorrhage. No adenopathy. Hypodense right adrenal nodule consistent with an adenoma. Mild peribronchial thickening and perihilar as well as bibasilar atelectasis. Disc levels: No significant disc protrusions of the thoracic spine. Cervical spondylosis with multilevel degenerative disc disease from the included C5 inferior endplate through T1. IMPRESSION: 1. 3 column fracture of T12 with 90% height loss and 10 mm of retropulsion contributing to at least 50% luminal narrowing of the central canal. 2. Acute nondisplaced left L1 transverse process fracture. 3. Healed medial right eleventh rib fracture with callus. 4. Right adrenal adenoma. 5. Degenerative disc disease of the included lower cervical spine from C5 through T1. 6. Osteopenic appearance of the thoracic spine with accentuated vertical trabecular markings noted. Aortic Atherosclerosis (ICD10-I70.0). Electronically Signed   By: Ashley Royalty M.D.   On: 10/13/2018 21:57   Ct Lumbar Spine Wo Contrast  Result Date: 10/11/2018 CLINICAL DATA:  Fall 6 weeks ago.  Back pain. EXAM: CT LUMBAR SPINE WITHOUT CONTRAST TECHNIQUE: Multidetector CT imaging of the lumbar spine was performed without intravenous contrast administration. Multiplanar CT image reconstructions were also generated. COMPARISON:  07/28/2018 radiographs FINDINGS: Segmentation:  The lowest lumbar type non-rib-bearing vertebra is labeled as L5. Alignment: 9 mm anterolisthesis at L3-4, chronic. 9 mm anterolisthesis at L4-5, chronic. Vertebrae: Severe compression fracture at T12 with 90% loss of height including a horizontal component, an oblique component extending to both superior and inferior endplates, with fracture extending into the bases of both pedicles but no well-defined fracture involving the lamina or spinous process. There is 11 mm of posterior bony retropulsion at this level, narrowing the AP diameter of the thecal sac to 8 mm compatible with moderate central narrowing of the thecal sac the bony retropulsion is mildly eccentric to the left. Acute left transverse process fracture at L1. Compression fractures at L2, L3, and L4 are roughly similar to the 07/28/2018 exam. Posterolateral rod and pedicle screw fixation at L3-L4-L5. Paraspinal and other soft tissues: Aortoiliac atherosclerotic vascular disease. 3.1 by 1.8 cm right adrenal adenoma. Sigmoid colon diverticulosis. Disc levels: As noted above there is at least moderate central narrowing of the thecal sac at the T12 level and potentially some mild right foraminal stenosis due to spurring at T11-12. L1-2: No impingement.  Disc bulge. L2-3: Prominent left and moderate right foraminal stenosis due to disc bulge and facet and intervertebral spurring. L3-4: Suspected mild left foraminal stenosis due to spurring and subluxation. L4-5: Unremarkable.  Posterior decompression. L5-S1: Mild bilateral foraminal stenosis due to intervertebral spurring and disc bulge. IMPRESSION: 1. The dominant finding is a severe compression fracture at T12 involving all 3  columns, with 90% loss of height and 11 mm of posterior bony retropulsion leading to at least moderate central narrowing of the thecal sac. There fractures through the pedicles as well as both transverse and oblique fractures through the vertebral body. This is new compared to  07/28/2018. 2. Acute left transverse process fracture at L1. 3. Old compression fractures at L2, L3, and L4 with posterolateral rod and pedicle screw fixation and posterior decompression. 4. Lumbar spondylosis and degenerative disc disease causing prominent impingement at L2-3, and mild impingement at L3-4 and L5-S1 as detailed above. This is in addition to the impingement from bony retropulsion at the T12 vertebral level. 5. Right adrenal adenoma. 6.  Aortic Atherosclerosis (ICD10-I70.0). 7. Sigmoid colon diverticulosis. Electronically Signed   By: Van Clines M.D.   On: 10/11/2018 11:54   Dg C-arm 1-60 Min  Result Date: 10/19/2018 CLINICAL DATA:  Intraoperative localization for T10 through L5 fusion with T9 vertebroplasty. EXAM: DG C-ARM 61-120 MIN; LUMBAR SPINE - COMPLETE 4+ VIEW COMPARISON:  Prior CT from 10/13/2018. FINDINGS: Multiple spot intraoperative fluoroscopic PA and lateral views of the thoracolumbar spine demonstrate posterior fusion at T10 through L5 hardware appears grossly well position without adverse features. Vertebral augmentation at T9. IMPRESSION: Intraoperative fluoroscopic localization for T10-L5 fusion with T9 vertebral augmentation. Electronically Signed   By: Jeannine Boga M.D.   On: 10/19/2018 20:36   Dg C-arm 1-60 Min  Result Date: 10/19/2018 CLINICAL DATA:  Intraoperative localization for T10 through L5 fusion with T9 vertebroplasty. EXAM: DG C-ARM 61-120 MIN; LUMBAR SPINE - COMPLETE 4+ VIEW COMPARISON:  Prior CT from 10/13/2018. FINDINGS: Multiple spot intraoperative fluoroscopic PA and lateral views of the thoracolumbar spine demonstrate posterior fusion at T10 through L5 hardware appears grossly well position without adverse features. Vertebral augmentation at T9. IMPRESSION: Intraoperative fluoroscopic localization for T10-L5 fusion with T9 vertebral augmentation. Electronically Signed   By: Jeannine Boga M.D.   On: 10/19/2018 20:36   Micro Results     Recent Results (from the past 240 hour(s))  Surgical pcr screen     Status: None   Collection Time: 10/15/18 10:45 AM  Result Value Ref Range Status   MRSA, PCR NEGATIVE NEGATIVE Final   Staphylococcus aureus NEGATIVE NEGATIVE Final    Comment: (NOTE) The Xpert SA Assay (FDA approved for NASAL specimens in patients 23 years of age and older), is one component of a comprehensive surveillance program. It is not intended to diagnose infection nor to guide or monitor treatment. Performed at [facility], [place] [address], [place], [place] [address]    Today   Subjective    Joliyah Lippens today has no new complaints, pain control is better, no chest pains no shortness of breath no palpitation       Patient has been seen and examined prior to discharge   Objective   Blood pressure 140/67, pulse 85, temperature 98.5 F (36.9 C), temperature source Oral, resp. rate 18, height 5\' 3"  (1.6 m), weight 67.7 kg, SpO2 98 %.   Intake/Output Summary (Last 24 hours) at 10/22/2018 1305 Last data filed at 10/22/2018 0656 Gross per 24 hour  Intake 720 ml  Output 300 ml  Net 420 ml    Exam Gen:- Awake Alert, no acute distress  HEENT:- Uncertain.AT, No sclera icterus Neck-Supple Neck,No JVD,.  Lungs-  CTAB , good air movement bilaterally  CV- S1, S2 normal, regular Abd-  +ve B.Sounds, Abd Soft, No tenderness,    Extremity/Skin:- No  edema,  good pulses Psych-affect is appropriate, oriented x3 Neuro-no new focal deficits, no tremors  MSK-postoperative thoracic spine area wound clean dry and intact   Data Review   CBC w Diff:  Lab Results  Component Value Date   WBC 13.6 (H) 10/20/2018   HGB 9.3 (L) 10/20/2018   HCT 29.1 (L) 10/20/2018   PLT 203 10/20/2018   LYMPHOPCT 10 10/13/2017   MONOPCT 8 10/13/2017   EOSPCT 1 10/13/2017   BASOPCT 0 10/13/2017    CMP:  Lab Results  Component Value Date   NA 129 (L) 10/22/2018   K 4.1 10/22/2018   CL 98 10/22/2018   CO2 24 10/22/2018    BUN 14 10/22/2018   CREATININE 0.67 10/22/2018   PROT 6.6 04/11/2012   ALBUMIN 3.5 10/14/2017   BILITOT 0.7 04/11/2012   ALKPHOS 65 04/11/2012   AST 28 04/11/2012   ALT 19 04/11/2012  .   Total Discharge time is about 33 minutes  Roxan Hockey M.D on 10/22/2018 at 1:05 PM  Pager---[phone]  Go to [email or web address] - password [email or web address] for contact info  [facility] - Office  [phone]

## 2018-10-27 DIAGNOSIS — L89152 Pressure ulcer of sacral region, stage 2: Secondary | ICD-10-CM | POA: Diagnosis not present

## 2018-11-01 DIAGNOSIS — D72829 Elevated white blood cell count, unspecified: Secondary | ICD-10-CM | POA: Diagnosis not present

## 2018-11-01 DIAGNOSIS — E871 Hypo-osmolality and hyponatremia: Secondary | ICD-10-CM | POA: Diagnosis not present

## 2018-11-01 DIAGNOSIS — R600 Localized edema: Secondary | ICD-10-CM | POA: Diagnosis not present

## 2018-11-01 DIAGNOSIS — I10 Essential (primary) hypertension: Secondary | ICD-10-CM | POA: Diagnosis not present

## 2018-11-03 DIAGNOSIS — L89152 Pressure ulcer of sacral region, stage 2: Secondary | ICD-10-CM | POA: Diagnosis not present

## 2018-11-08 DIAGNOSIS — R600 Localized edema: Secondary | ICD-10-CM | POA: Diagnosis not present

## 2018-11-08 DIAGNOSIS — E871 Hypo-osmolality and hyponatremia: Secondary | ICD-10-CM | POA: Diagnosis not present

## 2018-11-10 DIAGNOSIS — L89152 Pressure ulcer of sacral region, stage 2: Secondary | ICD-10-CM | POA: Diagnosis not present

## 2018-11-17 DIAGNOSIS — L89152 Pressure ulcer of sacral region, stage 2: Secondary | ICD-10-CM | POA: Diagnosis not present

## 2018-11-19 DIAGNOSIS — M810 Age-related osteoporosis without current pathological fracture: Secondary | ICD-10-CM | POA: Diagnosis not present

## 2018-11-19 DIAGNOSIS — M5134 Other intervertebral disc degeneration, thoracic region: Secondary | ICD-10-CM | POA: Diagnosis not present

## 2018-11-19 DIAGNOSIS — M50321 Other cervical disc degeneration at C4-C5 level: Secondary | ICD-10-CM | POA: Diagnosis not present

## 2018-11-19 DIAGNOSIS — S22080D Wedge compression fracture of T11-T12 vertebra, subsequent encounter for fracture with routine healing: Secondary | ICD-10-CM | POA: Diagnosis not present

## 2018-11-19 DIAGNOSIS — M19041 Primary osteoarthritis, right hand: Secondary | ICD-10-CM | POA: Diagnosis not present

## 2018-11-19 DIAGNOSIS — M5124 Other intervertebral disc displacement, thoracic region: Secondary | ICD-10-CM | POA: Diagnosis not present

## 2018-11-19 DIAGNOSIS — S32019D Unspecified fracture of first lumbar vertebra, subsequent encounter for fracture with routine healing: Secondary | ICD-10-CM | POA: Diagnosis not present

## 2018-11-19 DIAGNOSIS — I10 Essential (primary) hypertension: Secondary | ICD-10-CM | POA: Diagnosis not present

## 2018-11-19 DIAGNOSIS — M19042 Primary osteoarthritis, left hand: Secondary | ICD-10-CM | POA: Diagnosis not present

## 2018-11-20 ENCOUNTER — Other Ambulatory Visit: Payer: Self-pay

## 2018-11-20 DIAGNOSIS — H353223 Exudative age-related macular degeneration, left eye, with inactive scar: Secondary | ICD-10-CM | POA: Diagnosis not present

## 2018-11-20 DIAGNOSIS — H35372 Puckering of macula, left eye: Secondary | ICD-10-CM | POA: Diagnosis not present

## 2018-11-20 DIAGNOSIS — S22080D Wedge compression fracture of T11-T12 vertebra, subsequent encounter for fracture with routine healing: Secondary | ICD-10-CM | POA: Diagnosis not present

## 2018-11-20 DIAGNOSIS — M810 Age-related osteoporosis without current pathological fracture: Secondary | ICD-10-CM | POA: Diagnosis not present

## 2018-11-20 DIAGNOSIS — I10 Essential (primary) hypertension: Secondary | ICD-10-CM | POA: Diagnosis not present

## 2018-11-20 DIAGNOSIS — M19042 Primary osteoarthritis, left hand: Secondary | ICD-10-CM | POA: Diagnosis not present

## 2018-11-20 DIAGNOSIS — H353211 Exudative age-related macular degeneration, right eye, with active choroidal neovascularization: Secondary | ICD-10-CM | POA: Diagnosis not present

## 2018-11-20 DIAGNOSIS — H35421 Microcystoid degeneration of retina, right eye: Secondary | ICD-10-CM | POA: Diagnosis not present

## 2018-11-20 DIAGNOSIS — M19041 Primary osteoarthritis, right hand: Secondary | ICD-10-CM | POA: Diagnosis not present

## 2018-11-20 DIAGNOSIS — S32019D Unspecified fracture of first lumbar vertebra, subsequent encounter for fracture with routine healing: Secondary | ICD-10-CM | POA: Diagnosis not present

## 2018-11-20 NOTE — Patient Outreach (Signed)
[facility]) Care Management  11/20/2018  ANEYA DADDONA [birth year] [id number]   Referral Date: 11/20/18 Referral Source: [employer] Report Date of Admission: 10/23/18 Diagnosis: Compression fracture Date of Discharge: 11/18/18 Facility:  [place]:  [place] attempt: spoke with daughter Anne Ng.  She is able to verify HIPAA.  She verifies that patient left the nurse center on Wednesday.  She states that patient is doing well.  Daughter in the home assisting patient.  Patient currently working with the therapist from [place].  Patient taking medications and daughter has no questions or concerns with medications.  Daughter is unable to review medications and finish assessment.  Daughter declines any present needs for services at this time.     Plan: RN CM will close case.     Jone Baseman, RN, MSN [facility] Care Management Care Management Coordinator Direct Line [phone] Toll Free: [phone]  Fax: [phone]

## 2018-11-23 ENCOUNTER — Other Ambulatory Visit: Payer: Self-pay

## 2018-11-23 DIAGNOSIS — I10 Essential (primary) hypertension: Secondary | ICD-10-CM | POA: Diagnosis not present

## 2018-11-23 DIAGNOSIS — M19041 Primary osteoarthritis, right hand: Secondary | ICD-10-CM | POA: Diagnosis not present

## 2018-11-23 DIAGNOSIS — M19042 Primary osteoarthritis, left hand: Secondary | ICD-10-CM | POA: Diagnosis not present

## 2018-11-23 DIAGNOSIS — S22080D Wedge compression fracture of T11-T12 vertebra, subsequent encounter for fracture with routine healing: Secondary | ICD-10-CM | POA: Diagnosis not present

## 2018-11-23 DIAGNOSIS — M810 Age-related osteoporosis without current pathological fracture: Secondary | ICD-10-CM | POA: Diagnosis not present

## 2018-11-23 DIAGNOSIS — S32019D Unspecified fracture of first lumbar vertebra, subsequent encounter for fracture with routine healing: Secondary | ICD-10-CM | POA: Diagnosis not present

## 2018-11-23 NOTE — Patient Outreach (Signed)
Patient triggered Red on EMMI General Discharge Dashboard, notification sent to:  Jon Billings, RN

## 2018-11-23 NOTE — Patient Outreach (Signed)
[place] [facility]) Care Management  11/23/2018  Laurie Galloway 06-Feb-1930 [id number]   EMMI- General Discharge RED ON EMMI ALERT Day # 4 Date:  11/22/18 Red Alert Reason:  Sad/hopeless/anxious/empty? Yes    Outreach attempt: spoke with patient. She states she is doing good since having back surgery.  She is just giving herself time to heal so she can be mobile again.  Discussed red alert with patient. She states she answered no to the machine.  She denies any problems with depression.  Patient voices no further questions or needs at this time.     Plan: RN CM will close case.    Jone Baseman, RN, MSN [facility] Care Management Care Management Coordinator Direct Line [phone] Toll Free: [phone]  Fax: [phone]

## 2018-11-24 DIAGNOSIS — S22080D Wedge compression fracture of T11-T12 vertebra, subsequent encounter for fracture with routine healing: Secondary | ICD-10-CM | POA: Diagnosis not present

## 2018-11-24 DIAGNOSIS — M19041 Primary osteoarthritis, right hand: Secondary | ICD-10-CM | POA: Diagnosis not present

## 2018-11-24 DIAGNOSIS — S32019D Unspecified fracture of first lumbar vertebra, subsequent encounter for fracture with routine healing: Secondary | ICD-10-CM | POA: Diagnosis not present

## 2018-11-24 DIAGNOSIS — I10 Essential (primary) hypertension: Secondary | ICD-10-CM | POA: Diagnosis not present

## 2018-11-24 DIAGNOSIS — M19042 Primary osteoarthritis, left hand: Secondary | ICD-10-CM | POA: Diagnosis not present

## 2018-11-24 DIAGNOSIS — M810 Age-related osteoporosis without current pathological fracture: Secondary | ICD-10-CM | POA: Diagnosis not present

## 2018-11-26 DIAGNOSIS — S32019D Unspecified fracture of first lumbar vertebra, subsequent encounter for fracture with routine healing: Secondary | ICD-10-CM | POA: Diagnosis not present

## 2018-11-26 DIAGNOSIS — M19041 Primary osteoarthritis, right hand: Secondary | ICD-10-CM | POA: Diagnosis not present

## 2018-11-26 DIAGNOSIS — S22080D Wedge compression fracture of T11-T12 vertebra, subsequent encounter for fracture with routine healing: Secondary | ICD-10-CM | POA: Diagnosis not present

## 2018-11-26 DIAGNOSIS — I10 Essential (primary) hypertension: Secondary | ICD-10-CM | POA: Diagnosis not present

## 2018-11-26 DIAGNOSIS — M810 Age-related osteoporosis without current pathological fracture: Secondary | ICD-10-CM | POA: Diagnosis not present

## 2018-11-26 DIAGNOSIS — M19042 Primary osteoarthritis, left hand: Secondary | ICD-10-CM | POA: Diagnosis not present

## 2018-11-27 DIAGNOSIS — M19042 Primary osteoarthritis, left hand: Secondary | ICD-10-CM | POA: Diagnosis not present

## 2018-11-27 DIAGNOSIS — I10 Essential (primary) hypertension: Secondary | ICD-10-CM | POA: Diagnosis not present

## 2018-11-27 DIAGNOSIS — M810 Age-related osteoporosis without current pathological fracture: Secondary | ICD-10-CM | POA: Diagnosis not present

## 2018-11-27 DIAGNOSIS — M19041 Primary osteoarthritis, right hand: Secondary | ICD-10-CM | POA: Diagnosis not present

## 2018-11-27 DIAGNOSIS — S32019D Unspecified fracture of first lumbar vertebra, subsequent encounter for fracture with routine healing: Secondary | ICD-10-CM | POA: Diagnosis not present

## 2018-11-27 DIAGNOSIS — S22080D Wedge compression fracture of T11-T12 vertebra, subsequent encounter for fracture with routine healing: Secondary | ICD-10-CM | POA: Diagnosis not present

## 2018-11-30 DIAGNOSIS — S32019D Unspecified fracture of first lumbar vertebra, subsequent encounter for fracture with routine healing: Secondary | ICD-10-CM | POA: Diagnosis not present

## 2018-11-30 DIAGNOSIS — M19042 Primary osteoarthritis, left hand: Secondary | ICD-10-CM | POA: Diagnosis not present

## 2018-11-30 DIAGNOSIS — M19041 Primary osteoarthritis, right hand: Secondary | ICD-10-CM | POA: Diagnosis not present

## 2018-11-30 DIAGNOSIS — M5430 Sciatica, unspecified side: Secondary | ICD-10-CM | POA: Diagnosis not present

## 2018-11-30 DIAGNOSIS — M8088XA Other osteoporosis with current pathological fracture, vertebra(e), initial encounter for fracture: Secondary | ICD-10-CM | POA: Diagnosis not present

## 2018-11-30 DIAGNOSIS — S22080D Wedge compression fracture of T11-T12 vertebra, subsequent encounter for fracture with routine healing: Secondary | ICD-10-CM | POA: Diagnosis not present

## 2018-11-30 DIAGNOSIS — E871 Hypo-osmolality and hyponatremia: Secondary | ICD-10-CM | POA: Diagnosis not present

## 2018-11-30 DIAGNOSIS — E0500 Thyrotoxicosis with diffuse goiter without thyrotoxic crisis or storm: Secondary | ICD-10-CM | POA: Diagnosis not present

## 2018-11-30 DIAGNOSIS — I10 Essential (primary) hypertension: Secondary | ICD-10-CM | POA: Diagnosis not present

## 2018-11-30 DIAGNOSIS — M810 Age-related osteoporosis without current pathological fracture: Secondary | ICD-10-CM | POA: Diagnosis not present

## 2018-12-01 DIAGNOSIS — M19042 Primary osteoarthritis, left hand: Secondary | ICD-10-CM | POA: Diagnosis not present

## 2018-12-01 DIAGNOSIS — S22080D Wedge compression fracture of T11-T12 vertebra, subsequent encounter for fracture with routine healing: Secondary | ICD-10-CM | POA: Diagnosis not present

## 2018-12-01 DIAGNOSIS — I10 Essential (primary) hypertension: Secondary | ICD-10-CM | POA: Diagnosis not present

## 2018-12-01 DIAGNOSIS — M19041 Primary osteoarthritis, right hand: Secondary | ICD-10-CM | POA: Diagnosis not present

## 2018-12-01 DIAGNOSIS — S32019D Unspecified fracture of first lumbar vertebra, subsequent encounter for fracture with routine healing: Secondary | ICD-10-CM | POA: Diagnosis not present

## 2018-12-01 DIAGNOSIS — M810 Age-related osteoporosis without current pathological fracture: Secondary | ICD-10-CM | POA: Diagnosis not present

## 2018-12-03 DIAGNOSIS — M19042 Primary osteoarthritis, left hand: Secondary | ICD-10-CM | POA: Diagnosis not present

## 2018-12-03 DIAGNOSIS — S32019D Unspecified fracture of first lumbar vertebra, subsequent encounter for fracture with routine healing: Secondary | ICD-10-CM | POA: Diagnosis not present

## 2018-12-03 DIAGNOSIS — S22080D Wedge compression fracture of T11-T12 vertebra, subsequent encounter for fracture with routine healing: Secondary | ICD-10-CM | POA: Diagnosis not present

## 2018-12-03 DIAGNOSIS — M810 Age-related osteoporosis without current pathological fracture: Secondary | ICD-10-CM | POA: Diagnosis not present

## 2018-12-03 DIAGNOSIS — I10 Essential (primary) hypertension: Secondary | ICD-10-CM | POA: Diagnosis not present

## 2018-12-03 DIAGNOSIS — M19041 Primary osteoarthritis, right hand: Secondary | ICD-10-CM | POA: Diagnosis not present

## 2018-12-04 DIAGNOSIS — S22080D Wedge compression fracture of T11-T12 vertebra, subsequent encounter for fracture with routine healing: Secondary | ICD-10-CM | POA: Diagnosis not present

## 2018-12-04 DIAGNOSIS — M19042 Primary osteoarthritis, left hand: Secondary | ICD-10-CM | POA: Diagnosis not present

## 2018-12-04 DIAGNOSIS — M19041 Primary osteoarthritis, right hand: Secondary | ICD-10-CM | POA: Diagnosis not present

## 2018-12-04 DIAGNOSIS — S32019D Unspecified fracture of first lumbar vertebra, subsequent encounter for fracture with routine healing: Secondary | ICD-10-CM | POA: Diagnosis not present

## 2018-12-04 DIAGNOSIS — I10 Essential (primary) hypertension: Secondary | ICD-10-CM | POA: Diagnosis not present

## 2018-12-04 DIAGNOSIS — M810 Age-related osteoporosis without current pathological fracture: Secondary | ICD-10-CM | POA: Diagnosis not present

## 2018-12-09 DIAGNOSIS — M19041 Primary osteoarthritis, right hand: Secondary | ICD-10-CM | POA: Diagnosis not present

## 2018-12-09 DIAGNOSIS — I10 Essential (primary) hypertension: Secondary | ICD-10-CM | POA: Diagnosis not present

## 2018-12-09 DIAGNOSIS — M810 Age-related osteoporosis without current pathological fracture: Secondary | ICD-10-CM | POA: Diagnosis not present

## 2018-12-09 DIAGNOSIS — M19042 Primary osteoarthritis, left hand: Secondary | ICD-10-CM | POA: Diagnosis not present

## 2018-12-09 DIAGNOSIS — S22080D Wedge compression fracture of T11-T12 vertebra, subsequent encounter for fracture with routine healing: Secondary | ICD-10-CM | POA: Diagnosis not present

## 2018-12-09 DIAGNOSIS — S32019D Unspecified fracture of first lumbar vertebra, subsequent encounter for fracture with routine healing: Secondary | ICD-10-CM | POA: Diagnosis not present

## 2018-12-10 DIAGNOSIS — S22081A Stable burst fracture of T11-T12 vertebra, initial encounter for closed fracture: Secondary | ICD-10-CM | POA: Diagnosis not present

## 2018-12-14 DIAGNOSIS — M19042 Primary osteoarthritis, left hand: Secondary | ICD-10-CM | POA: Diagnosis not present

## 2018-12-14 DIAGNOSIS — S32019D Unspecified fracture of first lumbar vertebra, subsequent encounter for fracture with routine healing: Secondary | ICD-10-CM | POA: Diagnosis not present

## 2018-12-14 DIAGNOSIS — S22080D Wedge compression fracture of T11-T12 vertebra, subsequent encounter for fracture with routine healing: Secondary | ICD-10-CM | POA: Diagnosis not present

## 2018-12-14 DIAGNOSIS — M810 Age-related osteoporosis without current pathological fracture: Secondary | ICD-10-CM | POA: Diagnosis not present

## 2018-12-14 DIAGNOSIS — I10 Essential (primary) hypertension: Secondary | ICD-10-CM | POA: Diagnosis not present

## 2018-12-14 DIAGNOSIS — M19041 Primary osteoarthritis, right hand: Secondary | ICD-10-CM | POA: Diagnosis not present

## 2018-12-21 DIAGNOSIS — S22080D Wedge compression fracture of T11-T12 vertebra, subsequent encounter for fracture with routine healing: Secondary | ICD-10-CM | POA: Diagnosis not present

## 2018-12-21 DIAGNOSIS — M19042 Primary osteoarthritis, left hand: Secondary | ICD-10-CM | POA: Diagnosis not present

## 2018-12-21 DIAGNOSIS — M19041 Primary osteoarthritis, right hand: Secondary | ICD-10-CM | POA: Diagnosis not present

## 2018-12-21 DIAGNOSIS — I10 Essential (primary) hypertension: Secondary | ICD-10-CM | POA: Diagnosis not present

## 2018-12-21 DIAGNOSIS — S32019D Unspecified fracture of first lumbar vertebra, subsequent encounter for fracture with routine healing: Secondary | ICD-10-CM | POA: Diagnosis not present

## 2018-12-21 DIAGNOSIS — M810 Age-related osteoporosis without current pathological fracture: Secondary | ICD-10-CM | POA: Diagnosis not present

## 2018-12-22 ENCOUNTER — Other Ambulatory Visit: Payer: Self-pay | Admitting: Neurosurgery

## 2018-12-22 DIAGNOSIS — S22081A Stable burst fracture of T11-T12 vertebra, initial encounter for closed fracture: Secondary | ICD-10-CM

## 2018-12-25 DIAGNOSIS — M19042 Primary osteoarthritis, left hand: Secondary | ICD-10-CM | POA: Diagnosis not present

## 2018-12-25 DIAGNOSIS — S32019D Unspecified fracture of first lumbar vertebra, subsequent encounter for fracture with routine healing: Secondary | ICD-10-CM | POA: Diagnosis not present

## 2018-12-25 DIAGNOSIS — S22080D Wedge compression fracture of T11-T12 vertebra, subsequent encounter for fracture with routine healing: Secondary | ICD-10-CM | POA: Diagnosis not present

## 2018-12-25 DIAGNOSIS — I10 Essential (primary) hypertension: Secondary | ICD-10-CM | POA: Diagnosis not present

## 2018-12-25 DIAGNOSIS — M19041 Primary osteoarthritis, right hand: Secondary | ICD-10-CM | POA: Diagnosis not present

## 2018-12-25 DIAGNOSIS — M810 Age-related osteoporosis without current pathological fracture: Secondary | ICD-10-CM | POA: Diagnosis not present

## 2018-12-28 ENCOUNTER — Ambulatory Visit
Admission: RE | Admit: 2018-12-28 | Discharge: 2018-12-28 | Disposition: A | Discharge status: Home / Self Care | Payer: Medicare Other | Source: Ambulatory Visit | Attending: Neurosurgery | Admitting: Neurosurgery

## 2018-12-28 DIAGNOSIS — M19041 Primary osteoarthritis, right hand: Secondary | ICD-10-CM | POA: Diagnosis not present

## 2018-12-28 DIAGNOSIS — M810 Age-related osteoporosis without current pathological fracture: Secondary | ICD-10-CM | POA: Diagnosis not present

## 2018-12-28 DIAGNOSIS — I10 Essential (primary) hypertension: Secondary | ICD-10-CM | POA: Diagnosis not present

## 2018-12-28 DIAGNOSIS — S32011A Stable burst fracture of first lumbar vertebra, initial encounter for closed fracture: Secondary | ICD-10-CM | POA: Diagnosis not present

## 2018-12-28 DIAGNOSIS — S22080D Wedge compression fracture of T11-T12 vertebra, subsequent encounter for fracture with routine healing: Secondary | ICD-10-CM | POA: Diagnosis not present

## 2018-12-28 DIAGNOSIS — S32019D Unspecified fracture of first lumbar vertebra, subsequent encounter for fracture with routine healing: Secondary | ICD-10-CM | POA: Diagnosis not present

## 2018-12-28 DIAGNOSIS — S22081A Stable burst fracture of T11-T12 vertebra, initial encounter for closed fracture: Secondary | ICD-10-CM | POA: Diagnosis not present

## 2018-12-28 DIAGNOSIS — M19042 Primary osteoarthritis, left hand: Secondary | ICD-10-CM | POA: Diagnosis not present

## 2018-12-31 DIAGNOSIS — M810 Age-related osteoporosis without current pathological fracture: Secondary | ICD-10-CM | POA: Diagnosis not present

## 2018-12-31 DIAGNOSIS — M19041 Primary osteoarthritis, right hand: Secondary | ICD-10-CM | POA: Diagnosis not present

## 2018-12-31 DIAGNOSIS — S32019D Unspecified fracture of first lumbar vertebra, subsequent encounter for fracture with routine healing: Secondary | ICD-10-CM | POA: Diagnosis not present

## 2018-12-31 DIAGNOSIS — I10 Essential (primary) hypertension: Secondary | ICD-10-CM | POA: Diagnosis not present

## 2018-12-31 DIAGNOSIS — S22080D Wedge compression fracture of T11-T12 vertebra, subsequent encounter for fracture with routine healing: Secondary | ICD-10-CM | POA: Diagnosis not present

## 2018-12-31 DIAGNOSIS — M19042 Primary osteoarthritis, left hand: Secondary | ICD-10-CM | POA: Diagnosis not present

## 2018-12-31 DIAGNOSIS — S22081A Stable burst fracture of T11-T12 vertebra, initial encounter for closed fracture: Secondary | ICD-10-CM | POA: Diagnosis not present

## 2019-01-04 DIAGNOSIS — M810 Age-related osteoporosis without current pathological fracture: Secondary | ICD-10-CM | POA: Diagnosis not present

## 2019-01-04 DIAGNOSIS — S32019D Unspecified fracture of first lumbar vertebra, subsequent encounter for fracture with routine healing: Secondary | ICD-10-CM | POA: Diagnosis not present

## 2019-01-04 DIAGNOSIS — M19042 Primary osteoarthritis, left hand: Secondary | ICD-10-CM | POA: Diagnosis not present

## 2019-01-04 DIAGNOSIS — S22080D Wedge compression fracture of T11-T12 vertebra, subsequent encounter for fracture with routine healing: Secondary | ICD-10-CM | POA: Diagnosis not present

## 2019-01-04 DIAGNOSIS — I10 Essential (primary) hypertension: Secondary | ICD-10-CM | POA: Diagnosis not present

## 2019-01-04 DIAGNOSIS — M19041 Primary osteoarthritis, right hand: Secondary | ICD-10-CM | POA: Diagnosis not present

## 2019-01-21 DIAGNOSIS — M810 Age-related osteoporosis without current pathological fracture: Secondary | ICD-10-CM | POA: Diagnosis not present

## 2019-01-21 DIAGNOSIS — Z0000 Encounter for general adult medical examination without abnormal findings: Secondary | ICD-10-CM | POA: Diagnosis not present

## 2019-01-21 DIAGNOSIS — M150 Primary generalized (osteo)arthritis: Secondary | ICD-10-CM | POA: Diagnosis not present

## 2019-01-21 DIAGNOSIS — I10 Essential (primary) hypertension: Secondary | ICD-10-CM | POA: Diagnosis not present

## 2019-01-21 DIAGNOSIS — E782 Mixed hyperlipidemia: Secondary | ICD-10-CM | POA: Diagnosis not present

## 2019-01-26 DIAGNOSIS — S22081A Stable burst fracture of T11-T12 vertebra, initial encounter for closed fracture: Secondary | ICD-10-CM | POA: Diagnosis not present

## 2019-01-30 DIAGNOSIS — M8008XD Age-related osteoporosis with current pathological fracture, vertebra(e), subsequent encounter for fracture with routine healing: Secondary | ICD-10-CM | POA: Diagnosis not present

## 2019-01-30 DIAGNOSIS — Z79891 Long term (current) use of opiate analgesic: Secondary | ICD-10-CM | POA: Diagnosis not present

## 2019-02-05 DIAGNOSIS — H353223 Exudative age-related macular degeneration, left eye, with inactive scar: Secondary | ICD-10-CM | POA: Diagnosis not present

## 2019-02-05 DIAGNOSIS — H353211 Exudative age-related macular degeneration, right eye, with active choroidal neovascularization: Secondary | ICD-10-CM | POA: Diagnosis not present

## 2019-02-05 DIAGNOSIS — H43391 Other vitreous opacities, right eye: Secondary | ICD-10-CM | POA: Diagnosis not present

## 2019-02-05 DIAGNOSIS — H43813 Vitreous degeneration, bilateral: Secondary | ICD-10-CM | POA: Diagnosis not present

## 2019-03-01 DIAGNOSIS — M8008XD Age-related osteoporosis with current pathological fracture, vertebra(e), subsequent encounter for fracture with routine healing: Secondary | ICD-10-CM | POA: Diagnosis not present

## 2019-03-01 DIAGNOSIS — Z79891 Long term (current) use of opiate analgesic: Secondary | ICD-10-CM | POA: Diagnosis not present

## 2019-03-09 DIAGNOSIS — S22081A Stable burst fracture of T11-T12 vertebra, initial encounter for closed fracture: Secondary | ICD-10-CM | POA: Diagnosis not present

## 2019-04-22 DIAGNOSIS — Z981 Arthrodesis status: Secondary | ICD-10-CM | POA: Diagnosis not present

## 2019-04-22 DIAGNOSIS — S22081A Stable burst fracture of T11-T12 vertebra, initial encounter for closed fracture: Secondary | ICD-10-CM | POA: Diagnosis not present

## 2019-04-22 DIAGNOSIS — I10 Essential (primary) hypertension: Secondary | ICD-10-CM | POA: Diagnosis not present

## 2019-05-13 ENCOUNTER — Other Ambulatory Visit: Payer: Self-pay | Admitting: Family Medicine

## 2019-05-13 DIAGNOSIS — Z1231 Encounter for screening mammogram for malignant neoplasm of breast: Secondary | ICD-10-CM

## 2019-05-19 DIAGNOSIS — H353223 Exudative age-related macular degeneration, left eye, with inactive scar: Secondary | ICD-10-CM | POA: Diagnosis not present

## 2019-05-19 DIAGNOSIS — H353211 Exudative age-related macular degeneration, right eye, with active choroidal neovascularization: Secondary | ICD-10-CM | POA: Diagnosis not present

## 2019-05-27 DIAGNOSIS — Z981 Arthrodesis status: Secondary | ICD-10-CM | POA: Diagnosis not present

## 2019-05-31 IMAGING — CT CT T SPINE W/O CM
1 series · 12 of 14 positions shown, 15 images · non-contrast
Comparison: 10/13/2018

CLINICAL DATA: Follow-up thoracic burst fracture. Pain in the
thighs. Buttock numbness. Prior fusion.

EXAM:
CT THORACIC SPINE WITHOUT CONTRAST
TECHNIQUE: Multidetector CT images of the thoracic were obtained using the
standard protocol without intravenous contrast.

[Series 3: t spine soft · axial · 0.35mm/px · z∈[-365,-92]mm · 12 of 109 slices shown, 15 images]
[im 9/109  soft-tissue]
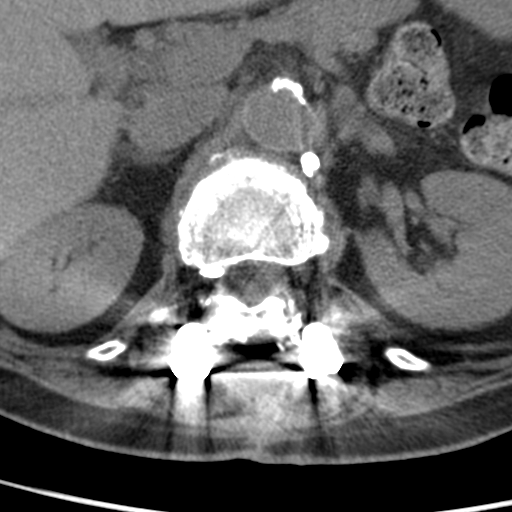
[im 9/109  bone]
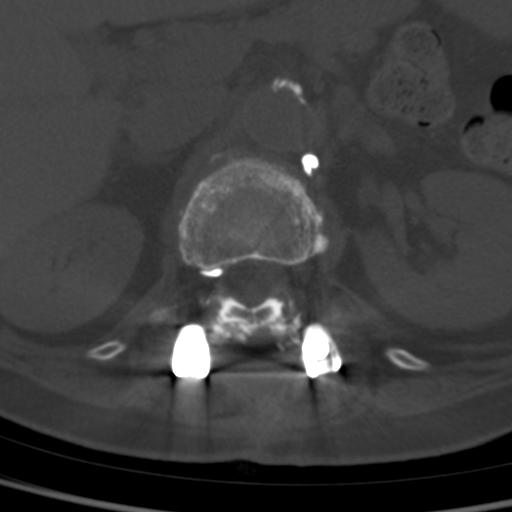
[im 17/109  bone]
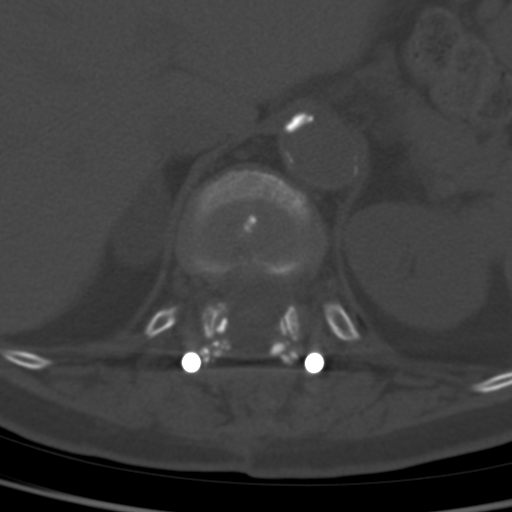
[im 25/109  bone]
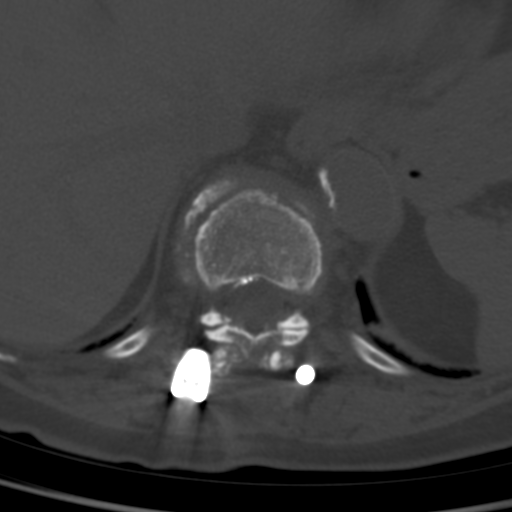
[im 34/109  bone]
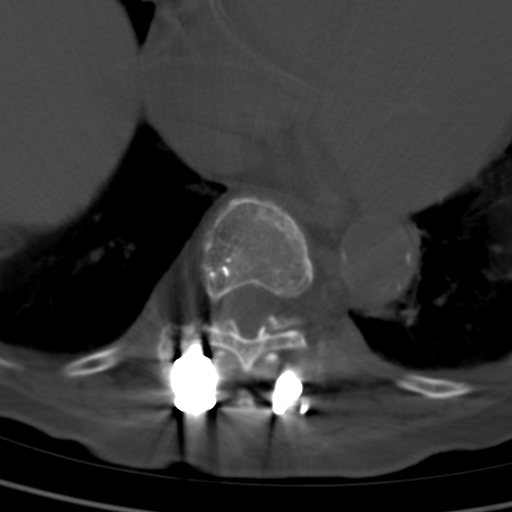
[im 42/109  soft-tissue]
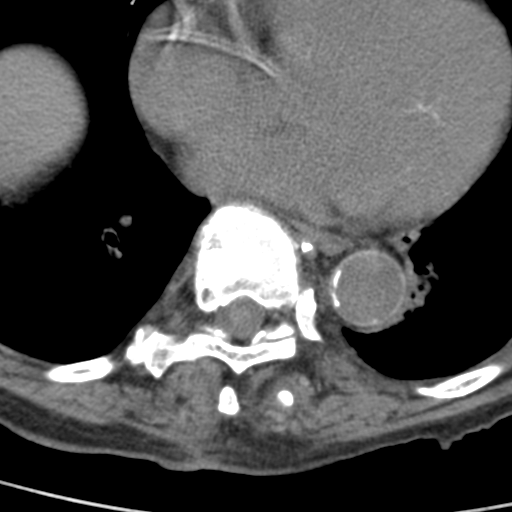
[im 42/109  bone]
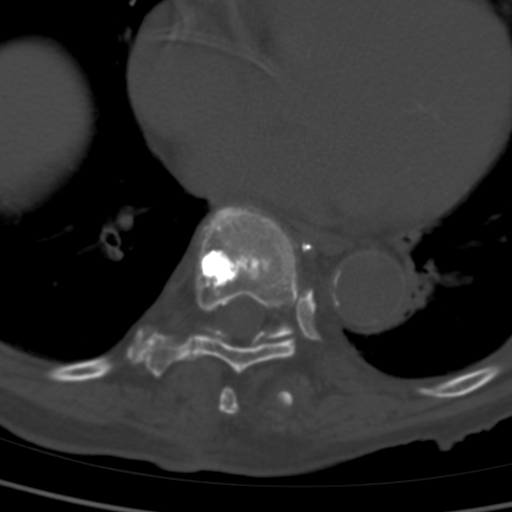
[im 50/109  bone]
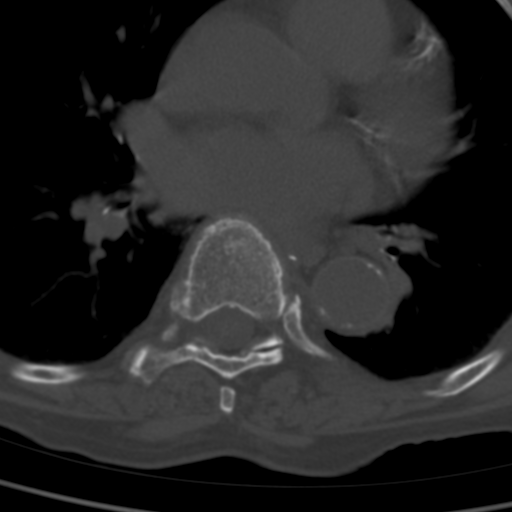
[im 59/109  bone]
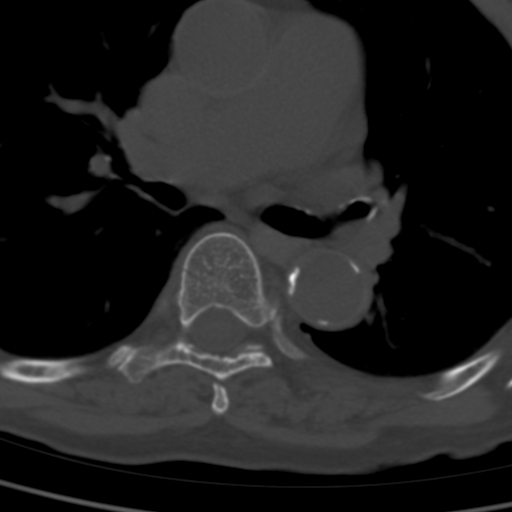
[im 67/109  bone]
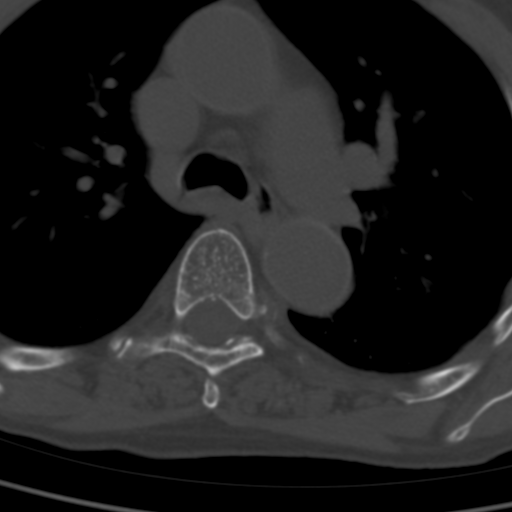
[im 75/109  soft-tissue]
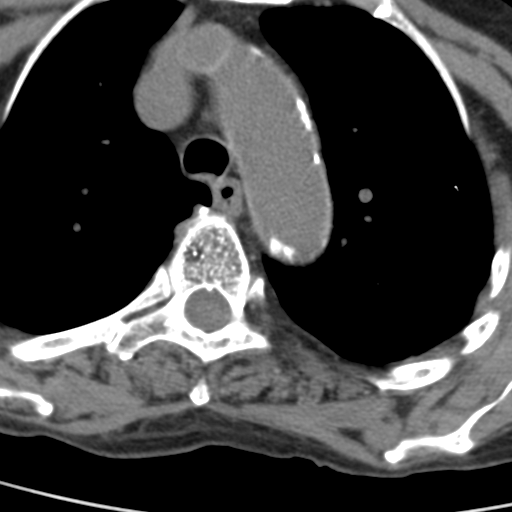
[im 75/109  bone]
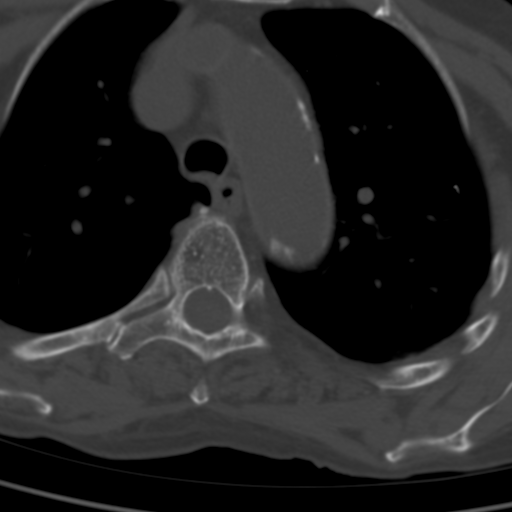
[im 84/109  bone]
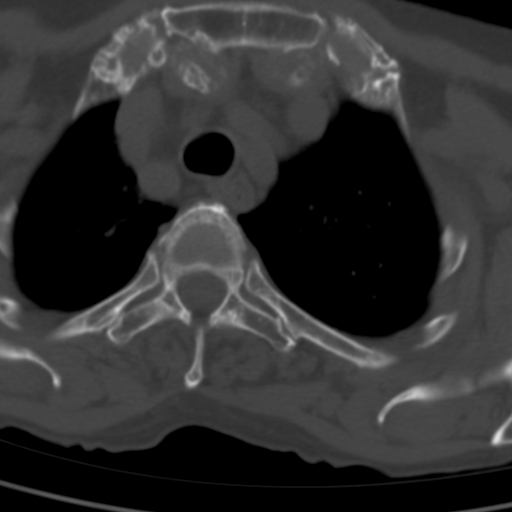
[im 92/109  bone]
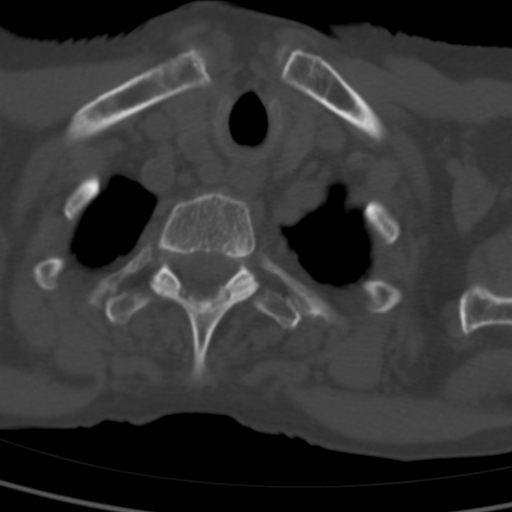
[im 100/109  bone]
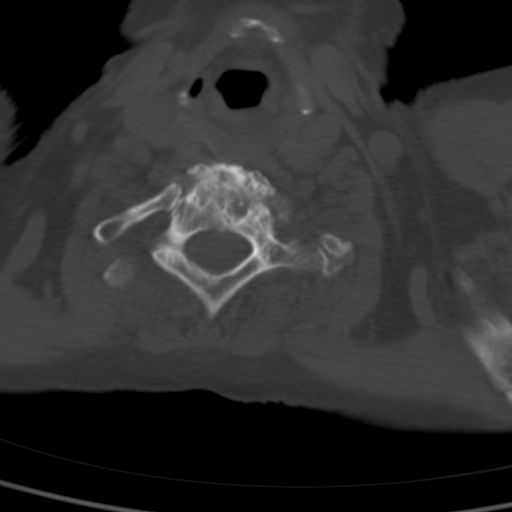

[12 of 14 positions shown; findings below may reference images not displayed]

FINDINGS: Alignment: Mild right convex cervicothoracic curvature. No
listhesis.

Vertebrae: A chronic T12 burst fracture demonstrates mild
progression of severe vertebral body height loss. Fracture lines
remain visible in the vertebral body and both pedicles with
surrounding sclerosis. Retropulsion of the posterior T12 vertebral
body measures 12 mm, slightly increased. Posterior thoracolumbar
fusion has been performed extending from T10 into the lumbar spine
with the lumbar portion reported separately. The right T10 screw
courses lateral to the pedicle, and there is an adjacent
nondisplaced vertically oriented fracture involving the right T10
pedicle and extending through the base of the right transverse
process and lateral aspects of the facets.

There are also new nondisplaced fractures through the bases of the
T9 transverse processes bilaterally with extension into the right
inferior T9 facet laterally. Mild sclerosis and callus formation are
noted along the right T9 fracture. There has been interval vertebral
augmentation at T9, T10, and T11 with methylmethacrylate in the
ventral epidural space at each level. The bones appear diffusely
osteopenic without a suspicious focal osseous lesion identified. A
remote medial right eleventh rib fracture is again noted.

Paraspinal and other soft tissues: Unchanged 3 cm right adrenal
adenoma. Aortic and three-vessel coronary artery atherosclerosis.
Heterogeneous thyroid including a discrete 1 cm hypoattenuating
nodule on the right. Mild atelectasis in the lung bases.

Disc levels: Interval posterior decompression at T11-12 and T12-L1.
Lower thoracic spinal canal partially obscured by metallic streak
artifact. Partially visualized lower cervical disc and facet
degeneration.
IMPRESSION: 1. Chronic T12 burst fracture with mild progression of severe
vertebral body height loss and slightly increased retropulsion.
2. Interval posterior thoracolumbar fusion with posterior
decompression of the spinal canal at T12.
3. Right T10 screw coursing lateral to the pedicle with associated
nondisplaced fracture of the pedicle and base of the transverse
process.
4. New nondisplaced fractures through the base of the T9 transverse
processes bilaterally.

## 2019-06-03 ENCOUNTER — Ambulatory Visit: Payer: Medicare Other

## 2019-07-01 ENCOUNTER — Ambulatory Visit: Payer: Medicare Other

## 2019-07-16 DIAGNOSIS — R350 Frequency of micturition: Secondary | ICD-10-CM | POA: Diagnosis not present

## 2019-08-03 DIAGNOSIS — G3184 Mild cognitive impairment, so stated: Secondary | ICD-10-CM | POA: Diagnosis not present

## 2019-08-03 DIAGNOSIS — I10 Essential (primary) hypertension: Secondary | ICD-10-CM | POA: Diagnosis not present

## 2019-08-03 DIAGNOSIS — M5430 Sciatica, unspecified side: Secondary | ICD-10-CM | POA: Diagnosis not present

## 2019-08-03 DIAGNOSIS — E782 Mixed hyperlipidemia: Secondary | ICD-10-CM | POA: Diagnosis not present

## 2019-09-07 DIAGNOSIS — H43813 Vitreous degeneration, bilateral: Secondary | ICD-10-CM | POA: Diagnosis not present

## 2019-09-07 DIAGNOSIS — H353211 Exudative age-related macular degeneration, right eye, with active choroidal neovascularization: Secondary | ICD-10-CM | POA: Diagnosis not present

## 2019-09-07 DIAGNOSIS — H353223 Exudative age-related macular degeneration, left eye, with inactive scar: Secondary | ICD-10-CM | POA: Diagnosis not present

## 2019-09-07 DIAGNOSIS — H43391 Other vitreous opacities, right eye: Secondary | ICD-10-CM | POA: Diagnosis not present

## 2019-12-28 DIAGNOSIS — H43813 Vitreous degeneration, bilateral: Secondary | ICD-10-CM | POA: Diagnosis not present

## 2019-12-28 DIAGNOSIS — H353211 Exudative age-related macular degeneration, right eye, with active choroidal neovascularization: Secondary | ICD-10-CM | POA: Diagnosis not present

## 2019-12-28 DIAGNOSIS — H353223 Exudative age-related macular degeneration, left eye, with inactive scar: Secondary | ICD-10-CM | POA: Diagnosis not present

## 2019-12-28 DIAGNOSIS — H35372 Puckering of macula, left eye: Secondary | ICD-10-CM | POA: Diagnosis not present

## 2020-01-31 DIAGNOSIS — M150 Primary generalized (osteo)arthritis: Secondary | ICD-10-CM | POA: Diagnosis not present

## 2020-01-31 DIAGNOSIS — I10 Essential (primary) hypertension: Secondary | ICD-10-CM | POA: Diagnosis not present

## 2020-01-31 DIAGNOSIS — M810 Age-related osteoporosis without current pathological fracture: Secondary | ICD-10-CM | POA: Diagnosis not present

## 2020-01-31 DIAGNOSIS — Z0000 Encounter for general adult medical examination without abnormal findings: Secondary | ICD-10-CM | POA: Diagnosis not present

## 2020-02-03 ENCOUNTER — Other Ambulatory Visit: Payer: Self-pay | Admitting: Family Medicine

## 2020-02-03 DIAGNOSIS — M810 Age-related osteoporosis without current pathological fracture: Secondary | ICD-10-CM

## 2020-03-09 ENCOUNTER — Other Ambulatory Visit: Payer: Self-pay | Admitting: Student

## 2020-03-09 DIAGNOSIS — R030 Elevated blood-pressure reading, without diagnosis of hypertension: Secondary | ICD-10-CM | POA: Diagnosis not present

## 2020-03-09 DIAGNOSIS — M5416 Radiculopathy, lumbar region: Secondary | ICD-10-CM | POA: Diagnosis not present

## 2020-03-16 ENCOUNTER — Other Ambulatory Visit: Payer: Self-pay | Admitting: Student

## 2020-03-16 DIAGNOSIS — M5416 Radiculopathy, lumbar region: Secondary | ICD-10-CM

## 2020-03-29 ENCOUNTER — Other Ambulatory Visit: Payer: Self-pay

## 2020-03-29 ENCOUNTER — Ambulatory Visit
Admission: RE | Admit: 2020-03-29 | Discharge: 2020-03-29 | Disposition: A | Discharge status: Home / Self Care | Payer: Medicare Other | Source: Ambulatory Visit | Attending: Student | Admitting: Student

## 2020-03-29 DIAGNOSIS — M5416 Radiculopathy, lumbar region: Secondary | ICD-10-CM

## 2020-03-29 DIAGNOSIS — M545 Low back pain: Secondary | ICD-10-CM | POA: Diagnosis not present

## 2020-04-06 DIAGNOSIS — M5416 Radiculopathy, lumbar region: Secondary | ICD-10-CM | POA: Diagnosis not present

## 2020-04-18 DIAGNOSIS — H353223 Exudative age-related macular degeneration, left eye, with inactive scar: Secondary | ICD-10-CM | POA: Diagnosis not present

## 2020-04-18 DIAGNOSIS — H35372 Puckering of macula, left eye: Secondary | ICD-10-CM | POA: Diagnosis not present

## 2020-04-18 DIAGNOSIS — H353211 Exudative age-related macular degeneration, right eye, with active choroidal neovascularization: Secondary | ICD-10-CM | POA: Diagnosis not present

## 2020-04-18 DIAGNOSIS — H43391 Other vitreous opacities, right eye: Secondary | ICD-10-CM | POA: Diagnosis not present

## 2020-04-26 ENCOUNTER — Encounter: Payer: Self-pay | Admitting: Psychology

## 2020-06-08 ENCOUNTER — Ambulatory Visit (INDEPENDENT_AMBULATORY_CARE_PROVIDER_SITE_OTHER): Payer: Medicare Other | Admitting: Psychology

## 2020-06-08 ENCOUNTER — Other Ambulatory Visit: Payer: Self-pay

## 2020-06-08 ENCOUNTER — Ambulatory Visit: Payer: Medicare Other | Admitting: Psychology

## 2020-06-08 ENCOUNTER — Encounter: Payer: Self-pay | Admitting: Psychology

## 2020-06-08 DIAGNOSIS — R4189 Other symptoms and signs involving cognitive functions and awareness: Secondary | ICD-10-CM

## 2020-06-08 DIAGNOSIS — F0390 Unspecified dementia without behavioral disturbance: Secondary | ICD-10-CM

## 2020-06-08 DIAGNOSIS — F0150 Vascular dementia without behavioral disturbance: Secondary | ICD-10-CM | POA: Diagnosis not present

## 2020-06-08 HISTORY — DX: Unspecified dementia, unspecified severity, without behavioral disturbance, psychotic disturbance, mood disturbance, and anxiety: F03.90

## 2020-06-08 NOTE — Progress Notes (Signed)
   Psychometrician Note   Cognitive testing was administered to Laurie Galloway by Milana Kidney, B.S. (psychometrist) under the supervision of Dr. Christia Reading, Ph.D., licensed psychologist on 06/08/20. Laurie Galloway did not appear overtly distressed by the testing session per behavioral observation or responses across self-report questionnaires. Dr. Christia Reading, Ph.D. checked in with Laurie Galloway as needed to manage any distress related to testing procedures (if applicable). Rest breaks were offered.    The battery of tests administered was selected by Dr. Christia Reading, Ph.D. with consideration to Laurie Galloway's current level of functioning, the nature of her symptoms, emotional and behavioral responses during interview, level of literacy, observed level of motivation/effort, and the nature of the referral question. This battery was communicated to the psychometrist. Communication between Dr. Christia Reading, Ph.D. and the psychometrist was ongoing throughout the evaluation and Dr. Christia Reading, Ph.D. was immediately accessible at all times. Dr. Christia Reading, Ph.D. provided supervision to the psychometrist on the date of this service to the extent necessary to assure the quality of all services provided.    Laurie Galloway will return within approximately 1-2 weeks for an interactive feedback session with Dr. Melvyn Novas at which time her test performances, clinical impressions, and treatment recommendations will be reviewed in detail. Laurie Galloway understands she can contact our office should she require our assistance before this time.  A total of 130 minutes of billable time were spent face-to-face with Laurie Galloway by the psychometrist. This includes both test administration and scoring time. Billing for these services is reflected in the clinical report generated by Dr. Christia Reading, Ph.D..  This note reflects time spent with the psychometrician and does not include test scores or any clinical  interpretations made by Dr. Melvyn Novas. The full report will follow in a separate note.

## 2020-06-08 NOTE — Progress Notes (Signed)
NEUROPSYCHOLOGICAL EVALUATION [place] [place] [facility] Neurology  Date of Evaluation: June 08, 2020  Reason for Referral:   Laurie Galloway is a 86 y.o. right-handed Caucasian female referred by Mayra Neer, M.D., to characterize her current cognitive functioning and assist with diagnostic clarity and treatment planning in the context of subjective cognitive decline and concerns surrounding a neurodegenerative illness.   Assessment and Plan:   Clinical Impression(s): Laurie Galloway pattern of performance is suggestive of prominent deficits with cognitive flexibility, response inhibition, and learning and memory. Performance variability was also exhibited across processing speed. Performance across domains of attention/concentration, abstract reasoning, receptive and expressive language, and visuospatial abilities were within expected normative ranges. She also performed well on a task assessing safety and judgment. Laurie Galloway and her daughter reported difficulties completing instrumental activities of daily living (ADLs), especially surrounding medication and financial management. This, coupled with evidence for cognitive dysfunction described above, suggests that she meets criteria for a Major Neurocognitive Disorder (formerly "dementia") at the present time. However, she would fall towards the mild end of this spectrum currently.   The etiology of cognitive weaknesses is unclear. Across memory measures, she demonstrated variable but generally low scores across delayed recall trials. She also performed very poorly across verbal recognition trials but strongly across a visual recognition task. While this certainly suggests a weaknesses with information storage, it does not suggest a consistent impairment. I cannot rule out the early beginnings of a neurodegenerative condition, namely Alzheimer's disease, based on the current scores. However, outside of executive  dysfunction, performances across other non-memory domains commonly impacted earlier in Alzheimer's disease (e.g., semantic fluency, confrontation naming, and visuospatial abilities) were strong. There is the potential for a vascular contribution to cognitive deficits given the presence of several cardiovascular medical conditions in her history. However, neuroimaging was not available to assess the presence of small vessel ischemic changes or the presence of any remote lacunar infarcts. Intact visuospatial abilities, coupled with no report of behavioral disturbances (e.g., fluctuations in alertness, REM sleep disorder, visual hallucinations, or parkinsonisms) makes a Lewy body dementia presentation less likely. Current testing and presentation does not suggest frontotemporal dementia. Continued medical monitoring will be important moving forward.  Recommendations: A repeat neuropsychological evaluation in 12-18 months (or sooner if functional decline is noted) is recommended to assess the trajectory of future cognitive decline should it occur. This will also aid in future efforts towards improved diagnostic clarity.  Per available records, I cannot confirm that Laurie Galloway is being followed by a neurologist. Given objective evidence suggesting cognitive dysfunction and the chance that symptoms could represent very early stages of a neurodegenerative condition, I will refer her to a neurologist for ongoing treatment.   Neuroimaging in the form of a brain MRI would be beneficial to assess the potential for vascular contributions to her current presentation, as well as to assess for any other potential anatomical correlates for ongoing cognitive dysfunction.   Performance across neurocognitive testing is not a strong predictor of an individual's safety operating a motor vehicle. However, she did demonstrate significant deficits across cognitive flexibility and variability across processing speed. I would  recommend that she continue to abstain from driving pending the completion of a formal driving evaluation. Should her family wish to pursue a formalized driving evaluation, they would be encouraged to contact [employer] in [place], [place] at [phone]. Another option would be through [facility]; however, the latter would likely require a referral  from a medical doctor. [facility] can be reached directly at [phone].  Should there be a progression of her current deficits over time, Laurie Galloway is unlikely to regain any independent living skills lost. Therefore, it is recommended that she remain as involved as possible in all aspects of household chores, finances, and medication management, with supervision to ensure adequate performance. She will likely benefit from the establishment and maintenance of a routine in order to maximize her functional abilities over time.  It will be important for Laurie Galloway to have another person with her when in situations where she may need to process information, weigh the pros and cons of different options, and make decisions, in order to ensure that she fully understands and recalls all information to be considered.  If not already done, Laurie Galloway and her family may wish to discuss future plans for caretaking and seek out community options for in home/residential care should they become necessary.  Laurie Galloway is encouraged to attend to lifestyle factors for brain health (e.g., regular physical exercise, good nutrition habits, regular participation in cognitively-stimulating activities, and general stress management techniques), which are likely to have benefits for both emotional adjustment and cognition. Optimal control of vascular risk factors (including safe cardiovascular exercise and adherence to dietary recommendations) is encouraged.  When learning new information, she would benefit from information being broken up into small,  manageable pieces. She may also find it helpful to articulate the material in her own words and in a context to promote encoding at the onset of a new task. This material may need to be repeated multiple times to promote encoding.  To address problems with processing speed, she may wish to consider:   -Ensuring that she is alerted when essential material or instructions are being presented   -Adjusting the speed at which new information is presented   -Allowing for more time in comprehending, processing, and responding in conversation  To address problems with executive dysfunction, she may wish to consider:   -Avoiding external distractions when needing to concentrate   -Limiting exposure to fast paced environments with multiple sensory demands   -Writing down complicated information and using checklists   -Attempting and completing one task at a time (i.e., no multi-tasking)   -Verbalizing aloud each step of a task to maintain focus   -Reducing the amount of information considered at one time  Review of Records:   Ms. Truszkowski was seen by [facility] Mayra Neer, M.D) on 01/31/2020 for follow-up. Dr. Brigitte Pulse noted that Ms. Ohagan's daughter passed away since the last time she was seen. Ms. Lorensen was said to continue to struggle with leg weakness since her T12 burst fracture and subsequent repair. Her right leg remains weak and she generally ambulates with the assistance of a wheelchair. Her daughter was present and reported ongoing memory difficulties. She provided an example that Ms. Weisel appears to forget that her daughter has passed away. She is currently prescribed memantine 10mg . Ms. Radilla currently lives alone but has daytime help (e.g., a helper who comes every day except Thursdays for a few hours each morning). Her daughter also checks on her regularly. Ultimately, Ms. Quirarte was referred for a comprehensive neuropsychological evaluation to characterize her cognitive abilities and to  assist with diagnostic clarity and treatment planning.   No neuroimaging was available for my review at the time of the current evaluation.   Past Medical History:  Diagnosis Date  . Aortic atherosclerosis 10/17/2018  . Arthritis  hands  . Carpal tunnel syndrome of left wrist 08/2016  . Chronic back pain 01/07/2012  . Closed left hip fracture 10/13/2017  . Cochlear implant in place 10/13/2017  . Degeneration of lumbar intervertebral disc 10/06/2018  . Dental crowns present   . Epigastric pain 01/07/2012  . Gastrointestinal bleeding 01/07/2012  . HTN (hypertension) 01/07/2012   states under control with meds., has been on med. x "long time"  . Hyponatremia 10/17/2018  . Lumbar post-laminectomy syndrome 10/06/2018  . Pain in left knee 01/09/2018  . PONV (postoperative nausea and vomiting)   . Small bowel obstruction 01/07/2012  . Subclinical hyperthyroidism 06/18/2013  . Tachycardia 01/07/2012    Past Surgical History:  Procedure Laterality Date  . ANTERIOR AND POSTERIOR VAGINAL REPAIR  11/08/2002  . APPENDECTOMY    . BOWEL RESECTION  01/08/2012   Procedure: SMALL BOWEL RESECTION;  Surgeon: Imogene Burn. Georgette Dover, MD;  Location: [facility];  Service: General;  Laterality: N/A;  . CARPAL TUNNEL RELEASE Left 08/29/2016   Procedure: CARPAL TUNNEL RELEASE, left;  Surgeon: Leanora Cover, MD;  Location: [facility];  Service: Orthopedics;  Laterality: Left;  . CATARACT EXTRACTION W/ INTRAOCULAR LENS IMPLANT Left 01/09/2011  . CATARACT EXTRACTION W/ INTRAOCULAR LENS IMPLANT Right 03/06/2011  . COLONOSCOPY WITH PROPOFOL N/A 08/24/2013   Procedure: COLONOSCOPY WITH PROPOFOL;  Surgeon: Garlan Fair, MD;  Location: [facility];  Service: Endoscopy;  Laterality: N/A;  . ESOPHAGOGASTRODUODENOSCOPY (EGD) WITH PROPOFOL N/A 08/24/2013   Procedure: ESOPHAGOGASTRODUODENOSCOPY (EGD) WITH PROPOFOL;  Surgeon: Garlan Fair, MD;  Location: [facility];  Service: Endoscopy;  Laterality: N/A;  . EXTERNAL EAR  SURGERY Bilateral    stapedectomy   . INTRAMEDULLARY (IM) NAIL INTERTROCHANTERIC Left 10/14/2017   Procedure: INTRAMEDULLARY (IM) NAIL INTERTROCHANTRIC;  Surgeon: Paralee Cancel, MD;  Location: [facility];  Service: Orthopedics;  Laterality: Left;  . LAMINECTOMY WITH POSTERIOR LATERAL ARTHRODESIS LEVEL 4 N/A 10/19/2018   Procedure: Thoracic ten-Lumbar five posterior lateral fusion with pedicle screws, Local autograft and allograft, Thoracic eleven and Thoracic twelve laminectomy with transpedicular fracture reduction;  Surgeon: Earnie Larsson, MD;  Location: [facility];  Service: Neurosurgery;  Laterality: N/A;  . LAPAROSCOPIC ASSISTED VAGINAL HYSTERECTOMY  11/08/2002  . LAPAROSCOPIC BILATERAL SALPINGO OOPHERECTOMY Bilateral 11/08/2002  . LAPAROSCOPIC LYSIS OF ADHESIONS  11/08/2002  . LAPAROTOMY  01/08/2012   Procedure: EXPLORATORY LAPAROTOMY;  Surgeon: Imogene Burn. Georgette Dover, MD;  Location: [facility];  Service: General;  Laterality: N/A;  . LUMBAR FUSION  03/19/2010   L3-4, L4-5  . LUMBAR LAMINECTOMY/DECOMPRESSION MICRODISCECTOMY  03/19/2010   L3-4, L4-5  . SHOULDER ARTHROSCOPY W/ ROTATOR CUFF REPAIR    . TONSILLECTOMY     age 48  . VERTEBROPLASTY N/A 10/19/2018   Procedure: Thoracic nine prophylactic vertebroplasty;  Surgeon: Earnie Larsson, MD;  Location: [facility];  Service: Neurosurgery;  Laterality: N/A;    Clinical Interview:   Cognitive Symptoms: Decreased short-term memory: Denied. Ms. Guilford acknowledged that she is "not good with old dates," but largely denied any ongoing memory concerns. However, her daughter Anne Ng expressed noted concerns surrounding short-term memory. Provided examples included trouble remembering the details of prior conversations, forgetting what she ate for dinner the night before, trouble remembering where her grandsons live, and seemingly forgetting that her daughter recently passed away. Deficits were said to first become noticeable in January 2020 and have exhibited a gradual decline  since that time.  Decreased long-term memory: Denied. Decreased attention/concentration: Denied. Reduced processing speed: Denied. Difficulties with executive functions: Denied. When  asked about indecisiveness, she stated that she doesn't have many decision to make these days as many things are managed for her by her daughter. Personality changes or trouble with impulsivity were denied.  Difficulties with emotion regulation: Denied. Difficulties with receptive language: Denied. Difficulties with word finding: Denied. Decreased visuoperceptual ability: Denied.  Difficulties completing ADLs: Endorsed. Her daughter Anne Ng manages her medications and fixes her pillbox. Ms. Lourenco will take these medications but occasionally forgets to take her evening doses. Anne Ng also manages her mother's personal finances and bill paying efforts. Ms. Sek does not currently drive, at least partially due to ongoing weakness in her lower extremities.   Additional Medical History: History of traumatic brain injury/concussion: Denied. History of stroke: Denied. History of seizure activity: Denied. History of known exposure to toxins: Denied. Symptoms of chronic pain: Endorsed. She reported ongoing pain symptoms in her legs (right worse than left). Records also suggest chronic back pain; however, Ms. Bautch seemed to downplay these symptoms presently.  Experience of frequent headaches/migraines: Denied. Frequent instances of dizziness/vertigo: Denied.  Sensory changes: She has a history of macular degeneration and wears glasses with positive effect. Her daughter reported that she likely also experiences ongoing hearing loss. Other sensory changes/difficulties (e.g., taste or smell) were denied.  Balance/coordination difficulties: Endorsed. In November 2018, Ms. Miano reportedly fell and broke her femur. In August 2019, she again fell and fractured her vertebrae. Since this time, she has largely ambulated in a  wheelchair with success.   Other motor difficulties: Denied.  Sleep History: Estimated hours obtained each night: 7-8 hours.  Difficulties falling asleep: Denied. Difficulties staying asleep: Denied. Feels rested and refreshed upon awakening: Endorsed. Her daughter reported that Ms. Geisen will unintentionally "nod off" at times, generally in the afternoon after lunch.   History of snoring: Endorsed. History of waking up gasping for air: Endorsed. Witnessed breath cessation while asleep: Denied. Her daughter denied Ms. Bernhard ever completing a sleep study in the past.   History of vivid dreaming: Denied. Excessive movement while asleep: Denied. Instances of acting out her dreams: Denied.  Psychiatric/Behavioral Health History: Depression: Denied. Ms. Knickerbocker stated that she feels she is in a "good mood most of the time" and denied to her knowledge ever being diagnosed with a mental health condition in the past. Her daughter did acknowledge the presence of likely significant psychosocial stressors, including the recent passing of her daughter. She denied working with a therapist or counselor in the past. She also denied current or remote suicidal ideation, intent, or plan.  Anxiety: Denied. Mania: Denied. Trauma History: Denied. Visual/auditory hallucinations: Denied. Delusional thoughts: Denied.  Tobacco: Denied. Alcohol: She denied current alcohol consumption as well as a history of problematic alcohol abuse or dependence.  Recreational drugs: Denied. Caffeine: 3-4 cups of 1/2 caffeinated coffee throughout the day.   Family History: Problem Relation Age of Onset  . Anesthesia problems Daughter        post-op N/V  . Alzheimer's disease Mother        Onset in late 70s  . Alzheimer's disease Brother        Onset in late 60s/early 41s   This information was confirmed by Ms. Huard.  Academic/Vocational History: Highest level of educational attainment: 12 years. 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 Math was noted as a potential relative weakness.  History of developmental delay: Denied. History of grade repetition: Denied. Enrollment in special education courses: Denied. History  of LD/ADHD: Denied.  Employment: Retired. She previously works in Press photographer at [employer]   Evaluation Results:   Behavioral Observations: Ms. Duggin was accompanied by her daughter Anne Ng, arrived to her appointment on time, and was appropriately dressed and groomed. She appeared alert and oriented. Observed gait and station could not be assessed as she ambulated in a wheelchair. Gross motor functioning appeared intact upon informal observation and no abnormal movements (e.g., tremors) were noted. Her affect was generally relaxed and positive, but did range appropriately given the subject being discussed during the clinical interview or the task at hand during testing procedures. Spontaneous speech was fluent and word finding difficulties were not observed during the clinical interview. Thought processes were coherent, organized, and normal in content. Insight into her cognitive difficulties appeared poor as she largely denied any ongoing cognitive deficits despite objective testing suggesting otherwise. During testing, sustained attention was appropriate. Task engagement was adequate and she persisted when challenged. Overall, Ms. Woods was cooperative with the clinical interview and subsequent testing procedures.   Adequacy of Effort: The validity of neuropsychological testing is limited by the extent to which the individual being tested may be assumed to have exerted adequate effort during testing. Ms. Luhmann expressed her intention to perform to the best of her abilities and exhibited adequate task engagement and persistence. Scores across stand-alone and embedded performance validity measures were within expectation. As such, the results of the  current evaluation are believed to be a valid representation of Ms. Caton's current cognitive functioning.  Test Results: Ms. Cashwell was disoriented to time during the current evaluation. Points were lost for her stating the incorrect year ("2020"), month ("June"), day of the week, and current location. She was also unable to provide the current date. She stated "my daughter is concerned I'm forgetful" when asked why she is here.   Intellectual abilities based upon educational and vocational attainment were estimated to be in the average range. Premorbid abilities were estimated to be within the average range based upon a single-word reading test.   Processing speed was variable, ranging from the exceptionally low to average normative ranges. Basic attention was average to above average. More complex attention (e.g., working memory) was average. Executive functioning was variable. Performances across tasks assessing cognitive flexibility and response inhibition were exceptionally low. Nonverbal abstract reasoning was below average, while performance on a task assessing safety and judgment was average.  Assessed receptive language abilities were average. Sentence repetition was below average and performance on a semantic knowledge screening test was slightly below expectation. Assessed expressive language (e.g., verbal fluency and confrontation naming) was below average to average.     Assessed visuospatial/visuoconstructional abilities were below average to average.    Learning (i.e., encoding) of novel verbal information was well below average to below average. Spontaneous delayed recall (i.e., retrieval) of previously learned information was exceptionally low to below average. Retention rates were 67% (raw score of 4) across a story learning task, 0% across a list learning task, and 24% across a complex figure drawing task. Performance across recognition tasks was exceptionally low to well below  average across verbal tasks but average across a visual task, suggesting minimal to some evidence for information consolidation.   Results of emotional screening instruments suggested that recent symptoms of generalized anxiety were in the minimal range, while symptoms of depression were within normal limits. A screening instrument assessing recent sleep quality suggested the presence of minimal sleep dysfunction.  Tables of Scores:   Note: This summary  of test scores accompanies the interpretive report and should not be considered in isolation without reference to the appropriate sections in the text. Descriptors are based on appropriate normative data and may be adjusted based on clinical judgment. The terms "impaired" and "within normal limits (WNL)" are used when a more specific level of functioning cannot be determined.       Effort Testing:   DESCRIPTOR       Dot Counting Test: --- --- Within Expectation  RBANS Effort Index: --- --- Within Expectation  WAIS-IV Reliable Digit Span: --- --- Within Expectation       Orientation:      Raw Score Percentile   NAB Orientation, Form 1 23/29 --- ---       Cognitive Screening:           Raw Score Percentile   SLUMS: 23/30 --- ---       RBANS, Form A: Standard Score/ Scaled Score Percentile   Total Score 79 8 Well Below Average  Immediate Memory 76 5 Well Below Average    List Learning 5 5 Well Below Average    Story Memory 6 9 Below Average  Visuospatial/Constructional 89 23 Below Average    Figure Copy 10 50 Average    Line Orientation 13/20 10-16 Below Average  Language 92 30 Average    Picture Naming 9/10 26-50 Average    Semantic Fluency 6 9 Below Average  Attention 94 34 Average    Digit Span 12 75 Above Average    Coding 6 9 Below Average  Delayed Memory 64 1 Exceptionally Low    List Recall 0/10 <2 Exceptionally Low    List Recognition 15/20 <2 Exceptionally Low    Story Recall 6 9 Below Average    Story Recognition 6/12  5-6 Well Below Average    Figure Recall 5 5 Well Below Average    Figure Recognition 5/8 39-57 Average       Intellectual Functioning:           Standard Score Percentile   Test of Premorbid Functioning: 100 50 Average       Attention/Executive Function:          Trail Making Test (TMT): Raw Score (T Score) Percentile     Part A 57 secs.,  0 errors (40) 16 Below Average    Part B DC'D @ 300,  5 errors --- Impaired         Scaled Score Percentile   WAIS-IV Digit Span: 10 50 Average    Forward 11 63 Average    Backward 9 37 Average    Sequencing 9 37 Average       D-KEFS Color-Word Interference Test: Raw Score (Scaled Score) Percentile     Color Naming 54 secs. (3) 1 Exceptionally Low    Word Reading 28 secs. (9) 37 Average    Inhibition 113 secs. (7) 16 Below Average      Total Errors 22 errors (1) <1 Exceptionally Low    Inhibition/Switching Not attempted --- ---      Total Errors --- --- ---       NAB Executive Functions Module, Form 1: T Score Percentile     Judgment 56 73 Average       Language:           Raw Score Percentile   PPVT Screening Instrument: 12/16 --- Below Expectation  Sentence Repetition: 13/22 11 Below Average       Verbal Fluency Test:  Raw Score (Scaled Score) Percentile     Phonemic Fluency (CFL) 27 (9) 37 Average    Category Fluency 26 (7) 16 Below Average  *Based on [employer] Older Normative Studies (MOANS)          NAB Language Module, Form 1: T Score Percentile     Auditory Comprehension 46 34 Average    Naming 25/31 (39) 14 Below Average       Visuospatial/Visuoconstruction:      Raw Score Percentile   Clock Drawing: 9/10 --- Within Normal Limits        Scaled Score Percentile   WAIS-IV Matrix Reasoning: 7 16 Below Average       Mood and Personality:      Raw Score Percentile   Geriatric Depression Scale: 0 --- Within Normal Limits  Geriatric Anxiety Scale: 0 --- Minimal    Somatic 0 --- Minimal    Cognitive 0 --- Minimal     Affective 0 --- Minimal       Additional Questionnaires:      Raw Score Percentile   PROMIS Sleep Disturbance Questionnaire: 9 --- None to Slight   Informed Consent and Coding/Compliance:   Ms. Flitton was provided with a verbal description of the nature and purpose of the present neuropsychological evaluation. Also reviewed were the foreseeable risks and/or discomforts and benefits of the procedure, limits of confidentiality, and mandatory reporting requirements of this provider. The patient was given the opportunity to ask questions and receive answers about the evaluation. Oral consent to participate was provided by the patient.   This evaluation was conducted by Christia Reading, Ph.D., licensed clinical neuropsychologist. Ms. Kennington completed a comprehensive clinical interview with Dr. Melvyn Novas, billed as one unit [phone], and 130 minutes of cognitive testing and scoring, billed as one unit [phone] and three additional units [id number]. Psychometrist Milana Kidney, B.S., assisted Dr. Melvyn Novas with test administration and scoring procedures. As a separate and discrete service, Dr. Melvyn Novas spent a total of 180 minutes in interpretation and report writing billed as one unit [phone] and two units [id number].

## 2020-06-09 ENCOUNTER — Encounter: Payer: Self-pay | Admitting: Psychology

## 2020-06-15 ENCOUNTER — Encounter: Payer: Self-pay | Admitting: Neurology

## 2020-06-15 ENCOUNTER — Other Ambulatory Visit: Payer: Self-pay

## 2020-06-15 ENCOUNTER — Ambulatory Visit (INDEPENDENT_AMBULATORY_CARE_PROVIDER_SITE_OTHER): Payer: Medicare Other | Admitting: Psychology

## 2020-06-15 DIAGNOSIS — F0150 Vascular dementia without behavioral disturbance: Secondary | ICD-10-CM | POA: Diagnosis not present

## 2020-06-15 DIAGNOSIS — F0390 Unspecified dementia without behavioral disturbance: Secondary | ICD-10-CM

## 2020-06-15 NOTE — Patient Instructions (Signed)
Recommendations: A repeat neuropsychological evaluation in 12-18 months (or sooner if functional decline is noted) is recommended to assess the trajectory of future cognitive decline should it occur. This will also aid in future efforts towards improved diagnostic clarity.  Per available records, I cannot confirm that Ms. Speciale is being followed by a neurologist. Given objective evidence suggesting cognitive dysfunction and the chance that symptoms could represent very early stages of a neurodegenerative condition, I will refer her to a neurologist for ongoing treatment.   Neuroimaging in the form of a brain MRI would be beneficial to assess the potential for vascular contributions to her current presentation, as well as to assess for any other potential anatomical correlates for ongoing cognitive dysfunction.   Performance across neurocognitive testing is not a strong predictor of an individual's safety operating a motor vehicle. However, she did demonstrate significant deficits across cognitive flexibility and variability across processing speed. I would recommend that she continue to abstain from driving pending the completion of a formal driving evaluation. Should her family wish to pursue a formalized driving evaluation, they would be encouraged to [employer] in [place], [place] at [phone].Another option would be through [facility]; however, the latter would likely require a referral from a medical doctor. [facility] can be reached directly at [phone].  Should there be a progression of her current deficits over time, Ms. Sigg is unlikely to regain any independent living skills lost. Therefore, it is recommended that she remain as involved as possible in all aspects of household chores, finances, and medication management, with supervision to ensure adequate performance. She will likely benefit from the establishment and maintenance of a routine in  order to maximize her functional abilities over time.  It will be important for Ms. Vanwagoner to have another person with her when in situations where she may need to process information, weigh the pros and cons of different options, and make decisions, in order to ensure that she fully understands and recalls all information to be considered.  If not already done, Ms. Honor and her family may wish to discuss future plans for caretaking and seek out community options for in home/residential care should they become necessary.  Ms. Malveaux is encouraged to attend to lifestyle factors for brain health (e.g., regular physical exercise, good nutrition habits, regular participation in cognitively-stimulating activities, and general stress management techniques), which are likely to have benefits for both emotional adjustment and cognition. Optimal control of vascular risk factors (including safe cardiovascular exercise and adherence to dietary recommendations) is encouraged.  When learning new information, she would benefit from information being broken up into small, manageable pieces. She may also find it helpful to articulate the material in her own words and in a context to promote encoding at the onset of a new task. This material may need to be repeated multiple times to promote encoding.  To address problems with processing speed, she may wish to consider:   -Ensuring that she is alerted when essential material or instructions are being presented   -Adjusting the speed at which new information is presented   -Allowing for more time in comprehending, processing, and responding in conversation  To address problems with executive dysfunction, she may wish to consider:   -Avoiding external distractions when needing to concentrate   -Limiting exposure to fast paced environments with multiple sensory demands   -Writing down complicated information and using checklists   -Attempting and completing one  task at a time (i.e., no  multi-tasking)   -Verbalizing aloud each step of a task to maintain focus   -Reducing the amount of information considered at one time

## 2020-06-15 NOTE — Progress Notes (Signed)
   Neuropsychology Feedback Session Tillie Rung. [place] Department of Neurology  Reason for Referral:   DA MICHELLE a 86 y.o. right-handed Caucasian female referred by Mayra Neer, M.D.,to characterize hercurrent cognitive functioning and assist with diagnostic clarity and treatment planning in the context of subjective cognitive decline and concerns surrounding a neurodegenerative illness.   Feedback:   Ms. Philson completed a comprehensive neuropsychological evaluation on 06/08/2020. Please refer to that encounter for the full report and recommendations. Briefly, results suggested prominent deficits with cognitive flexibility, response inhibition, and learning and memory. Performance variability was also exhibited across processing speed. She also performed well on a task assessing safety and judgment. The etiology of cognitive weaknesses is unclear. Across memory measures, she demonstrated variable but generally low scores across delayed recall trials. She also performed very poorly across verbal recognition trials but strongly across a visual recognition task. While this certainly suggests a weaknesses with information storage, it does not suggest a consistent impairment. I cannot rule out the early beginnings of a neurodegenerative condition, namely Alzheimer's disease, based on the current scores. However, outside of executive dysfunction, performances across other non-memory domains commonly impacted earlier in Alzheimer's disease (e.g., semantic fluency, confrontation naming, and visuospatial abilities) were strong. There is the potential for a vascular contribution to cognitive deficits given the presence of several cardiovascular medical conditions in her history. However, neuroimaging was not available to assess the presence of small vessel ischemic changes or the presence of any remote lacunar infarcts.  Ms. Riehle was accompanied by her daughter during the current  telephone call. They were at her residence while I was within my office. Content of the current session focused on the results of her neuropsychological evaluation. Ms. Sinkler and her daughter were given the opportunity to ask questions and their questions were answered. They was encouraged to reach out should additional questions arise. A copy of her report was mailed at the conclusion of the visit.      17 minutes were spent conducting the current feedback session with Ms. Filosa, billed as one unit [phone].

## 2020-06-29 DIAGNOSIS — H9191 Unspecified hearing loss, right ear: Secondary | ICD-10-CM | POA: Diagnosis not present

## 2020-08-02 DIAGNOSIS — I10 Essential (primary) hypertension: Secondary | ICD-10-CM | POA: Diagnosis not present

## 2020-08-02 DIAGNOSIS — M150 Primary generalized (osteo)arthritis: Secondary | ICD-10-CM | POA: Diagnosis not present

## 2020-08-02 DIAGNOSIS — G3184 Mild cognitive impairment, so stated: Secondary | ICD-10-CM | POA: Diagnosis not present

## 2020-08-02 DIAGNOSIS — D692 Other nonthrombocytopenic purpura: Secondary | ICD-10-CM | POA: Diagnosis not present

## 2020-08-08 DIAGNOSIS — H43813 Vitreous degeneration, bilateral: Secondary | ICD-10-CM | POA: Diagnosis not present

## 2020-08-08 DIAGNOSIS — H353211 Exudative age-related macular degeneration, right eye, with active choroidal neovascularization: Secondary | ICD-10-CM | POA: Diagnosis not present

## 2020-08-08 DIAGNOSIS — H353223 Exudative age-related macular degeneration, left eye, with inactive scar: Secondary | ICD-10-CM | POA: Diagnosis not present

## 2020-08-08 DIAGNOSIS — H35372 Puckering of macula, left eye: Secondary | ICD-10-CM | POA: Diagnosis not present

## 2020-09-19 DIAGNOSIS — H903 Sensorineural hearing loss, bilateral: Secondary | ICD-10-CM | POA: Diagnosis not present

## 2020-09-19 DIAGNOSIS — H6521 Chronic serous otitis media, right ear: Secondary | ICD-10-CM | POA: Diagnosis not present

## 2020-09-19 DIAGNOSIS — Z9009 Acquired absence of other part of head and neck: Secondary | ICD-10-CM | POA: Diagnosis not present

## 2020-09-19 DIAGNOSIS — H90A31 Mixed conductive and sensorineural hearing loss, unilateral, right ear with restricted hearing on the contralateral side: Secondary | ICD-10-CM | POA: Diagnosis not present

## 2020-09-21 ENCOUNTER — Encounter: Payer: Self-pay | Admitting: Neurology

## 2020-09-21 ENCOUNTER — Ambulatory Visit: Payer: Medicare Other | Admitting: Neurology

## 2020-09-21 ENCOUNTER — Other Ambulatory Visit: Payer: Self-pay

## 2020-09-21 VITALS — BP 188/82 | HR 79 | Ht 63.0 in | Wt 150.6 lb

## 2020-09-21 DIAGNOSIS — F0390 Unspecified dementia without behavioral disturbance: Secondary | ICD-10-CM | POA: Diagnosis not present

## 2020-09-21 NOTE — Patient Instructions (Signed)
1. Schedule head CT without contrast  2. Continue all your medications  3. Follow-up in 6-8 months, call for any changes   FALL PRECAUTIONS: Be cautious when walking. Scan the area for obstacles that may increase the risk of trips and falls. When getting up in the mornings, sit up at the edge of the bed for a few minutes before getting out of bed. Consider elevating the bed at the head end to avoid drop of blood pressure when getting up. Walk always in a well-lit room (use night lights in the walls). Avoid area rugs or power cords from appliances in the middle of the walkways. Use a walker or a cane if necessary and consider physical therapy for balance exercise. Get your eyesight checked regularly.  FINANCIAL OVERSIGHT: Supervision, especially oversight when making financial decisions or transactions is also recommended.  HOME SAFETY: Consider the safety of the kitchen when operating appliances like stoves, microwave oven, and blender. Consider having supervision and share cooking responsibilities until no longer able to participate in those. Accidents with firearms and other hazards in the house should be identified and addressed as well.  ABILITY TO BE LEFT ALONE: If patient is unable to contact 911 operator, consider using LifeLine, or when the need is there, arrange for someone to stay with patients. Smoking is a fire hazard, consider supervision or cessation. Risk of wandering should be assessed by caregiver and if detected at any point, supervision and safe proof recommendations should be instituted.  MEDICATION SUPERVISION: Inability to self-administer medication needs to be constantly addressed. Implement a mechanism to ensure safe administration of the medications.  RECOMMENDATIONS FOR ALL PATIENTS WITH MEMORY PROBLEMS: 1. Continue to exercise (Recommend 30 minutes of walking everyday, or 3 hours every week) 2. Increase social interactions - continue going to [place] and enjoy social  gatherings with friends and family 3. Eat healthy, avoid fried foods and eat more fruits and vegetables 4. Maintain adequate blood pressure, blood sugar, and blood cholesterol level. Reducing the risk of stroke and cardiovascular disease also helps promoting better memory. 5. Avoid stressful situations. Live a simple life and avoid aggravations. Organize your time and prepare for the next day in anticipation. 6. Sleep well, avoid any interruptions of sleep and avoid any distractions in the bedroom that may interfere with adequate sleep quality 7. Avoid sugar, avoid sweets as there is a strong link between excessive sugar intake, diabetes, and cognitive impairment The Mediterranean diet has been shown to help patients reduce the risk of progressive memory disorders and reduces cardiovascular risk. This includes eating fish, eat fruits and green leafy vegetables, nuts like almonds and hazelnuts, walnuts, and also use olive oil. Avoid fast foods and fried foods as much as possible. Avoid sweets and sugar as sugar use has been linked to worsening of memory function.  There is always a concern of gradual progression of memory problems. If this is the case, then we may need to adjust level of care according to patient needs. Support, both to the patient and caregiver, should then be put into place.

## 2020-09-21 NOTE — Progress Notes (Signed)
NEUROLOGY CONSULTATION NOTE  LIANNE CARRETO MRN: [id number] DOB: 15-Sep-1930  Referring provider: Dr. Hazle Coca Primary care provider: Dr. Mayra Neer  Reason for consult:  Major Neurocognitive Disorder  Dear Dr Melvyn Novas:  Thank you for your kind referral of Laurie Galloway for consultation of the above symptoms. Although her history is well known to you, please allow me to reiterate it for the purpose of our medical record. The patient was accompanied to the clinic by her daughter Laurie Galloway who also provides collateral information. Records and images were personally reviewed where available.   HISTORY OF PRESENT ILLNESS: This is a pleasant 86 year old right-handed woman with a history of hypertension, bilateral cochlear implants, thoracic and lumbar surgery, presenting for evaluation of dementia. She is accompanied by her daughter Laurie Galloway who helps supplement the history today. Records were reviewed. She underwent Neuropsychological testing in July 2021 indicating prominent deficits with cognitive flexibility, response inhibition, learning and memory. She met criteria for Major Neurocognitive Disorder (ie dementia), mild. Etiology unclear, cannot rule out early beginnings of Alzheimer's disease, there is a potential for vascular contribution.   She feels her memory is pretty good. She lives alone. Laurie Galloway started noticing changes early 2020. She had thoracic fractures in August 2019 and had surgery in November 2019. Laurie Galloway started noticing little things, at that time her daughter with special needs, Laurie Galloway, was living with her, she would look to [place] when asked question requiring recall. Laurie Galloway became sick and moved to [place], which was stressful, then passed away in 12-29-19. A couple of week after, the patient forgot she had died. Until now, she thinks Laurie Galloway is still at [place]. The patient says, "not now, she is in the cemetery," then [place] says just this morning she said she did not  know what to get Laurie Galloway for her birthday in December. She denies missing medications, Laurie Galloway fills her pillbox and she has an aide coming 4-5 hours 5-6 days a week. She tends to forget her evening pills. Laurie Galloway took over finances after her surgery in 2019 then gave them back to her, however she started forgetting bills and Laurie Galloway took over again. She has been a victim of telemarketing scams, Laurie Galloway has had to cancel her credit card a couple of times 2-3 months ago. She has not been driving since back surgery in 2019. She is independent with dressing and bathing, she gets around the house by scooting around in her wheelchair and does transfers independently. She denies any falls since 2019. No hallucinations or paranoia. Mood is pretty good most of the time. Sleep is good. She is on Donepezil 60m daily and Memantine 150mBID without side effects.  She denies any headaches, dizziness, diplopia, dysarthria, dysphagia, neck/back pain, focal numbness/tingling/weakness, bowel/bladder dysfunction. No anosmia, tremors, no falls. She has pain behind her thighs. Her mother and brother had dementia. No significant head injuries or alcohol use.    PAST MEDICAL HISTORY: Past Medical History:  Diagnosis Date  . Aortic atherosclerosis 10/17/2018  . Arthritis    hands  . Carpal tunnel syndrome of left wrist 08/2016  . Chronic back pain 01/07/2012  . Closed left hip fracture 10/13/2017  . Cochlear implant in place 10/13/2017  . Degeneration of lumbar intervertebral disc 10/06/2018  . Dental crowns present   . Epigastric pain 01/07/2012  . Gastrointestinal bleeding 01/07/2012  . HTN (hypertension) 01/07/2012   states under control with meds., has been on med. x "long time"  . Hyponatremia 10/17/2018  .  Lumbar post-laminectomy syndrome 10/06/2018  . Major neurocognitive disorder, unclear etiology 06/08/2020  . Pain in left knee 01/09/2018  . PONV (postoperative nausea and vomiting)   . Small bowel obstruction  01/07/2012  . Subclinical hyperthyroidism 06/18/2013  . Tachycardia 01/07/2012    PAST SURGICAL HISTORY: Past Surgical History:  Procedure Laterality Date  . ANTERIOR AND POSTERIOR VAGINAL REPAIR  11/08/2002  . APPENDECTOMY    . BOWEL RESECTION  01/08/2012   Procedure: SMALL BOWEL RESECTION;  Surgeon: Imogene Burn. Georgette Dover, MD;  Location: [facility];  Service: General;  Laterality: N/A;  . CARPAL TUNNEL RELEASE Left 08/29/2016   Procedure: CARPAL TUNNEL RELEASE, left;  Surgeon: Leanora Cover, MD;  Location: [facility];  Service: Orthopedics;  Laterality: Left;  . CATARACT EXTRACTION W/ INTRAOCULAR LENS IMPLANT Left 01/09/2011  . CATARACT EXTRACTION W/ INTRAOCULAR LENS IMPLANT Right 03/06/2011  . COLONOSCOPY WITH PROPOFOL N/A 08/24/2013   Procedure: COLONOSCOPY WITH PROPOFOL;  Surgeon: Garlan Fair, MD;  Location: [facility];  Service: Endoscopy;  Laterality: N/A;  . ESOPHAGOGASTRODUODENOSCOPY (EGD) WITH PROPOFOL N/A 08/24/2013   Procedure: ESOPHAGOGASTRODUODENOSCOPY (EGD) WITH PROPOFOL;  Surgeon: Garlan Fair, MD;  Location: [facility];  Service: Endoscopy;  Laterality: N/A;  . EXTERNAL EAR SURGERY Bilateral    stapedectomy   . INTRAMEDULLARY (IM) NAIL INTERTROCHANTERIC Left 10/14/2017   Procedure: INTRAMEDULLARY (IM) NAIL INTERTROCHANTRIC;  Surgeon: Paralee Cancel, MD;  Location: [facility];  Service: Orthopedics;  Laterality: Left;  . LAMINECTOMY WITH POSTERIOR LATERAL ARTHRODESIS LEVEL 4 N/A 10/19/2018   Procedure: Thoracic ten-Lumbar five posterior lateral fusion with pedicle screws, Local autograft and allograft, Thoracic eleven and Thoracic twelve laminectomy with transpedicular fracture reduction;  Surgeon: Earnie Larsson, MD;  Location: [facility];  Service: Neurosurgery;  Laterality: N/A;  . LAPAROSCOPIC ASSISTED VAGINAL HYSTERECTOMY  11/08/2002  . LAPAROSCOPIC BILATERAL SALPINGO OOPHERECTOMY Bilateral 11/08/2002  . LAPAROSCOPIC LYSIS OF ADHESIONS  11/08/2002  . LAPAROTOMY  01/08/2012    Procedure: EXPLORATORY LAPAROTOMY;  Surgeon: Imogene Burn. Georgette Dover, MD;  Location: [facility];  Service: General;  Laterality: N/A;  . LUMBAR FUSION  03/19/2010   L3-4, L4-5  . LUMBAR LAMINECTOMY/DECOMPRESSION MICRODISCECTOMY  03/19/2010   L3-4, L4-5  . SHOULDER ARTHROSCOPY W/ ROTATOR CUFF REPAIR    . TONSILLECTOMY     age 81  . VERTEBROPLASTY N/A 10/19/2018   Procedure: Thoracic nine prophylactic vertebroplasty;  Surgeon: Earnie Larsson, MD;  Location: [facility];  Service: Neurosurgery;  Laterality: N/A;    MEDICATIONS: Current Outpatient Medications on File Prior to Visit  Medication Sig Dispense Refill  . acetaminophen (TYLENOL) 325 MG tablet Take 2 tablets (650 mg total) by mouth every 4 (four) hours as needed for mild pain, fever or headache ((score 1 to 3) or temp > 100.5). 12 tablet 1  . betamethasone valerate (VALISONE) 0.1 % cream Apply 1 application topically 2 (two) times daily as needed (itching).     . Calcium Carb-Cholecalciferol (CALCIUM+D3) 600-800 MG-UNIT TABS Take 1 tablet by mouth 2 (two) times daily with a meal.    . Cholecalciferol (VITAMIN D) 2000 units tablet Take 2,000 Units by mouth daily.    . clobetasol cream (TEMOVATE) 0.17 % Apply 1 application topically 2 (two) times daily.  2  . denosumab (PROLIA) 60 MG/ML SOSY injection Inject 60 mg into the skin every 6 (six) months.    Marland Kitchen losartan (COZAAR) 100 MG tablet Take 100 mg by mouth every morning.     . methocarbamol (ROBAXIN) 500 MG tablet Take 1 tablet (500  mg total) by mouth 4 (four) times daily. 60 tablet 1  . Misc Natural Products (GLUCOSAMINE CHONDROITIN TRIPLE PO) Take 1 capsule by mouth 2 (two) times daily.     . Multiple Vitamin (MULTIVITAMIN) tablet Take 1 tablet by mouth daily with supper.     . Multiple Vitamins-Minerals (PRESERVISION AREDS 2 PO) Take 1 tablet by mouth 2 (two) times daily.    . Omega-3 Fatty Acids (FISH OIL PO) Take 1 capsule by mouth daily.     . ondansetron (ZOFRAN) 4 MG tablet Take 1 tablet (4 mg  total) by mouth every 6 (six) hours as needed for nausea or vomiting. 20 tablet 0  . oxyCODONE (OXY IR/ROXICODONE) 5 MG immediate release tablet Take 2 tablets (10 mg total) by mouth every 3 (three) hours as needed for moderate pain or severe pain ((score 7 to 10)). 15 tablet 0  . Polyethyl Glycol-Propyl Glycol (SYSTANE) 0.4-0.3 % SOLN Place 1 drop into both eyes 4 (four) times daily as needed (dry eyes).     . sodium chloride 1 g tablet Take 1 tablet (1 g total) by mouth 3 (three) times daily with meals. 60 tablet 0  . verapamil (VERELAN PM) 360 MG 24 hr capsule Take 360 mg by mouth every morning.      No current facility-administered medications on file prior to visit.    ALLERGIES: Allergies  Allergen Reactions  . Penicillins Hives and Rash    Many years ago Has patient had a PCN reaction causing immediate rash, facial/tongue/throat swelling, SOB or lightheadedness with hypotension: No Has patient had a PCN reaction causing severe rash involving mucus membranes or skin necrosis: No Has patient had a PCN reaction that required hospitalization: No Has patient had a PCN reaction occurring within the last 10 years: No If all of the above answers are "NO", then may proceed with Cephalosporin use.   . Sulfa Antibiotics Rash    FAMILY HISTORY: Family History  Problem Relation Age of Onset  . Anesthesia problems Daughter        post-op N/V  . Alzheimer's disease Mother        Onset in late 90s  . Alzheimer's disease Brother        Onset in late 60s/early 16s    SOCIAL HISTORY: Social History   Socioeconomic History  . Marital status: Widowed    Spouse name: Not on file  . Number of children: Not on file  . Years of education: 20  . Highest education level: High school graduate  Occupational History  . Occupation: Retired  Tobacco Use  . Smoking status: Never Smoker  . Smokeless tobacco: Never Used  Vaping Use  . Vaping Use: Never used  Substance and Sexual Activity  .  Alcohol use: No  . Drug use: No  . Sexual activity: Never  Other Topics Concern  . Not on file  Social History Narrative  . Not on file   Social Determinants of Health   Financial Resource Strain:   . Difficulty of Paying Living Expenses: Not on file  Food Insecurity:   . Worried About Charity fundraiser in the Last Year: Not on file  . Ran Out of Food in the Last Year: Not on file  Transportation Needs:   . Lack of Transportation (Medical): Not on file  . Lack of Transportation (Non-Medical): Not on file  Physical Activity:   . Days of Exercise per Week: Not on file  . Minutes of Exercise per  Session: Not on file  Stress:   . Feeling of Stress : Not on file  Social Connections:   . Frequency of Communication with Friends and Family: Not on file  . Frequency of Social Gatherings with Friends and Family: Not on file  . Attends Religious Services: Not on file  . Active Member of Clubs or Organizations: Not on file  . Attends Archivist Meetings: Not on file  . Marital Status: Not on file  Intimate Partner Violence:   . Fear of Current or Ex-Partner: Not on file  . Emotionally Abused: Not on file  . Physically Abused: Not on file  . Sexually Abused: Not on file     PHYSICAL EXAM: Vitals:   09/21/20 0851  BP: (!) 188/82  Pulse: 79  SpO2: 97%   General: No acute distress Head:  Normocephalic/atraumatic Skin/Extremities: No rash, no edema Neurological Exam: Mental status: alert and awake. No dysarthria or aphasia, Fund of knowledge is appropriate.  Recent and remote memory are impaired. Attention and concentration are normal. Cranial nerves: CN I: not tested CN II: pupils equal, round and reactive to light, visual fields intact CN III, IV, VI:  full range of motion, no nystagmus, no ptosis CN V: facial sensation intact CN VII: upper and lower face symmetric CN VIII: hearing intact to conversation CN IX, X: gag intact, uvula midline CN XI:  sternocleidomastoid and trapezius muscles intact CN XII: tongue midline Bulk & Tone: normal, no fasciculations. Motor: 5/5 throughout with no pronator drift. Sensation: intact to light touch, cold, pin. Decreased vibration sense to knees bilaterally.  Deep Tendon Reflexes: +1 throughout Cerebellar: no incoordination on finger to nose testing Gait: not tested, patient overall wheelchair-bound, can take a few steps with walker (did not bring walker today) Tremor: none   IMPRESSION: This is a pleasant 86 year old right-handed woman with a history of hypertension, bilateral cochlear implants, spinal surgery, presenting for evaluation of dementia. Neuropsychological testing in July 2021 indicated Major Neurocognitive disorder (ie dementia), etiology unclear, cannot rule out early beginnings of Alzheimer's disease, potential for vascular contribution. Head CT without contrast will be ordered to assess for underlying structural abnormality. She is unable to have MRI due to cochlear implants. She is already on Donepezil 26m daily and Memantine 157mBID without side effects, expectations from medication discussed. We discussed diagnosis and prognosis, including continued monitoring for increased help at home. We discussed the importance of planning for the future, medication supervision. She does not drive. Follow-up in 6-8 months, they know to call for any changes.   Thank you for allowing me to participate in the care of this patient. Please do not hesitate to call for any questions or concerns.   KaEllouise NewerM.D.  CC: Dr. ShBrigitte Pulse

## 2020-10-05 ENCOUNTER — Other Ambulatory Visit: Payer: Medicare Other

## 2020-10-12 ENCOUNTER — Other Ambulatory Visit: Payer: Self-pay

## 2020-10-12 ENCOUNTER — Ambulatory Visit
Admission: RE | Admit: 2020-10-12 | Discharge: 2020-10-12 | Disposition: A | Discharge status: Home / Self Care | Payer: Medicare Other | Source: Ambulatory Visit | Attending: Neurology | Admitting: Neurology

## 2020-10-12 DIAGNOSIS — R413 Other amnesia: Secondary | ICD-10-CM | POA: Diagnosis not present

## 2020-10-12 DIAGNOSIS — F0390 Unspecified dementia without behavioral disturbance: Secondary | ICD-10-CM

## 2020-10-13 ENCOUNTER — Telehealth: Payer: Self-pay

## 2020-10-13 NOTE — Telephone Encounter (Signed)
Spoke with pt daughter informed her that CT did not show any evidence of tumor, stroke, or bleed. It showed age-related changes and hardening of the small blood vessels in the brain. Important to continue control of BP, cholesterol, sugar levels.

## 2020-10-13 NOTE — Telephone Encounter (Signed)
-----   Message from Cameron Sprang, MD sent at 10/13/2020  1:13 PM EST ----- Pls let patient/daughter know the head CT did not show any evidence of tumor, stroke, or bleed. It showed age-related changes and hardening of the small blood vessels in the brain. Important to continue control of BP, cholesterol, sugar levels. Thanks

## 2020-10-17 DIAGNOSIS — H6521 Chronic serous otitis media, right ear: Secondary | ICD-10-CM | POA: Diagnosis not present

## 2020-11-14 ENCOUNTER — Ambulatory Visit
Admission: RE | Admit: 2020-11-14 | Discharge: 2020-11-14 | Disposition: A | Discharge status: Home / Self Care | Payer: Medicare Other | Source: Ambulatory Visit | Attending: Family Medicine | Admitting: Family Medicine

## 2020-11-14 ENCOUNTER — Other Ambulatory Visit: Payer: Self-pay

## 2020-11-14 DIAGNOSIS — Z780 Asymptomatic menopausal state: Secondary | ICD-10-CM | POA: Diagnosis not present

## 2020-11-14 DIAGNOSIS — M85852 Other specified disorders of bone density and structure, left thigh: Secondary | ICD-10-CM | POA: Diagnosis not present

## 2020-11-14 DIAGNOSIS — M810 Age-related osteoporosis without current pathological fracture: Secondary | ICD-10-CM | POA: Diagnosis not present

## 2020-11-16 DIAGNOSIS — Z9622 Myringotomy tube(s) status: Secondary | ICD-10-CM | POA: Diagnosis not present

## 2020-11-16 DIAGNOSIS — H90A31 Mixed conductive and sensorineural hearing loss, unilateral, right ear with restricted hearing on the contralateral side: Secondary | ICD-10-CM | POA: Diagnosis not present

## 2020-11-16 DIAGNOSIS — H6521 Chronic serous otitis media, right ear: Secondary | ICD-10-CM | POA: Diagnosis not present

## 2020-11-28 DIAGNOSIS — H43813 Vitreous degeneration, bilateral: Secondary | ICD-10-CM | POA: Diagnosis not present

## 2020-11-28 DIAGNOSIS — H353211 Exudative age-related macular degeneration, right eye, with active choroidal neovascularization: Secondary | ICD-10-CM | POA: Diagnosis not present

## 2020-11-28 DIAGNOSIS — H353223 Exudative age-related macular degeneration, left eye, with inactive scar: Secondary | ICD-10-CM | POA: Diagnosis not present

## 2020-11-28 DIAGNOSIS — H35372 Puckering of macula, left eye: Secondary | ICD-10-CM | POA: Diagnosis not present

## 2021-01-31 DIAGNOSIS — M150 Primary generalized (osteo)arthritis: Secondary | ICD-10-CM | POA: Diagnosis not present

## 2021-01-31 DIAGNOSIS — M810 Age-related osteoporosis without current pathological fracture: Secondary | ICD-10-CM | POA: Diagnosis not present

## 2021-01-31 DIAGNOSIS — I10 Essential (primary) hypertension: Secondary | ICD-10-CM | POA: Diagnosis not present

## 2021-01-31 DIAGNOSIS — Z0000 Encounter for general adult medical examination without abnormal findings: Secondary | ICD-10-CM | POA: Diagnosis not present

## 2021-03-27 DIAGNOSIS — H353223 Exudative age-related macular degeneration, left eye, with inactive scar: Secondary | ICD-10-CM | POA: Diagnosis not present

## 2021-03-27 DIAGNOSIS — H35372 Puckering of macula, left eye: Secondary | ICD-10-CM | POA: Diagnosis not present

## 2021-03-27 DIAGNOSIS — H353211 Exudative age-related macular degeneration, right eye, with active choroidal neovascularization: Secondary | ICD-10-CM | POA: Diagnosis not present

## 2021-03-27 DIAGNOSIS — H43813 Vitreous degeneration, bilateral: Secondary | ICD-10-CM | POA: Diagnosis not present

## 2021-05-03 DIAGNOSIS — J22 Unspecified acute lower respiratory infection: Secondary | ICD-10-CM | POA: Diagnosis not present

## 2021-05-10 DIAGNOSIS — J22 Unspecified acute lower respiratory infection: Secondary | ICD-10-CM | POA: Diagnosis not present

## 2021-05-10 DIAGNOSIS — I10 Essential (primary) hypertension: Secondary | ICD-10-CM | POA: Diagnosis not present

## 2021-05-24 ENCOUNTER — Ambulatory Visit: Payer: Medicare Other | Admitting: Neurology

## 2021-07-02 DEATH — deceased
